# Patient Record
Sex: Male | Born: 1955 | Race: Black or African American | Hispanic: No | Marital: Married | Smoking: Never smoker
Health system: Southern US, Community
[De-identification: ages and names within clinical notes are randomized; demographics above are authoritative.]

## PROBLEM LIST (undated history)

## (undated) DIAGNOSIS — I639 Cerebral infarction, unspecified: Secondary | ICD-10-CM

## (undated) SURGERY — Surgical Case
Anesthesia: *Unknown

---

## 1999-10-12 ENCOUNTER — Emergency Department (HOSPITAL_COMMUNITY): Admission: EM | Admit: 1999-10-12 | Discharge: 1999-10-12 | Payer: Self-pay | Admitting: Emergency Medicine

## 1999-11-10 ENCOUNTER — Emergency Department (HOSPITAL_COMMUNITY): Admission: EM | Admit: 1999-11-10 | Discharge: 1999-11-10 | Payer: Self-pay | Admitting: *Deleted

## 2002-06-14 ENCOUNTER — Emergency Department (HOSPITAL_COMMUNITY): Admission: EM | Admit: 2002-06-14 | Discharge: 2002-06-14 | Payer: Self-pay | Admitting: Emergency Medicine

## 2002-06-14 ENCOUNTER — Encounter: Payer: Self-pay | Admitting: Emergency Medicine

## 2005-04-01 ENCOUNTER — Emergency Department (HOSPITAL_COMMUNITY): Admission: EM | Admit: 2005-04-01 | Discharge: 2005-04-01 | Payer: Self-pay | Admitting: Emergency Medicine

## 2005-10-01 ENCOUNTER — Emergency Department (HOSPITAL_COMMUNITY): Admission: EM | Admit: 2005-10-01 | Discharge: 2005-10-01 | Payer: Self-pay | Admitting: Emergency Medicine

## 2005-10-04 ENCOUNTER — Encounter: Admission: RE | Admit: 2005-10-04 | Discharge: 2005-10-04 | Payer: Self-pay | Admitting: General Surgery

## 2005-10-07 ENCOUNTER — Ambulatory Visit (HOSPITAL_BASED_OUTPATIENT_CLINIC_OR_DEPARTMENT_OTHER): Admission: RE | Admit: 2005-10-07 | Discharge: 2005-10-07 | Payer: Self-pay | Admitting: General Surgery

## 2008-07-14 ENCOUNTER — Emergency Department (HOSPITAL_COMMUNITY): Admission: EM | Admit: 2008-07-14 | Discharge: 2008-07-14 | Payer: Self-pay | Admitting: Emergency Medicine

## 2008-11-21 ENCOUNTER — Encounter: Admission: RE | Admit: 2008-11-21 | Discharge: 2008-11-21 | Payer: Self-pay | Admitting: Family Medicine

## 2010-11-17 ENCOUNTER — Inpatient Hospital Stay (INDEPENDENT_AMBULATORY_CARE_PROVIDER_SITE_OTHER)
Admission: RE | Admit: 2010-11-17 | Discharge: 2010-11-17 | Disposition: A | Discharge status: Home / Self Care | Payer: Self-pay | Source: Ambulatory Visit | Attending: Family Medicine | Admitting: Family Medicine

## 2010-11-17 DIAGNOSIS — L089 Local infection of the skin and subcutaneous tissue, unspecified: Secondary | ICD-10-CM

## 2010-11-17 DIAGNOSIS — G9009 Other idiopathic peripheral autonomic neuropathy: Secondary | ICD-10-CM

## 2011-02-01 NOTE — Op Note (Signed)
NAME:  Vincent Oconnell, Vincent Oconnell NO.:  [id number]   MEDICAL RECORD NO.:  [id number]          PATIENT TYPE:  AMB   LOCATION:  [facility]                         FACILITY:  [facility]   PHYSICIAN:  Anselm Pancoast. Weatherly, M.D.DATE OF BIRTH:  Dec 27, 1955   DATE OF PROCEDURE:  10/07/2005  DATE OF DISCHARGE:                                 OPERATIVE REPORT   PREOPERATIVE DIAGNOSIS:  Right inguinal hernia, probably indirect.   POSTOPERATIVE DIAGNOSIS:  Right inguinal hernia, indirect.   OPERATION:  Right inguinal herniorrhaphy with mesh reinforcement.   ANESTHESIA:  General with local supplementation.   SURGEON:  Leodis Alcocer. Zachery Dakins, M.D.   HISTORY:  Vincent Oconnell is a 55 year old male who is actually a chef over  at the [employer] and was referred to me by his medical doctor with the  following history:  He started having discomfort about two months ago and  has been evaluated in one of the urgent care facilities.  They referred him  to Dr. Maple Hudson on [facility], who checked his cholesterol and put him  on Lipitor.  He had questionable kidney stones, and he saw Dr. Larey Dresser after they found microscopic blood in his urine.  He then was seen  in the ER with right groin pain by Dr. Adriana Simas, who noted he had a definite  right inguinal hernia and referred him to me, and I saw him last week.  He  said that when he is on his feet, he starts getting pain and discomfort and  a bulge in the right groin.  Maybe he had the bulge two months ago, but the  pain certainly has been present for about two months.  I recommended that we  proceed on with a right inguinal herniorrhaphy.  He does not smoke or drink.  He does pump weights.  He says he stopped that because of the discomfort.  He desires to have the hernia repaired, and we plan on keeping him out of  work probably 1-2 weeks.  He had a CAT scan approximately a month ago at  [place] and that did not show a hernia.  It showed the  kidney stones  bilaterally.   The patient was taken to the operating suite.  Induction of general  anesthesia via EOA tube.  The abdomen was shaved, prepped with Betadine  solution, and draped in a sterile manner.  The patient was identified,  permit signed, etc.  He had been marked on the right side.  I made a right  inguinal incision.  Sharp dissection down through the skin and subcutaneous  tissue.  A couple of small veins were identified, clamped and ligated after  being divided and then the external oblique aponeurosis was identified.  I  opened it through the external ring.  The ilioinguinal nerve was very small.  I elevated the cord structures with a Penrose.  The patient has an indirect  hernia that extended about 4 inches to the proximal cord structures, and  this was separated carefully so that a high sac ligation could be performed.  The vas and artery  and vessels were protected, and the high sac ligation  done under direct vision with an 0 Vicryl and then the short stack removed,  but I did not send it for pathology exam.  Next, he has kind of a weakened  floor, not really a direct inguinal hernia, but I repaired that with kind of  running 2-0 Prolene starting at the symphysis pubis, kind of running up and  reinforcing the internal ring and going back and tying the two ends  together.  Then a piece of mesh shaped like a sail slit laterally was  sutured to the four.  This was under the ilioinguinal nerve.  Starting at  the symphysis pubis, sutured to the inguinal ligament with a running 2-0  Vicryl.  The two tails were sutured together laterally.  I sutured it down  to the rectus fascia and the internal oblique with interrupted sutures of 2-  0 Prolene, and then closed the external oblique with a running 2-0 Vicryl.  Scarpa's fascia was closed with 3-0 Vicryl and a 4-0 Dexon subcuticular and  Benzoin and Steri-Strips on the skin.  I had blocked him with an  ilioinguinal nerve  and then also with Marcaine with Adrenaline in the floor  area, and then the subcutaneous area, all totaling about 25 cc of solution  used.  Patient was sent to the recovery room in a stable postoperative  condition after the Benzoin and Steri-Strips had been applied, and his  testicle was in its normal position.  He will be released after a short stay  in the recovery room with instructions to see me in approximately a week,  and whether he returns to work in a week or two weeks is determined by how  much discomfort he is having.  He will be under light duty for probably  about four weeks total.           ______________________________  Anselm Pancoast. Zachery Dakins, M.D.     WJW/MEDQ  D:  10/07/2005  T:  10/07/2005  Job:  [id number]   cc:   Dr. Dale [address]

## 2011-11-04 DIAGNOSIS — Z0000 Encounter for general adult medical examination without abnormal findings: Secondary | ICD-10-CM | POA: Insufficient documentation

## 2016-05-04 ENCOUNTER — Encounter (HOSPITAL_COMMUNITY): Payer: Self-pay | Admitting: *Deleted

## 2016-05-04 ENCOUNTER — Emergency Department (HOSPITAL_COMMUNITY)
Admission: EM | Admit: 2016-05-04 | Discharge: 2016-05-05 | Disposition: A | Discharge status: Home / Self Care | Payer: Worker's Compensation | Attending: Emergency Medicine | Admitting: Emergency Medicine

## 2016-05-04 DIAGNOSIS — Y9389 Activity, other specified: Secondary | ICD-10-CM | POA: Diagnosis not present

## 2016-05-04 DIAGNOSIS — S61001A Unspecified open wound of right thumb without damage to nail, initial encounter: Secondary | ICD-10-CM | POA: Diagnosis present

## 2016-05-04 DIAGNOSIS — Z23 Encounter for immunization: Secondary | ICD-10-CM | POA: Insufficient documentation

## 2016-05-04 DIAGNOSIS — S61209A Unspecified open wound of unspecified finger without damage to nail, initial encounter: Secondary | ICD-10-CM

## 2016-05-04 DIAGNOSIS — Y92511 Restaurant or cafe as the place of occurrence of the external cause: Secondary | ICD-10-CM | POA: Diagnosis not present

## 2016-05-04 DIAGNOSIS — W260XXA Contact with knife, initial encounter: Secondary | ICD-10-CM | POA: Insufficient documentation

## 2016-05-04 DIAGNOSIS — Y999 Unspecified external cause status: Secondary | ICD-10-CM | POA: Diagnosis not present

## 2016-05-04 MED ORDER — CEPHALEXIN 250 MG PO CAPS
500.0000 mg | ORAL_CAPSULE | Freq: Once | ORAL | Status: AC
  Administered 2016-05-05: 500 mg via ORAL
  Filled 2016-05-04: qty 2

## 2016-05-04 MED ORDER — CEPHALEXIN 500 MG PO CAPS: 500.0000 mg | ORAL_CAPSULE | Freq: Four times a day (QID) | ORAL | 0 refills | Status: DC

## 2016-05-04 MED ORDER — LIDOCAINE HCL (PF) 1 % IJ SOLN
5.0000 mL | Freq: Once | INTRAMUSCULAR | Status: AC
  Administered 2016-05-04: 5 mL via INTRADERMAL
  Filled 2016-05-04: qty 5

## 2016-05-04 MED ORDER — BACITRACIN ZINC 500 UNIT/GM EX OINT
1.0000 "application " | TOPICAL_OINTMENT | Freq: Once | CUTANEOUS | Status: AC
  Administered 2016-05-05: 1 via TOPICAL
  Filled 2016-05-04: qty 0.9

## 2016-05-04 MED ORDER — TETANUS-DIPHTH-ACELL PERTUSSIS 5-2.5-18.5 LF-MCG/0.5 IM SUSP
0.5000 mL | Freq: Once | INTRAMUSCULAR | Status: AC
  Administered 2016-05-05: 0.5 mL via INTRAMUSCULAR
  Filled 2016-05-04: qty 0.5

## 2016-05-04 MED ORDER — LIDOCAINE-EPINEPHRINE-TETRACAINE (LET) SOLUTION
3.0000 mL | Freq: Once | NASAL | Status: AC
  Administered 2016-05-04: 3 mL via TOPICAL
  Filled 2016-05-04: qty 3

## 2016-05-04 NOTE — ED Provider Notes (Signed)
[facility]-EMERGENCY DEPT Provider Note   CSN: [id number] Arrival date & time: 05/04/16  2107  By signing my name below, I, Rosario AdieWilliam Andrew Hiatt, attest that this documentation has been prepared under the direction and in the presence of [employer]ole , PA-C.  Electronically Signed: Rosario AdieWilliam Andrew Hiatt, ED Scribe. 05/04/16. 9:48 PM.  History   Chief Complaint Chief Complaint  Patient presents with  . Laceration   The history is provided by the patient. No language interpreter was used.   HPI Comments: Glori BickersWilliam Dinkel is a right hand dominant 60 y.o. male with no pertinent PMhx, who presents to the Emergency Department complaining of laceration to the right first digit onset ~45 minutes PTA. Bleeding is controlled with pressure. Pt reports that he was cutting pork tenderloin with a sharp knife, when he accidentally brought the knife down onto the digit, sustaining the laceration. His pain to the area is exacerbated with palpation. No hx of DM. Denies any other symptoms. Tetanus is not UTD.   History reviewed. No pertinent past medical history.  There are no active problems to display for this patient.  History reviewed. No pertinent surgical history.  Home Medications    Prior to Admission medications   Medication Sig Start Date End Date Taking? Authorizing Provider  cephALEXin (KEFLEX) 500 MG capsule Take 1 capsule (500 mg total) by mouth 4 (four) times daily. 05/04/16   Joni ReiningNicole , PA-C   Family History No family history on file.  Social History Social History  Substance Use Topics  . Smoking status: Never Smoker  . Smokeless tobacco: Never Used  . Alcohol use No   Allergies   Review of patient's allergies indicates no known allergies.  Review of Systems Review of Systems A complete 10 system review of systems was obtained and all systems are negative except as noted in the HPI and PMH.   Physical Exam Updated Vital Signs BP 131/95 (BP Location: Right Arm)   Pulse  75   Temp 98.4 F (36.9 C)   Resp 18   Ht 5\' 7"  (1.702 m)   Wt 83.9 kg   SpO2 96%   BMI 28.98 kg/m   Physical Exam  Constitutional: He appears well-developed and well-nourished.  HENT:  Head: Normocephalic.  Eyes: Conjunctivae are normal.  Cardiovascular: Normal rate.   Pulmonary/Chest: Effort normal. No respiratory distress.  Abdominal: He exhibits no distension.  Musculoskeletal: Normal range of motion.  Partial fingertip avulsion to tip of left thumb, full range of motion, he also has a smaller puncture wound just proximal on the volar aspect of the distal phalanx. Bleeding is controlled, no gross contamination.  Neurological: He is alert.  Skin: Skin is warm and dry.  Psychiatric: He has a normal mood and affect. His behavior is normal.  Nursing note and vitals reviewed.  ED Treatments / Results  DIAGNOSTIC STUDIES: Oxygen Saturation is 98% on RA, normal by my interpretation.   COORDINATION OF CARE: 9:48 PM-Discussed next steps with pt. Pt verbalized understanding and is agreeable with the plan.   Radiology No results found.  Procedures Procedures (including critical care time)  Medications Ordered in ED Medications  lidocaine-EPINEPHrine-tetracaine (LET) solution (3 mLs Topical Given 05/04/16 2216)  lidocaine (PF) (XYLOCAINE) 1 % injection 5 mL (5 mLs Intradermal Given 05/04/16 2216)  cephALEXin (KEFLEX) capsule 500 mg (500 mg Oral Given 05/05/16 0006)  bacitracin ointment 1 application (1 application Topical Given 05/05/16 0007)  Tdap (BOOSTRIX) injection 0.5 mL (0.5 mLs Intramuscular Given 05/05/16 0006)  Initial Impression / Assessment and Plan / ED Course  I have reviewed the triage vital signs and the nursing notes.  Pertinent labs & imaging results that were available during my care of the patient were reviewed by me and considered in my medical decision making (see chart for details).  Clinical Course    Vitals:   05/04/16 2128 05/04/16 2130 05/05/16  0007  BP: 130/89  131/95  Pulse: 73  75  Resp: 16  18  Temp: 98.4 F (36.9 C)    SpO2: 98%  96%  Weight:  83.9 kg   Height:  5\' 7"  (1.702 m)     Medications  lidocaine-EPINEPHrine-tetracaine (LET) solution (3 mLs Topical Given 05/04/16 2216)  lidocaine (PF) (XYLOCAINE) 1 % injection 5 mL (5 mLs Intradermal Given 05/04/16 2216)  cephALEXin (KEFLEX) capsule 500 mg (500 mg Oral Given 05/05/16 0006)  bacitracin ointment 1 application (1 application Topical Given 05/05/16 0007)  Tdap (BOOSTRIX) injection 0.5 mL (0.5 mLs Intramuscular Given 05/05/16 0006)    Glori BickersWilliam Cuadras is 60 y.o. male presenting with Partial fingertip avulsion to left thumb, and he would not benefit from wound closure. Wound is cleaned and closed, patient will be started on antibiotics.  Evaluation does not show pathology that would require ongoing emergent intervention or inpatient treatment. Pt is hemodynamically stable and mentating appropriately. Discussed findings and plan with patient/guardian, who agrees with care plan. All questions answered. Return precautions discussed and outpatient follow up given.      Final Clinical Impressions(s) / ED Diagnoses   Final diagnoses:  Fingertip avulsion, initial encounter    I personally performed the services described in this documentation, which was scribed in my presence. The recorded information has been reviewed and is accurate.    Wynetta Emeryicole , PA-C 05/05/16 0035    Melene Planan Floyd, DO 05/05/16 1506

## 2016-05-04 NOTE — Discharge Instructions (Signed)
Wash the affected area with soap and water and apply a thin layer of topical antibiotic ointment. Do this every 12 hours.  ° °Do not use rubbing alcohol or hydrogen peroxide.                       ° °Look for signs of infection: if you see redness, if the area becomes warm, if pain increases sharply, there is discharge (pus), if red streaks appear or you develop fever or vomiting, RETURN immediately to the Emergency Department  for a recheck.  ° °

## 2016-05-04 NOTE — ED Triage Notes (Signed)
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

## 2019-06-23 ENCOUNTER — Other Ambulatory Visit: Payer: Self-pay

## 2019-06-23 ENCOUNTER — Emergency Department (HOSPITAL_COMMUNITY)
Admission: EM | Admit: 2019-06-23 | Discharge: 2019-06-23 | Disposition: A | Discharge status: Home / Self Care | Payer: No Typology Code available for payment source | Attending: Emergency Medicine | Admitting: Emergency Medicine

## 2019-06-23 ENCOUNTER — Encounter (HOSPITAL_COMMUNITY): Payer: Self-pay | Admitting: Emergency Medicine

## 2019-06-23 DIAGNOSIS — S61211A Laceration without foreign body of left index finger without damage to nail, initial encounter: Secondary | ICD-10-CM | POA: Insufficient documentation

## 2019-06-23 DIAGNOSIS — Y990 Civilian activity done for income or pay: Secondary | ICD-10-CM | POA: Insufficient documentation

## 2019-06-23 DIAGNOSIS — Y929 Unspecified place or not applicable: Secondary | ICD-10-CM | POA: Insufficient documentation

## 2019-06-23 DIAGNOSIS — Y93G1 Activity, food preparation and clean up: Secondary | ICD-10-CM | POA: Diagnosis not present

## 2019-06-23 DIAGNOSIS — S61311A Laceration without foreign body of left index finger with damage to nail, initial encounter: Secondary | ICD-10-CM

## 2019-06-23 DIAGNOSIS — Z23 Encounter for immunization: Secondary | ICD-10-CM | POA: Insufficient documentation

## 2019-06-23 DIAGNOSIS — W260XXA Contact with knife, initial encounter: Secondary | ICD-10-CM | POA: Insufficient documentation

## 2019-06-23 MED ORDER — LIDOCAINE-EPINEPHRINE (PF) 2 %-1:200000 IJ SOLN
10.0000 mL | Freq: Once | INTRAMUSCULAR | Status: AC
  Administered 2019-06-23: 10 mL

## 2019-06-23 MED ORDER — TETANUS-DIPHTH-ACELL PERTUSSIS 5-2.5-18.5 LF-MCG/0.5 IM SUSP
0.5000 mL | Freq: Once | INTRAMUSCULAR | Status: AC
  Administered 2019-06-23: 21:00:00 0.5 mL via INTRAMUSCULAR
  Filled 2019-06-23: qty 0.5

## 2019-06-23 NOTE — ED Provider Notes (Signed)
[facility] EMERGENCY DEPARTMENT Provider Note   CSN: [id number] Arrival date & time: 06/23/19  1944     History   Chief Complaint Chief Complaint  Patient presents with  . Finger Injury    HPI Vincent Oconnell is a 63 y.o. male.     63 year old male presents with laceration to his left index finger.  Patient is right-hand dominant, was at work today as a [employer] when he accidentally cut the tip of his left index finger.  Patient is not on blood thinners, bleeding is controlled with pressure.  Last tetanus is unknown.  No other injuries or concerns.     History reviewed. No pertinent past medical history.  There are no active problems to display for this patient.   History reviewed. No pertinent surgical history.      Home Medications    Prior to Admission medications   Medication Sig Start Date End Date Taking? Authorizing Provider  cephALEXin (KEFLEX) 500 MG capsule Take 1 capsule (500 mg total) by mouth 4 (four) times daily. 05/04/16   Pisciotta, Mardella Layman    Family History History reviewed. No pertinent family history.  Social History Social History   Tobacco Use  . Smoking status: Never Smoker  . Smokeless tobacco: Never Used  Substance Use Topics  . Alcohol use: No  . Drug use: Not on file     Allergies   Patient has no known allergies.   Review of Systems Review of Systems  Constitutional: Negative for fever.  Musculoskeletal: Positive for myalgias.  Skin: Positive for wound.  Allergic/Immunologic: Negative for immunocompromised state.  Neurological: Negative for weakness and numbness.  Hematological: Does not bruise/bleed easily.  All other systems reviewed and are negative.    Physical Exam Updated Vital Signs BP 139/83   Pulse 80   Temp 98.9 F (37.2 C) (Oral)   Ht 5\' 8"  (1.727 m)   Wt 83.9 kg   SpO2 95%   BMI 28.13 kg/m   Physical Exam Vitals signs and nursing note reviewed.  Constitutional:       General: He is not in acute distress.    Appearance: He is well-developed. He is not diaphoretic.  HENT:     Head: Normocephalic and atraumatic.  Cardiovascular:     Pulses: Normal pulses.  Pulmonary:     Effort: Pulmonary effort is normal.  Musculoskeletal: Normal range of motion.        General: Tenderness and signs of injury present. No swelling or deformity.       Hands:  Skin:    General: Skin is warm and dry.     Findings: No erythema or rash.  Neurological:     Mental Status: He is alert and oriented to person, place, and time.     Sensory: No sensory deficit.  Psychiatric:        Behavior: Behavior normal.      ED Treatments / Results  Labs (all labs ordered are listed, but only abnormal results are displayed) Labs Reviewed - No data to display  EKG None  Radiology No results found.  Procedures . Laceration Repair  Date/Time: 06/23/2019 9:58 PM Performed by: 08/23/2019, PA-C Authorized by: Jeannie Fend, PA-C   Consent:    Consent obtained:  Verbal   Consent given by:  Patient   Risks discussed:  Infection, need for additional repair, pain, poor cosmetic result and poor wound healing   Alternatives discussed:  No treatment and delayed  treatment Universal protocol:    Procedure explained and questions answered to patient or proxy's satisfaction: yes     Relevant documents present and verified: yes     Test results available and properly labeled: yes     Imaging studies available: yes     Required blood products, implants, devices, and special equipment available: yes     Site/side marked: yes     Immediately prior to procedure, a time out was called: yes     Patient identity confirmed:  Verbally with patient Anesthesia (see MAR for exact dosages):    Anesthesia method:  Topical application   Topical anesthesia: soaked in lidocaine with epi. Laceration details:    Location:  Finger   Finger location:  L index finger   Length (cm):  1    Depth (mm):  2 Repair type:    Repair type:  Simple Pre-procedure details:    Preparation:  Patient was prepped and draped in usual sterile fashion Exploration:    Hemostasis achieved with:  Epinephrine and tourniquet   Wound exploration: wound explored through full range of motion     Wound extent: no foreign bodies/material noted, no muscle damage noted, no nerve damage noted and no tendon damage noted     Contaminated: no   Treatment:    Area cleansed with:  Saline   Amount of cleaning:  Standard   Irrigation solution:  Sterile saline Skin repair:    Repair method:  Tissue adhesive Post-procedure details:    Dressing:  Open (no dressing)   Patient tolerance of procedure:  Tolerated well, no immediate complications   (including critical care time)  Medications Ordered in ED Medications  lidocaine-EPINEPHrine (XYLOCAINE W/EPI) 2 %-1:200000 (PF) injection 10 mL (10 mLs Other Given 06/23/19 2100)  Tdap (BOOSTRIX) injection 0.5 mL (0.5 mLs Intramuscular Given 06/23/19 2100)     Initial Impression / Assessment and Plan / ED Course  I have reviewed the triage vital signs and the nursing notes.  Pertinent labs & imaging results that were available during my care of the patient were reviewed by me and considered in my medical decision making (see chart for details).  Clinical Course as of Jun 22 2158  Wed Oct 07, 715  [id number] 64 year old male he has avulsion laceration on the distal left index finger earlier today.  Tetanus updated, wound closed successfully with Dermabond.  Discussed proper wound care, patient to follow-up with Worker's Comp. in 2 days.   [LM]    Clinical Course User Index [LM] Tacy Learn, PA-C      Final Clinical Impressions(s) / ED Diagnoses   Final diagnoses:  Laceration of left index finger without foreign body with damage to nail, initial encounter    ED Discharge Orders    None       Tacy Learn, PA-C 06/23/19 2200    Drenda Freeze, MD 06/24/19 [phone]

## 2019-06-23 NOTE — ED Notes (Signed)
Discharge instructions discussed with pt. Pt verbalized understanding. Pt stable and ambulatory. No signature pad available. 

## 2019-06-23 NOTE — Discharge Instructions (Signed)
May cover with a bandaid if needed. Do not soak wound, do not apply any antibiotic ointment to area. Leave glue in place until the glue falls off. Recommend recheck with occupation health/your workers comp provider in 2 days.

## 2019-06-23 NOTE — ED Triage Notes (Signed)
Pt presents to ED from work POV. Pt c/o finger lac to 2nd finger on left hand. Pt unsure of tetanus status. Pt states pain is an 8/10.

## 2019-09-15 ENCOUNTER — Other Ambulatory Visit: Payer: Self-pay

## 2019-09-15 ENCOUNTER — Emergency Department (HOSPITAL_COMMUNITY)
Admission: EM | Admit: 2019-09-15 | Discharge: 2019-09-16 | Disposition: A | Discharge status: Home / Self Care | Payer: Managed Care, Other (non HMO) | Attending: Emergency Medicine | Admitting: Emergency Medicine

## 2019-09-15 ENCOUNTER — Encounter (HOSPITAL_COMMUNITY): Payer: Self-pay | Admitting: Emergency Medicine

## 2019-09-15 ENCOUNTER — Emergency Department (HOSPITAL_COMMUNITY): Payer: Managed Care, Other (non HMO)

## 2019-09-15 DIAGNOSIS — R509 Fever, unspecified: Secondary | ICD-10-CM | POA: Diagnosis not present

## 2019-09-15 DIAGNOSIS — J1289 Other viral pneumonia: Secondary | ICD-10-CM | POA: Insufficient documentation

## 2019-09-15 DIAGNOSIS — U071 COVID-19: Secondary | ICD-10-CM | POA: Insufficient documentation

## 2019-09-15 DIAGNOSIS — R05 Cough: Secondary | ICD-10-CM | POA: Insufficient documentation

## 2019-09-15 DIAGNOSIS — R5383 Other fatigue: Secondary | ICD-10-CM | POA: Insufficient documentation

## 2019-09-15 DIAGNOSIS — J1282 Pneumonia due to coronavirus disease 2019: Secondary | ICD-10-CM

## 2019-09-15 DIAGNOSIS — R0602 Shortness of breath: Secondary | ICD-10-CM | POA: Diagnosis present

## 2019-09-15 LAB — CBC WITH DIFFERENTIAL/PLATELET
Abs Immature Granulocytes: 0.04 10*3/uL (ref 0.00–0.07)
Basophils Absolute: 0 10*3/uL (ref 0.0–0.1)
Basophils Relative: 0 %
Eosinophils Absolute: 0.1 10*3/uL (ref 0.0–0.5)
Eosinophils Relative: 1 %
HCT: 42.2 % (ref 39.0–52.0)
Hemoglobin: 13.7 g/dL (ref 13.0–17.0)
Immature Granulocytes: 1 %
Lymphocytes Relative: 28 %
Lymphs Abs: 1.7 10*3/uL (ref 0.7–4.0)
MCH: 29.8 pg (ref 26.0–34.0)
MCHC: 32.5 g/dL (ref 30.0–36.0)
MCV: 91.7 fL (ref 80.0–100.0)
Monocytes Absolute: 0.5 10*3/uL (ref 0.1–1.0)
Monocytes Relative: 8 %
Neutro Abs: 3.9 10*3/uL (ref 1.7–7.7)
Neutrophils Relative %: 62 %
Platelets: 209 10*3/uL (ref 150–400)
RBC: 4.6 MIL/uL (ref 4.22–5.81)
RDW: 11.6 % (ref 11.5–15.5)
WBC: 6.1 10*3/uL (ref 4.0–10.5)
nRBC: 0 % (ref 0.0–0.2)

## 2019-09-15 LAB — COMPREHENSIVE METABOLIC PANEL
ALT: 30 U/L (ref 0–44)
AST: 33 U/L (ref 15–41)
Albumin: 3.3 g/dL — ABNORMAL LOW (ref 3.5–5.0)
Alkaline Phosphatase: 47 U/L (ref 38–126)
Anion gap: 11 (ref 5–15)
BUN: 10 mg/dL (ref 8–23)
CO2: 26 mmol/L (ref 22–32)
Calcium: 8.7 mg/dL — ABNORMAL LOW (ref 8.9–10.3)
Chloride: 102 mmol/L (ref 98–111)
Creatinine, Ser: 1.29 mg/dL — ABNORMAL HIGH (ref 0.61–1.24)
GFR calc Af Amer: >60 mL/min (ref >60)
GFR calc non Af Amer: 59 mL/min — ABNORMAL LOW (ref >60)
Glucose, Bld: 113 mg/dL — ABNORMAL HIGH (ref 70–99)
Potassium: 4.2 mmol/L (ref 3.5–5.1)
Sodium: 139 mmol/L (ref 135–145)
Total Bilirubin: 0.6 mg/dL (ref 0.3–1.2)
Total Protein: 7.3 g/dL (ref 6.5–8.1)

## 2019-09-15 MED ORDER — BENZONATATE 100 MG PO CAPS: 100.0000 mg | ORAL_CAPSULE | Freq: Three times a day (TID) | ORAL | 0 refills | Status: DC | PRN

## 2019-09-15 MED ORDER — BENZONATATE 100 MG PO CAPS
100.0000 mg | ORAL_CAPSULE | Freq: Once | ORAL | Status: AC
  Administered 2019-09-15: 100 mg via ORAL
  Filled 2019-09-15: qty 1

## 2019-09-15 MED ORDER — ACETAMINOPHEN 325 MG PO TABS
650.0000 mg | ORAL_TABLET | Freq: Once | ORAL | Status: AC
  Administered 2019-09-15: 22:00:00 650 mg via ORAL
  Filled 2019-09-15: qty 2

## 2019-09-15 NOTE — Discharge Instructions (Signed)
You can take Tylenol, available over-the-counter according to label instructions as needed for fever. Drink plenty of fluids. Get rechecked immediately if you have difficulty breathing or new concerning symptoms.

## 2019-09-15 NOTE — ED Notes (Signed)
Pt ambulated on pulse oximetry. Gait steady, negative for exertional dyspnea or hypoxia (SPO2 97% on ambulation).

## 2019-09-15 NOTE — ED Provider Notes (Signed)
Vincent Oconnell Provider Note   CSN: [id number] Arrival date & time: 09/15/19  1758     History Chief Complaint  Patient presents with  . Covid+ : cough/chest congestion/fatigue    Vincent Oconnell is a 63 y.o. male.  The history is provided by the patient and medical records. No language interpreter was used.   Vincent Oconnell is a 63 y.o. male who presents to the Emergency [facility] complaining of cough and fatigue. He presents to the emergency [facility] for evaluation of cough and fatigue. He tested positive for COVID-19 on December 25. Since that time he reports fevers up to 101 at home with cough, productive of phlegm. He also complains of fatigue. He has headache associated with coughing. He has been taking Mucinex, orange juice, cough drops and aspirin. He denies any nausea, vomiting, abdominal pain, leg swelling or pain. His COVID-19 infection is secondary to workplace exposure. He lives at home with his wife. He and his wife are isolating from each other and her COVID test is still pending. He has no medical problems and takes no prescription medications. He was very active prior to this infection.    History reviewed. No pertinent past medical history.  There are no problems to display for this patient.   History reviewed. No pertinent surgical history.     No family history on file.  Social History   Tobacco Use  . Smoking status: Never Smoker  . Smokeless tobacco: Never Used  Substance Use Topics  . Alcohol use: No  . Drug use: Not on file    Home Medications Prior to Admission medications   Medication Sig Start Date End Date Taking? Authorizing Provider  benzonatate (TESSALON) 100 MG capsule Take 1 capsule (100 mg total) by mouth 3 (three) times daily as needed for cough. 09/15/19   Quintella Reichert, MD  cephALEXin (KEFLEX) 500 MG capsule Take 1 capsule (500 mg total) by mouth 4 (four) times daily. 05/04/16   Pisciotta, Elmyra Ricks,  PA-C    Allergies    Patient has no known allergies.  Review of Systems   Review of Systems  All other systems reviewed and are negative.   Physical Exam Updated Vital Signs BP 113/79   Pulse 87   Temp 99.8 F (37.7 C) (Oral)   Resp 12   SpO2 97%   Physical Exam Vitals and nursing note reviewed.  Constitutional:      Appearance: He is well-developed.  HENT:     Head: Normocephalic and atraumatic.  Cardiovascular:     Rate and Rhythm: Normal rate and regular rhythm.     Heart sounds: No murmur.  Pulmonary:     Effort: Pulmonary effort is normal. No respiratory distress.     Breath sounds: Normal breath sounds.  Abdominal:     Palpations: Abdomen is soft.     Tenderness: There is no abdominal tenderness. There is no guarding or rebound.  Musculoskeletal:        General: No swelling or tenderness.  Skin:    General: Skin is warm and dry.  Neurological:     Mental Status: He is alert and oriented to person, place, and time.  Psychiatric:        Behavior: Behavior normal.     ED Results / Procedures / Treatments   Labs (all labs ordered are listed, but only abnormal results are displayed) Labs Reviewed  COMPREHENSIVE METABOLIC PANEL - Abnormal; Notable for the following components:  Result Value   Glucose, Bld 113 (*)    Creatinine, Ser 1.29 (*)    Calcium 8.7 (*)    Albumin 3.3 (*)    GFR calc non Af Amer 59 (*)    All other components within normal limits  CBC WITH DIFFERENTIAL/PLATELET    EKG EKG Interpretation  Date/Time:  Wednesday September 15 2019 19:09:54 EST Ventricular Rate:  91 PR Interval:  154 QRS Duration: 78 QT Interval:  330 QTC Calculation: 405 R Axis:   -31 Text Interpretation: Normal sinus rhythm Left axis deviation Nonspecific ST and T wave abnormality Abnormal ECG Confirmed by Tilden Fossa [phone]) on 09/15/2019 8:47:59 PM   Radiology DG Chest Portable 1 View  Result Date: 09/15/2019 CLINICAL DATA:  Cough, congestion  EXAM: PORTABLE CHEST 1 VIEW COMPARISON:  10/04/2005 FINDINGS: Patchy bilateral interstitial airspace disease peripherally. No pleural effusion or pneumothorax. Stable cardiomediastinal silhouette. No acute osseous abnormality. IMPRESSION: Patchy bilateral interstitial airspace disease peripherally concerning for atypical viral pneumonia. Electronically Signed   By: Elige Ko   On: 09/15/2019 21:50    Procedures Procedures (including critical care time)  Medications Ordered in ED Medications  acetaminophen (TYLENOL) tablet 650 mg (650 mg Oral Given 09/15/19 2155)  benzonatate (TESSALON) capsule 100 mg (100 mg Oral Given 09/15/19 2155)    ED Course  I have reviewed the triage vital signs and the nursing notes.  Pertinent labs & imaging results that were available during my care of the patient were reviewed by me and considered in my medical decision making (see chart for details).    MDM Rules/Calculators/A&P                     Patient with recent diagnosis of COVID-19 infection here for evaluation of fever, cough. He is non-toxic appearing on evaluation with no respiratory distress. Chest x-ray with patchy infiltrates consistent with COVID-19 infection. Presentation is not consistent with ACS, PE, bacterial pneumonia, CHF, myocarditis. Patient without any hypoxia or respiratory distress during his ED stay. Counseled patient on home care for COVID-19 infection with symptomatic management. Discussed outpatient follow-up and return precautions.  [name] was evaluated in Emergency [facility] on 09/16/2019 for the symptoms described in the history of present illness. He was evaluated in the context of the global COVID-19 pandemic, which necessitated consideration that the patient might be at risk for infection with the SARS-CoV-2 virus that causes COVID-19. Institutional protocols and algorithms that pertain to the evaluation of patients at risk for COVID-19 are in a state of rapid change  based on information released by regulatory bodies including the CDC and federal and state organizations. These policies and algorithms were followed during the patient's care in the ED.   Final Clinical Impression(s) / ED Diagnoses Final diagnoses:  Pneumonia due to COVID-19 virus    Rx / DC Orders ED Discharge Orders         Ordered    benzonatate (TESSALON) 100 MG capsule  3 times daily PRN     09/15/19 2313           Tilden Fossa, MD 09/16/19 0011

## 2019-09-15 NOTE — ED Triage Notes (Signed)
Patient tested positive for Covid19 last week reports loss of smell/taste , chest congestion with cough , fatigue and low grade fever .

## 2019-09-16 NOTE — ED Notes (Signed)
Patient verbalizes understanding of discharge instructions. Opportunity for questioning and answers were provided. Armband removed by staff, pt discharged from ED.  

## 2022-04-01 ENCOUNTER — Inpatient Hospital Stay (HOSPITAL_COMMUNITY): Payer: Managed Care, Other (non HMO) | Admitting: Anesthesiology

## 2022-04-01 ENCOUNTER — Emergency Department (HOSPITAL_COMMUNITY): Payer: Managed Care, Other (non HMO)

## 2022-04-01 ENCOUNTER — Inpatient Hospital Stay (HOSPITAL_COMMUNITY): Payer: Managed Care, Other (non HMO)

## 2022-04-01 ENCOUNTER — Encounter (HOSPITAL_COMMUNITY)
Admission: EM | Disposition: A | Discharge status: Home / Self Care | Payer: Self-pay | Source: Home / Self Care | Attending: Neurology

## 2022-04-01 ENCOUNTER — Inpatient Hospital Stay (HOSPITAL_COMMUNITY)
Admission: EM | Admit: 2022-04-01 | Discharge: 2022-04-04 | DRG: 024 | Disposition: A | Discharge status: Home / Self Care | Payer: Managed Care, Other (non HMO) | Attending: Neurology | Admitting: Neurology

## 2022-04-01 DIAGNOSIS — I639 Cerebral infarction, unspecified: Principal | ICD-10-CM

## 2022-04-01 DIAGNOSIS — R471 Dysarthria and anarthria: Secondary | ICD-10-CM | POA: Diagnosis present

## 2022-04-01 DIAGNOSIS — H53461 Homonymous bilateral field defects, right side: Secondary | ICD-10-CM | POA: Diagnosis present

## 2022-04-01 DIAGNOSIS — R4182 Altered mental status, unspecified: Secondary | ICD-10-CM | POA: Diagnosis not present

## 2022-04-01 DIAGNOSIS — Z79899 Other long term (current) drug therapy: Secondary | ICD-10-CM

## 2022-04-01 DIAGNOSIS — I63512 Cerebral infarction due to unspecified occlusion or stenosis of left middle cerebral artery: Secondary | ICD-10-CM

## 2022-04-01 DIAGNOSIS — Z7902 Long term (current) use of antithrombotics/antiplatelets: Secondary | ICD-10-CM | POA: Diagnosis not present

## 2022-04-01 DIAGNOSIS — I63532 Cerebral infarction due to unspecified occlusion or stenosis of left posterior cerebral artery: Secondary | ICD-10-CM | POA: Diagnosis present

## 2022-04-01 DIAGNOSIS — G8191 Hemiplegia, unspecified affecting right dominant side: Secondary | ICD-10-CM | POA: Diagnosis present

## 2022-04-01 DIAGNOSIS — M1990 Unspecified osteoarthritis, unspecified site: Secondary | ICD-10-CM | POA: Diagnosis present

## 2022-04-01 DIAGNOSIS — I959 Hypotension, unspecified: Secondary | ICD-10-CM | POA: Diagnosis not present

## 2022-04-01 DIAGNOSIS — I10 Essential (primary) hypertension: Secondary | ICD-10-CM | POA: Diagnosis present

## 2022-04-01 DIAGNOSIS — R001 Bradycardia, unspecified: Secondary | ICD-10-CM | POA: Diagnosis not present

## 2022-04-01 DIAGNOSIS — Z7982 Long term (current) use of aspirin: Secondary | ICD-10-CM

## 2022-04-01 DIAGNOSIS — R29703 NIHSS score 3: Secondary | ICD-10-CM | POA: Diagnosis present

## 2022-04-01 DIAGNOSIS — Z20822 Contact with and (suspected) exposure to covid-19: Secondary | ICD-10-CM | POA: Diagnosis present

## 2022-04-01 DIAGNOSIS — R29712 NIHSS score 12: Secondary | ICD-10-CM | POA: Diagnosis not present

## 2022-04-01 DIAGNOSIS — E785 Hyperlipidemia, unspecified: Secondary | ICD-10-CM | POA: Diagnosis present

## 2022-04-01 DIAGNOSIS — R270 Ataxia, unspecified: Secondary | ICD-10-CM | POA: Diagnosis not present

## 2022-04-01 HISTORY — PX: IR PERCUTANEOUS ART THROMBECTOMY/INFUSION INTRACRANIAL INC DIAG ANGIO: IMG6087

## 2022-04-01 HISTORY — PX: IR ANGIO VERTEBRAL SEL VERTEBRAL UNI L MOD SED: IMG5367

## 2022-04-01 HISTORY — PX: RADIOLOGY WITH ANESTHESIA: SHX6223

## 2022-04-01 HISTORY — PX: IR US GUIDE VASC ACCESS RIGHT: IMG2390

## 2022-04-01 LAB — CBC
HCT: 44.5 % (ref 39.0–52.0)
Hemoglobin: 14.8 g/dL (ref 13.0–17.0)
MCH: 30.1 pg (ref 26.0–34.0)
MCHC: 33.3 g/dL (ref 30.0–36.0)
MCV: 90.6 fL (ref 80.0–100.0)
Platelets: 188 10*3/uL (ref 150–400)
RBC: 4.91 MIL/uL (ref 4.22–5.81)
RDW: 11.9 % (ref 11.5–15.5)
WBC: 5.8 10*3/uL (ref 4.0–10.5)
nRBC: 0 % (ref 0.0–0.2)

## 2022-04-01 LAB — COMPREHENSIVE METABOLIC PANEL
ALT: 19 U/L (ref 0–44)
AST: 21 U/L (ref 15–41)
Albumin: 4.2 g/dL (ref 3.5–5.0)
Alkaline Phosphatase: 52 U/L (ref 38–126)
Anion gap: 13 (ref 5–15)
BUN: 12 mg/dL (ref 8–23)
CO2: 25 mmol/L (ref 22–32)
Calcium: 9.5 mg/dL (ref 8.9–10.3)
Chloride: 104 mmol/L (ref 98–111)
Creatinine, Ser: 1.29 mg/dL — ABNORMAL HIGH (ref 0.61–1.24)
GFR, Estimated: >60 mL/min (ref >60)
Glucose, Bld: 123 mg/dL — ABNORMAL HIGH (ref 70–99)
Potassium: 3.8 mmol/L (ref 3.5–5.1)
Sodium: 142 mmol/L (ref 135–145)
Total Bilirubin: 0.9 mg/dL (ref 0.3–1.2)
Total Protein: 7.1 g/dL (ref 6.5–8.1)

## 2022-04-01 LAB — I-STAT CHEM 8, ED
BUN: 13 mg/dL (ref 8–23)
Calcium, Ion: 1.16 mmol/L (ref 1.15–1.40)
Chloride: 103 mmol/L (ref 98–111)
Creatinine, Ser: 1.3 mg/dL — ABNORMAL HIGH (ref 0.61–1.24)
Glucose, Bld: 122 mg/dL — ABNORMAL HIGH (ref 70–99)
HCT: 46 % (ref 39.0–52.0)
Hemoglobin: 15.6 g/dL (ref 13.0–17.0)
Potassium: 3.7 mmol/L (ref 3.5–5.1)
Sodium: 141 mmol/L (ref 135–145)
TCO2: 26 mmol/L (ref 22–32)

## 2022-04-01 LAB — PROTIME-INR
INR: 1 (ref 0.8–1.2)
Prothrombin Time: 13.4 seconds (ref 11.4–15.2)

## 2022-04-01 LAB — DIFFERENTIAL
Abs Immature Granulocytes: 0.01 10*3/uL (ref 0.00–0.07)
Basophils Absolute: 0 10*3/uL (ref 0.0–0.1)
Basophils Relative: 1 %
Eosinophils Absolute: 0.1 10*3/uL (ref 0.0–0.5)
Eosinophils Relative: 2 %
Immature Granulocytes: 0 %
Lymphocytes Relative: 53 %
Lymphs Abs: 3.1 10*3/uL (ref 0.7–4.0)
Monocytes Absolute: 0.5 10*3/uL (ref 0.1–1.0)
Monocytes Relative: 8 %
Neutro Abs: 2.1 10*3/uL (ref 1.7–7.7)
Neutrophils Relative %: 36 %

## 2022-04-01 LAB — APTT: aPTT: 25 seconds (ref 24–36)

## 2022-04-01 LAB — GLUCOSE, CAPILLARY: Glucose-Capillary: 198 mg/dL — ABNORMAL HIGH (ref 70–99)

## 2022-04-01 LAB — ETHANOL: Alcohol, Ethyl (B): <10 mg/dL (ref <10)

## 2022-04-01 LAB — HEMOGLOBIN A1C
Hgb A1c MFr Bld: 5.9 % — ABNORMAL HIGH (ref 4.8–5.6)
Mean Plasma Glucose: 122.63 mg/dL

## 2022-04-01 LAB — RESP PANEL BY RT-PCR (FLU A&B, COVID) ARPGX2
Influenza A by PCR: NEGATIVE
Influenza B by PCR: NEGATIVE
SARS Coronavirus 2 by RT PCR: NEGATIVE

## 2022-04-01 LAB — CBG MONITORING, ED: Glucose-Capillary: 117 mg/dL — ABNORMAL HIGH (ref 70–99)

## 2022-04-01 SURGERY — RADIOLOGY WITH ANESTHESIA
Anesthesia: General

## 2022-04-01 SURGERY — IR WITH ANESTHESIA
Anesthesia: Choice

## 2022-04-01 MED ORDER — PROPOFOL 10 MG/ML IV BOLUS
INTRAVENOUS | Status: DC | PRN
  Administered 2022-04-01: 40 mg via INTRAVENOUS
  Administered 2022-04-01: 100 mg via INTRAVENOUS

## 2022-04-01 MED ORDER — STROKE: EARLY STAGES OF RECOVERY BOOK
Freq: Once | Status: AC
  Filled 2022-04-01: qty 1

## 2022-04-01 MED ORDER — ASPIRIN 81 MG PO CHEW
CHEWABLE_TABLET | ORAL | Status: AC
  Filled 2022-04-01: qty 1

## 2022-04-01 MED ORDER — IOHEXOL 350 MG/ML SOLN
100.0000 mL | Freq: Once | INTRAVENOUS | Status: AC | PRN
  Administered 2022-04-01: 100 mL via INTRAVENOUS

## 2022-04-01 MED ORDER — LIDOCAINE HCL 1 % IJ SOLN
INTRAMUSCULAR | Status: AC
  Filled 2022-04-01: qty 20

## 2022-04-01 MED ORDER — SODIUM CHLORIDE 0.9 % IV SOLN: INTRAVENOUS | Status: DC | PRN

## 2022-04-01 MED ORDER — IOHEXOL 350 MG/ML SOLN
75.0000 mL | Freq: Once | INTRAVENOUS | Status: AC | PRN
  Administered 2022-04-01: 75 mL via INTRAVENOUS

## 2022-04-01 MED ORDER — ROCURONIUM BROMIDE 100 MG/10ML IV SOLN
INTRAVENOUS | Status: DC | PRN
  Administered 2022-04-01: 50 mg via INTRAVENOUS
  Administered 2022-04-01 (×2): 20 mg via INTRAVENOUS
  Administered 2022-04-01: 10 mg via INTRAVENOUS

## 2022-04-01 MED ORDER — ACETAMINOPHEN 650 MG RE SUPP: 650.0000 mg | RECTAL | Status: DC | PRN

## 2022-04-01 MED ORDER — TENECTEPLASE FOR STROKE
0.2500 mg/kg | PACK | Freq: Once | INTRAVENOUS | Status: AC
  Administered 2022-04-01: 22 mg via INTRAVENOUS
  Filled 2022-04-01: qty 10

## 2022-04-01 MED ORDER — CLEVIDIPINE BUTYRATE 0.5 MG/ML IV EMUL
INTRAVENOUS | Status: AC
  Filled 2022-04-01: qty 50

## 2022-04-01 MED ORDER — CANGRELOR BOLUS VIA INFUSION
30.0000 ug/kg | Freq: Once | INTRAVENOUS | Status: AC
  Administered 2022-04-01: 2634 ug via INTRAVENOUS
  Filled 2022-04-01: qty 2634

## 2022-04-01 MED ORDER — SODIUM CHLORIDE 0.9 % IV SOLN
4.0000 ug/kg/min | INTRAVENOUS | Status: DC
  Administered 2022-04-01: 4 ug/kg/min via INTRAVENOUS
  Filled 2022-04-01 (×2): qty 50

## 2022-04-01 MED ORDER — IOHEXOL 300 MG/ML  SOLN
100.0000 mL | Freq: Once | INTRAMUSCULAR | Status: AC | PRN
  Administered 2022-04-02: 65 mL via INTRA_ARTERIAL

## 2022-04-01 MED ORDER — ACETAMINOPHEN 160 MG/5ML PO SOLN: 650.0000 mg | ORAL | Status: DC | PRN

## 2022-04-01 MED ORDER — CANGRELOR TETRASODIUM 50 MG IV SOLR
INTRAVENOUS | Status: AC
  Filled 2022-04-01: qty 50

## 2022-04-01 MED ORDER — SUCCINYLCHOLINE CHLORIDE 200 MG/10ML IV SOSY
PREFILLED_SYRINGE | INTRAVENOUS | Status: DC | PRN
  Administered 2022-04-01: 100 mg via INTRAVENOUS

## 2022-04-01 MED ORDER — VERAPAMIL HCL 2.5 MG/ML IV SOLN
INTRAVENOUS | Status: AC
  Filled 2022-04-01: qty 2

## 2022-04-01 MED ORDER — TICAGRELOR 90 MG PO TABS
ORAL_TABLET | ORAL | Status: AC
  Filled 2022-04-01: qty 2

## 2022-04-01 MED ORDER — ACETAMINOPHEN 325 MG PO TABS
650.0000 mg | ORAL_TABLET | ORAL | Status: DC | PRN
  Administered 2022-04-03: 650 mg via ORAL
  Filled 2022-04-01: qty 2

## 2022-04-01 MED ORDER — IOHEXOL 300 MG/ML  SOLN
100.0000 mL | Freq: Once | INTRAMUSCULAR | Status: AC | PRN
  Administered 2022-04-01: 10 mL via INTRA_ARTERIAL

## 2022-04-01 MED ORDER — ONDANSETRON HCL 4 MG/2ML IJ SOLN
INTRAMUSCULAR | Status: DC | PRN
  Administered 2022-04-01: 4 mg via INTRAVENOUS

## 2022-04-01 MED ORDER — LIDOCAINE HCL (CARDIAC) PF 50 MG/5ML IV SOSY
PREFILLED_SYRINGE | INTRAVENOUS | Status: DC | PRN
  Administered 2022-04-01: 40 mg via INTRAVENOUS

## 2022-04-01 MED ORDER — PROPOFOL 10 MG/ML IV BOLUS
INTRAVENOUS | Status: DC | PRN
  Administered 2022-04-01: 80 mg via INTRAVENOUS
  Administered 2022-04-01: 30 mg via INTRAVENOUS

## 2022-04-01 MED ORDER — SODIUM CHLORIDE 0.9 % IV SOLN: INTRAVENOUS | Status: DC

## 2022-04-01 MED ORDER — DEXAMETHASONE SODIUM PHOSPHATE 10 MG/ML IJ SOLN
INTRAMUSCULAR | Status: DC | PRN
  Administered 2022-04-01: 4 mg via INTRAVENOUS

## 2022-04-01 MED ORDER — CLEVIDIPINE BUTYRATE 0.5 MG/ML IV EMUL
0.0000 mg/h | INTRAVENOUS | Status: DC
  Administered 2022-04-01: 33 mg/h via INTRAVENOUS
  Administered 2022-04-01 (×2): 21 mg/h via INTRAVENOUS
  Administered 2022-04-02: 33 mg/h via INTRAVENOUS
  Administered 2022-04-02: 30 mg/h via INTRAVENOUS
  Administered 2022-04-02: 6 mg/h via INTRAVENOUS
  Administered 2022-04-02: 2 mg/h via INTRAVENOUS
  Administered 2022-04-02: 15 mg/h via INTRAVENOUS
  Filled 2022-04-01 (×6): qty 100
  Filled 2022-04-01: qty 50
  Filled 2022-04-01 (×2): qty 100

## 2022-04-01 MED ORDER — SUGAMMADEX SODIUM 200 MG/2ML IV SOLN
INTRAVENOUS | Status: DC | PRN
  Administered 2022-04-01: 100 mg via INTRAVENOUS
  Administered 2022-04-01: 200 mg via INTRAVENOUS

## 2022-04-01 MED ORDER — CLEVIDIPINE BUTYRATE 0.5 MG/ML IV EMUL
INTRAVENOUS | Status: DC | PRN
  Administered 2022-04-01: 2 mg/h via INTRAVENOUS

## 2022-04-01 MED ORDER — ROCURONIUM BROMIDE 100 MG/10ML IV SOLN
INTRAVENOUS | Status: DC | PRN
  Administered 2022-04-01: 20 mg via INTRAVENOUS
  Administered 2022-04-01: 50 mg via INTRAVENOUS

## 2022-04-01 MED ORDER — PANTOPRAZOLE SODIUM 40 MG IV SOLR
40.0000 mg | Freq: Every day | INTRAVENOUS | Status: DC
  Administered 2022-04-01 – 2022-04-02 (×2): 40 mg via INTRAVENOUS
  Filled 2022-04-01 (×2): qty 10

## 2022-04-01 MED ORDER — SUCCINYLCHOLINE CHLORIDE 20 MG/ML IJ SOLN
INTRAMUSCULAR | Status: DC | PRN
  Administered 2022-04-01: 120 mg via INTRAVENOUS

## 2022-04-01 MED ORDER — ACETAMINOPHEN 325 MG PO TABS
650.0000 mg | ORAL_TABLET | ORAL | Status: DC | PRN
Start: 2022-04-01 — End: 2022-04-01

## 2022-04-01 MED ORDER — PHENYLEPHRINE HCL-NACL 20-0.9 MG/250ML-% IV SOLN
INTRAVENOUS | Status: DC | PRN
  Administered 2022-04-01: 25 ug/min via INTRAVENOUS

## 2022-04-01 MED ORDER — IOHEXOL 300 MG/ML  SOLN
100.0000 mL | Freq: Once | INTRAMUSCULAR | Status: AC | PRN
  Administered 2022-04-01: 80 mL via INTRA_ARTERIAL

## 2022-04-01 MED ORDER — LIDOCAINE HCL (CARDIAC) PF 100 MG/5ML IV SOSY
PREFILLED_SYRINGE | INTRAVENOUS | Status: DC | PRN
  Administered 2022-04-01: 60 mg via INTRAVENOUS

## 2022-04-01 MED ORDER — SENNOSIDES-DOCUSATE SODIUM 8.6-50 MG PO TABS
1.0000 | ORAL_TABLET | Freq: Every evening | ORAL | Status: DC | PRN
  Administered 2022-04-03: 1 via ORAL
  Filled 2022-04-01: qty 1

## 2022-04-01 NOTE — Progress Notes (Signed)
PHARMACIST CODE STROKE RESPONSE  Notified to mix TNK at 1612 by Dr. Iver Nestle TNK preparation completed at 1613  TNK dose = 22 mg IV over 5 seconds.   Issues/delays encountered (if applicable):   Vincent Oconnell 04/01/22 4:15 PM

## 2022-04-01 NOTE — Anesthesia Preprocedure Evaluation (Addendum)
Anesthesia Evaluation  Patient identified by MRN, date of birth, ID band Patient awake    Reviewed: Allergy & Precautions, Patient's Chart, lab work & pertinent test results, Unable to perform ROS - Chart review onlyPreop documentation limited or incomplete due to emergent nature of procedure.  Airway Mallampati: II  TM Distance: >3 FB Neck ROM: Full    Dental  (+) Teeth Intact, Dental Advisory Given   Pulmonary    breath sounds clear to auscultation       Cardiovascular  Rhythm:Regular Rate:Normal     Neuro/Psych CVA    GI/Hepatic   Endo/Other    Renal/GU      Musculoskeletal   Abdominal Normal abdominal exam  (+)   Peds  Hematology   Anesthesia Other Findings   Reproductive/Obstetrics                            Anesthesia Physical Anesthesia Plan  ASA: 4 and emergent  Anesthesia Plan: General   Post-op Pain Management:    Induction: Intravenous  PONV Risk Score and Plan: 2 and Ondansetron and Dexamethasone  Airway Management Planned: Oral ETT  Additional Equipment: Arterial line  Intra-op Plan:   Post-operative Plan: Possible Post-op intubation/ventilation  Informed Consent: I have reviewed the patients History and Physical, chart, labs and discussed the procedure including the risks, benefits and alternatives for the proposed anesthesia with the patient or authorized representative who has indicated his/her understanding and acceptance.     History available from chart only and Only emergency history available  Plan Discussed with: CRNA  Anesthesia Plan Comments:        Anesthesia Quick Evaluation

## 2022-04-01 NOTE — Procedures (Signed)
INTERVENTIONAL NEURORADIOLOGY BRIEF POSTPROCEDURE NOTE  DIAGNOSTIC CEREBRAL ANGIOGRAM AND MECHANICAL THROMBECTOMY  Attending: Dr. Baldemar Lenis  Diagnosis: Fetal left PCA occlusion  Access site: RCFA  Access closure: Perclose Proglide  Anesthesia: GETA  Medication used: Refer to anesthesia documentation.  Complications: None.  Estimated blood loss: 50 mL.  Specimen: None.  Findings: Occlusion of the proximal P2 segment of the fetal left PCA. Mechanical thrombectomy performed with direct contact aspiration with complete recanalization. Small non occlusive filling defect noted. One additional aspiration pass performed with persistent filling defect and eventual reocclusion. One pass with direct contact aspiration and stent retriever performed with complete recanalization without residual filling defect. No thromboembolic or hemorrhagic complication.    The patient tolerated the procedure well without incident or complication and is in stable condition.   PLAN: - Bed rest post femoral access and TNK x 12 hours. May raise HOB after 3 hours. - SBP 120-140 mmHg.

## 2022-04-01 NOTE — Sedation Documentation (Signed)
This RN is taking over for Teresa Coombs, Charity fundraiser. CRNA present. Pt currently under anesthesia and in procedure.

## 2022-04-01 NOTE — Sedation Documentation (Signed)
Report given to Maisie Fus, Charity fundraiser. Right groin assessed, no bleeding or hematoma present. All of Maisie Fus, RN questions answered prior to this RN's departure.

## 2022-04-01 NOTE — Consult Note (Signed)
Neurology Consultation Reason for Consult: Code stroke Requesting Physician: Chaney Malling  CC: Dizziness, vision change  History is obtained from: EMS, patient and wife   HPI: Vincent Oconnell is a 66 y.o. male with a past medical history significant for arthritis, no other known medical problems and not on any medications.  He has been in his usual state of health and had been feeling well including mowing the lawn this morning.  He had gone to the gym and was working out his leg muscles.  He got up and adjusted the weights (was not in the middle of straining), when he suddenly felt dizzy and had to sit down on the weight bench.  He noted he could not see on the right side.  On EMS arrival they noted that he had significant right arm and leg weakness as well.   He initially had an NIH stroke scale of 3 (partial hemianopia on the right side, right leg drift, mild dysarthria, one-point each).  TNK checklist was reviewed and patient was consented with appropriate delivery of TNK after negative head CT.  Subsequently he developed worsening symptoms with an NIH as high as 12 (2 points for drowsiness, 2 points for full right hemianopia, 3 points for right arm weakness, 3 points for right leg weakness, one-point for sensory loss on the right side, one-point for dysarthria).  Head CT was repeated and did not show any hemorrhage. Initial CTA was poor diagnostic quality and therefore was repeated and demonstrated left P2 occlusion for which risks and benefits of thrombectomy were discussed with wife.  She agreed that he would want thrombectomy and patient also assented.   LKW: 3 PM tPA given?:  Yes, 1614 IA performed?:  Yes, see IR note for groin puncture time Premorbid modified rankin scale:      0 - No symptoms.  ROS: All other review of systems was negative except as noted in the HPI.   No past medical history on file.   No family history on file.   Social History:  reports that he has never smoked.  He has never used smokeless tobacco. He reports that he does not drink alcohol. No history on file for drug use. Patient and wife deny any substance use  Exam: Current vital signs: Wt 87.8 kg   BMI 29.43 kg/m  Vital signs in last 24 hours: Weight:  [87.8 kg] 87.8 kg (07/17 1600)   Physical Exam  Constitutional: Appears well-developed and well-nourished, muscular.  Psych: Affect appropriate to situation, calm and cooperative Eyes: No scleral injection HENT: No oropharyngeal obstruction.  MSK: no joint deformities.  Cardiovascular: Normal rate and regular rhythm. Perfusing extremities well Respiratory: Effort normal, non-labored breathing GI: Soft.  No distension. There is no tenderness.  Skin: Warm dry and intact visible skin  Neuro: Mental Status: Patient is awake, alert, oriented to person, place, month, year, and situation. Patient is able to give a clear and coherent history. No signs of aphasia or neglect Cranial Nerves: II: Visual Fields are notable for a fluctuating right hemianopia (partial to full). Pupils are equal, round, and reactive to light.   III,IV, VI: EOMI without ptosis or diploplia.  V: Facial sensation is symmetric to light touch VII: Facial movement is symmetric.  VIII: hearing is intact to voice X: Uvula elevates symmetrically XI: Shoulder shrug is symmetric. XII: tongue is midline without atrophy or fasciculations.  Motor: Tone is normal. Bulk is normal.  Variable right upper extremity weakness (from no pronator drift up  through only trace movement), as well as variable right lower extremity weakness (from no drift to only slight movement).  No drift on the left side at any point Sensory: Initially sensation was symmetric.  Patient then developed some numbness in the right hand and arm Deep Tendon Reflexes: 2+ and symmetric in the brachioradialis Plantars: Toes are downgoing bilaterally.  Cerebellar: FNF and HKS are intact bilaterally  Gait:   Deferred in acute setting   NIHSS total 3 to 12 as documented in HPI above Performed at time of patient arrival to ED and repeated during the code stroke process as needed   I have reviewed labs in epic and the results pertinent to this consultation are:  Basic Metabolic Panel: Recent Labs  Lab 04/01/22 1600 04/01/22 1608  NA 142 141  K 3.8 3.7  CL 104 103  CO2 25  --   GLUCOSE 123* 122*  BUN 12 13  CREATININE 1.29* 1.30*  CALCIUM 9.5  --     CBC: Recent Labs  Lab 04/01/22 1600 04/01/22 1608  WBC 5.8  --   NEUTROABS 2.1  --   HGB 14.8 15.6  HCT 44.5 46.0  MCV 90.6  --   PLT 188  --     Coagulation Studies: Recent Labs    04/01/22 1600  LABPROT 13.4  INR 1.0      I have reviewed the images obtained:  Head CT without acute intracranial process Initial CTA poor quality, concern for left M2 as well as left P2 occlusions Repeat CTA with a left P2 occlusion 1. Short-segment critical stenosis/occlusion of the left PCA with reconstitution. No other definite occlusion is seen in the left PCA. 2. Otherwise patent intracranial vasculature. The previously suspected left M2 stenosis/occlusion is not seen on the current study and was likely artifactual. 3. Patent vasculature of the neck with no hemodynamically significant stenosis or occlusion.  Impression: This is a 66 year old man with no significant vascular risk factors that are known presenting with acute P2 occlusion  Recommendations: #Left PCA stroke - Stroke labs HgbA1c, fasting lipid panel, beta-2 glycoprotein antibodies and cardiolipin antibodies, UA, UDS - MRI brain 24 hours post thrombolytic - Currently undergoing thrombectomy - Frequent neuro checks - Echocardiogram - Hold antiplatelets for 24 hours post TNK - LDL goal less than 70, initiate high intensity statin if indicated based on cholesterol panel results - Risk factor modification - Telemetry monitoring - Blood pressure goal   - Post  successful uncomplicated revascularization SBP 120 - 140 for 24 hours; if complications have arisen or only partial revascularization reach out to interventionalist or neurologist on call for BP goal - PT consult, OT consult, Speech consult, unless patient is back to baseline - Admitted to stroke team  Brooke Dare MD-PhD [facility] [phone] Available 7 AM to 7 PM, outside these hours please contact Neurologist on call listed on AMION   Total critical care time: 75 minutes   Critical care time was exclusive of separately billable procedures and treating other patients.   Critical care was necessary to treat or prevent imminent or life-threatening deterioration.   Critical care was time spent personally by me on the following activities: development of treatment plan with patient and/or surrogate as well as nursing, discussions with consultants/primary team, evaluation of patient's response to treatment, examination of patient, obtaining history from patient or surrogate, ordering and performing treatments and interventions, ordering and review of laboratory studies, ordering and review of radiographic studies, and re-evaluation of patient's condition  as needed, as documented above.

## 2022-04-01 NOTE — ED Triage Notes (Signed)
Pt a rrives via [employer] from gym. Pt was working out and had a usdden vision change at 1500. Pt lost vision in R  field, upon EMS arrival pt BP 200s systolic w/ R arm and leg drift. During transport, drift has resolved in arm & leg, vision has improved but still having some symptoms on R side. 168/92, 90HR, 18g LAC

## 2022-04-01 NOTE — Anesthesia Procedure Notes (Signed)
Procedure Name: Intubation Date/Time: 04/01/2022 5:26 PM  Performed by: Edmonia Caprio, CRNAPre-anesthesia Checklist: Patient identified, Emergency Drugs available, Suction available, Patient being monitored and Timeout performed Patient Re-evaluated:Patient Re-evaluated prior to induction Oxygen Delivery Method: Circle system utilized Preoxygenation: Pre-oxygenation with 100% oxygen Induction Type: IV induction and Rapid sequence Laryngoscope Size: Miller and 2 Grade View: Grade I Tube type: Oral Tube size: 7.5 mm Number of attempts: 1 Airway Equipment and Method: Stylet Placement Confirmation: ETT inserted through vocal cords under direct vision, positive ETCO2 and breath sounds checked- equal and bilateral Secured at: 23 cm Tube secured with: Tape Dental Injury: Teeth and Oropharynx as per pre-operative assessment

## 2022-04-01 NOTE — ED Notes (Signed)
Pt transported to IR.  Belongings given to family by [place] with stroke team.

## 2022-04-01 NOTE — Progress Notes (Addendum)
Brief Neuro update:  Notified by Vincent Oconnell about change in exam with worsening RUE weakness and vision loss. Patient is s/p tNKASE and thrombectomy for L PCA P2 occlusion.  BP and rest of the vitals are within goal.  Plan: STAT CTH w/o contrast to evaluate for ICH.  Update: - Reviewed CTH which does not demonstrate hemorrhage. - Will get an IV and get a STAT CT Angio head and neck along with perfusion to evaluate for reocclusion and discuss with Neuro IR.  Update: STAT CT Angio obtained along with CT perfusion. Some delay due to only 2 CT techs availabe, multiple stroke codes and IV placement. CT Angio demonstrates reocclusion of the L PCA along with mismatch in the L PCA territory with no core.  Discussed with [name] and plan to take him back to Neuro IR for thrombectomy and stenting. I attempted to call wife several times but phone went to voice mail. Was able to reach out to the wife and discuss with her and she consented to rethrombectomy and stenting.  NIHSS components Score: Comment  1a Level of Conscious 0[x]  1[]  2[]  3[]      1b LOC Questions 0[x]  1[]  2[]       1c LOC Commands 0[x]  1[]  2[]       2 Best Gaze 0[x]  1[]  2[]       3 Visual 0[]  1[]  2[x]  3[]      4 Facial Palsy 0[x]  1[]  2[]  3[]      5a Motor Arm - left 0[x]  1[]  2[]  3[]  4[]  UN[]    5b Motor Arm - Right 0[]  1[]  2[]  3[x]  4[]  UN[]    6a Motor Leg - Left 0[x]  1[]  2[]  3[]  4[]  UN[]    6b Motor Leg - Right 0[]  1[]  2[]  3[]  4[x]  UN[]    7 Limb Ataxia 0[x]  1[]  2[]  3[]  UN[]     8 Sensory 0[]  1[x]  2[]  UN[]      9 Best Language 0[x]  1[]  2[]  3[]      10 Dysarthria 0[x]  1[]  2[]  UN[]      11 Extinct. and Inattention 0[x]  1[]  2[]       TOTAL: 10      This patient is critically ill and at significant risk of neurological worsening, death and care requires constant monitoring of vital signs, hemodynamics,respiratory and cardiac monitoring, neurological assessment, discussion with family, other specialists and medical decision  making of high complexity. I spent 40 minutes of neurocritical care time  in the care of  this patient. This was time spent independent of any time provided by nurse practitioner or PA.  Erick Blinks [facility] Pager Number [phone] 04/01/2022  10:30 PM   Erick Blinks [facility] Pager Number [phone]

## 2022-04-01 NOTE — Transfer of Care (Signed)
Immediate Anesthesia Transfer of Care Note  Patient: Vincent Oconnell  Procedure(s) Performed: RADIOLOGY WITH ANESTHESIA  Patient Location: PACU  Anesthesia Type:General  Level of Consciousness: awake, alert  and oriented  Airway & Oxygen Therapy: Patient Spontanous Breathing  Post-op Assessment: Report given to RN, Post -op Vital signs reviewed and stable and Patient moving all extremities X 4  Post vital signs: Reviewed and stable  Last Vitals:  Vitals Value Taken Time  BP 161/95 04/01/22 1907  Temp    Pulse 97 04/01/22 1910  Resp 20 04/01/22 1910  SpO2 91 % 04/01/22 1910  Vitals shown include unvalidated device data.  Last Pain: There were no vitals filed for this visit.       Complications: No notable events documented.

## 2022-04-01 NOTE — H&P (Signed)
Please see consult note from admit date

## 2022-04-01 NOTE — Anesthesia Procedure Notes (Signed)
Procedure Name: Intubation Date/Time: 04/01/2022 10:52 PM  Performed by: Edmonia Caprio, CRNAPre-anesthesia Checklist: Patient identified, Emergency Drugs available, Suction available, Patient being monitored and Timeout performed Patient Re-evaluated:Patient Re-evaluated prior to induction Oxygen Delivery Method: Circle system utilized Preoxygenation: Pre-oxygenation with 100% oxygen Induction Type: IV induction and Rapid sequence Laryngoscope Size: Miller and 2 Grade View: Grade I Tube type: Oral Tube size: 7.5 mm Number of attempts: 1 Airway Equipment and Method: Stylet Placement Confirmation: ETT inserted through vocal cords under direct vision, positive ETCO2 and breath sounds checked- equal and bilateral Secured at: 22 cm Tube secured with: Tape Dental Injury: Teeth and Oropharynx as per pre-operative assessment

## 2022-04-01 NOTE — Progress Notes (Signed)
3 yellow metal rings 3 yellow metal bracelets 4 yellow metal necklaces  One Apple Watch  One Yellow Metal Earring  One Airpods Case with no Airpods inside  One [employer] One Pair of Sunglasses One Key  One Wallet with $33 One pair of white Tennis Shoes One pair of Green Shorts  One [employer]  One Upper Denture   Unable to locate wife - Given to Merrilee Seashore, IR RN

## 2022-04-01 NOTE — Sedation Documentation (Signed)
Sheath Removed from the right femoral artery puncture. 6Fr ProGlide used to close.

## 2022-04-01 NOTE — ED Provider Notes (Addendum)
[facility] EMERGENCY DEPARTMENT Provider Note   CSN: [id number] Arrival date & time: 04/01/22  1555     History  Chief Complaint  Patient presents with   Code Stroke    Vincent Oconnell is a 66 y.o. male here presenting with blurry vision and right arm weakness.  Patient states that he was at the [facility] working out his legs.  Around 3 PM he had a cute onset of blurry vision on the right side.  He states that he is unable to see on the right side of his visual field.  He also had sudden onset of right-sided weakness.  Denies any neck pain  The history is provided by the patient.       Home Medications Prior to Admission medications   Medication Sig Start Date End Date Taking? Authorizing Provider  benzonatate (TESSALON) 100 MG capsule Take 1 capsule (100 mg total) by mouth 3 (three) times daily as needed for cough. 09/15/19   Tilden Fossa, MD  cephALEXin (KEFLEX) 500 MG capsule Take 1 capsule (500 mg total) by mouth 4 (four) times daily. 05/04/16   Pisciotta, Joni Reining, PA-C      Allergies    Patient has no known allergies.    Review of Systems   Review of Systems  Eyes:  Positive for visual disturbance.  Neurological:  Positive for weakness.  All other systems reviewed and are negative.   Physical Exam Updated Vital Signs Wt 87.8 kg   BMI 29.43 kg/m  Physical Exam Vitals and nursing note reviewed.  HENT:     Head: Normocephalic.     Nose: Nose normal.     Mouth/Throat:     Mouth: Mucous membranes are moist.  Eyes:     Extraocular Movements: Extraocular movements intact.     Pupils: Pupils are equal, round, and reactive to light.  Cardiovascular:     Rate and Rhythm: Normal rate and regular rhythm.     Pulses: Normal pulses.     Heart sounds: Normal heart sounds.  Pulmonary:     Effort: Pulmonary effort is normal.     Breath sounds: Normal breath sounds.  Abdominal:     General: Abdomen is flat.     Palpations: Abdomen is soft.   Musculoskeletal:        General: Normal range of motion.     Cervical back: Normal range of motion and neck supple.  Skin:    General: Skin is warm.     Capillary Refill: Capillary refill takes less than 2 seconds.  Neurological:     Mental Status: He is alert.     Comments: Right visual field cut, patient has 4 of strength right arm and leg and 5 out of 5 strength left arm and leg.  No obvious facial droop.  Psychiatric:        Mood and Affect: Mood normal.        Behavior: Behavior normal.     ED Results / Procedures / Treatments   Labs (all labs ordered are listed, but only abnormal results are displayed) Labs Reviewed  I-STAT CHEM 8, ED - Abnormal; Notable for the following components:      Result Value   Creatinine, Ser 1.30 (*)    Glucose, Bld 122 (*)    All other components within normal limits  CBG MONITORING, ED - Abnormal; Notable for the following components:   Glucose-Capillary 117 (*)    All other components within normal limits  RESP  PANEL BY RT-PCR (FLU A&B, COVID) ARPGX2  PROTIME-INR  APTT  ETHANOL  CBC  DIFFERENTIAL  COMPREHENSIVE METABOLIC PANEL  RAPID URINE DRUG SCREEN, HOSP PERFORMED  URINALYSIS, ROUTINE W REFLEX MICROSCOPIC    EKG None  Radiology No results found.  Procedures Procedures    CRITICAL CARE Performed by: Richardean Canal   Total critical care time: 30 minutes  Critical care time was exclusive of separately billable procedures and treating other patients.  Critical care was necessary to treat or prevent imminent or life-threatening deterioration.  Critical care was time spent personally by me on the following activities: development of treatment plan with patient and/or surrogate as well as nursing, discussions with consultants, evaluation of patient's response to treatment, examination of patient, obtaining history from patient or surrogate, ordering and performing treatments and interventions, ordering and review of laboratory  studies, ordering and review of radiographic studies, pulse oximetry and re-evaluation of patient's condition.   Medications Ordered in ED Medications  iohexol (OMNIPAQUE) 350 MG/ML injection 100 mL (100 mLs Intravenous Contrast Given 04/01/22 1625)  tenecteplase (TNKASE) injection for Stroke 22 mg (22 mg Intravenous Given 04/01/22 1614)    ED Course/ Medical Decision Making/ A&P                           Medical Decision Making Vincent Oconnell is a 66 y.o. male here presenting with right visual field cut and also right-sided weakness.  Patient is within tPA window.  Code stroke was activated by EMS  4:37 PM CT showed no bleeding.  Patient was given TNK by neurology.  Will admit to neuro ICU   5:20 pm I was called by the nurse because patient had episode of altered mental status after given TNK.  Patient went back to the CT scan and there was no intracranial hemorrhage.  However patient does have an LVO.  Patient went emergently to IR for IR tPA.  Patient will be transferred up to the neuro ICU afterwards  Problems Addressed: Cerebrovascular accident (CVA), unspecified mechanism (HCC): acute illness or injury  Amount and/or Complexity of Data Reviewed Labs: ordered. Decision-making details documented in ED Course. Radiology: ordered and independent interpretation performed. Decision-making details documented in ED Course. ECG/medicine tests: ordered and independent interpretation performed. Decision-making details documented in ED Course.  Risk Prescription drug management. Decision regarding hospitalization.    Final Clinical Impression(s) / ED Diagnoses Final diagnoses:  Cerebrovascular accident (CVA), unspecified mechanism [facility])    Rx / DC Orders ED Discharge Orders     None         Charlynne Pander, MD 04/01/22 1638    Charlynne Pander, MD 04/01/22 1744

## 2022-04-01 NOTE — Code Documentation (Signed)
Stroke Response Nurse Documentation Code Documentation  Vincent Oconnell is a 66 y.o. male arriving to [facility]  via [facility] on 04/01/2022 with past medical hx of arthritis.  Code stroke was activated by EMS.   Patient from [facility] where he was LKW at 1515 and now complaining of right sided vision loss and dizziness. Pt reports that he finished a set in the gym and sat down. After he sat down, he had a sudden onset of dizziness and right sided vision loss. Called EMS and they activated a Code Stroke.   Stroke team at the bedside on patient arrival. Labs drawn and patient cleared for CT by Dr. Silverio Lay. Patient to CT with team. NIHSS 3, see documentation for details and code stroke times. Patient with right hemianopia, right leg weakness, and dysarthria  on exam. The following imaging was completed:  CT Head and CTA. Patient is a candidate for IV Thrombolytic and given in CT.   1630: Patient taken back to the room and placed on the monitor.  1640: While Talking with the patient in the room, patient reported right sided arm numbess. During examination, the patient was noted to have increased weakness on the right side and becoming drosy. MD Bhagat at the bedside. Verbal order to return to CT.   1645 Patient in CT. Repeat Head and CTA completed due to new symptoms. CTA read by MD Baldemar Lenis.   [id number] Decision made to activate IR.   1712 Patient arrived in IR Suite and Handoff given to Merrilee Seashore, RN    Lucila Maine  Stroke Response RN

## 2022-04-02 ENCOUNTER — Inpatient Hospital Stay (HOSPITAL_COMMUNITY): Payer: Managed Care, Other (non HMO)

## 2022-04-02 ENCOUNTER — Encounter (HOSPITAL_COMMUNITY): Payer: Self-pay | Admitting: Radiology

## 2022-04-02 DIAGNOSIS — I63512 Cerebral infarction due to unspecified occlusion or stenosis of left middle cerebral artery: Secondary | ICD-10-CM | POA: Diagnosis not present

## 2022-04-02 HISTORY — PX: IR CT HEAD LTD: IMG2386

## 2022-04-02 HISTORY — PX: IR PERCUTANEOUS ART THROMBECTOMY/INFUSION INTRACRANIAL INC DIAG ANGIO: IMG6087

## 2022-04-02 HISTORY — PX: IR US GUIDE VASC ACCESS RIGHT: IMG2390

## 2022-04-02 LAB — URINALYSIS, ROUTINE W REFLEX MICROSCOPIC
Bacteria, UA: NONE SEEN
Bilirubin Urine: NEGATIVE
Glucose, UA: NEGATIVE mg/dL
Ketones, ur: NEGATIVE mg/dL
Leukocytes,Ua: NEGATIVE
Nitrite: NEGATIVE
Protein, ur: NEGATIVE mg/dL
Specific Gravity, Urine: 1.044 — ABNORMAL HIGH (ref 1.005–1.030)
pH: 5 (ref 5.0–8.0)

## 2022-04-02 LAB — LIPID PANEL
Cholesterol: 251 mg/dL — ABNORMAL HIGH (ref 0–200)
HDL: 65 mg/dL (ref >40)
LDL Cholesterol: 128 mg/dL — ABNORMAL HIGH (ref 0–99)
Total CHOL/HDL Ratio: 3.9 RATIO
Triglycerides: 292 mg/dL — ABNORMAL HIGH (ref <150)
VLDL: 58 mg/dL — ABNORMAL HIGH (ref 0–40)

## 2022-04-02 LAB — RAPID URINE DRUG SCREEN, HOSP PERFORMED
Amphetamines: NOT DETECTED
Barbiturates: NOT DETECTED
Benzodiazepines: NOT DETECTED
Cocaine: NOT DETECTED
Opiates: NOT DETECTED
Tetrahydrocannabinol: NOT DETECTED

## 2022-04-02 LAB — GLUCOSE, CAPILLARY
Glucose-Capillary: 124 mg/dL — ABNORMAL HIGH (ref 70–99)
Glucose-Capillary: 147 mg/dL — ABNORMAL HIGH (ref 70–99)
Glucose-Capillary: 150 mg/dL — ABNORMAL HIGH (ref 70–99)
Glucose-Capillary: 158 mg/dL — ABNORMAL HIGH (ref 70–99)
Glucose-Capillary: 159 mg/dL — ABNORMAL HIGH (ref 70–99)
Glucose-Capillary: 174 mg/dL — ABNORMAL HIGH (ref 70–99)

## 2022-04-02 LAB — ECHOCARDIOGRAM COMPLETE
AR max vel: 3.46 cm2
AV Peak grad: 9.4 mmHg
Ao pk vel: 1.53 m/s
Area-P 1/2: 5.7 cm2
Calc EF: 63.3 %
Height: 68 in
S' Lateral: 2.9 cm
Single Plane A2C EF: 62.6 %
Single Plane A4C EF: 62.9 %
Weight: 3097.02 oz

## 2022-04-02 LAB — MRSA NEXT GEN BY PCR, NASAL: MRSA by PCR Next Gen: NOT DETECTED

## 2022-04-02 LAB — HIV ANTIBODY (ROUTINE TESTING W REFLEX): HIV Screen 4th Generation wRfx: NONREACTIVE

## 2022-04-02 MED ORDER — SUGAMMADEX SODIUM 200 MG/2ML IV SOLN
INTRAVENOUS | Status: DC | PRN
  Administered 2022-04-02: 200 mg via INTRAVENOUS

## 2022-04-02 MED ORDER — CLEVIDIPINE BUTYRATE 0.5 MG/ML IV EMUL: 0.0000 mg/h | INTRAVENOUS | Status: DC

## 2022-04-02 MED ORDER — ASPIRIN 81 MG PO CHEW
81.0000 mg | CHEWABLE_TABLET | Freq: Every day | ORAL | Status: DC
  Administered 2022-04-02 – 2022-04-04 (×3): 81 mg via ORAL
  Filled 2022-04-02 (×3): qty 1

## 2022-04-02 MED ORDER — TICAGRELOR 60 MG PO TABS
ORAL_TABLET | ORAL | Status: DC | PRN
  Administered 2022-04-01: 90 mg via ORAL

## 2022-04-02 MED ORDER — ORAL CARE MOUTH RINSE: 15.0000 mL | OROMUCOSAL | Status: DC | PRN

## 2022-04-02 MED ORDER — TICAGRELOR 90 MG PO TABS
90.0000 mg | ORAL_TABLET | Freq: Two times a day (BID) | ORAL | Status: DC
  Administered 2022-04-02 – 2022-04-04 (×4): 90 mg via ORAL
  Filled 2022-04-02 (×4): qty 1

## 2022-04-02 MED ORDER — ASPIRIN 325 MG PO TABS
ORAL_TABLET | ORAL | Status: DC | PRN
  Administered 2022-04-01: 81 mg via ORAL

## 2022-04-02 MED ORDER — CHLORHEXIDINE GLUCONATE CLOTH 2 % EX PADS
6.0000 | MEDICATED_PAD | Freq: Every day | CUTANEOUS | Status: DC
Start: 2022-04-02 — End: 2022-04-04
  Administered 2022-04-02: 6 via TOPICAL

## 2022-04-02 NOTE — Anesthesia Preprocedure Evaluation (Signed)
Anesthesia Evaluation  Patient identified by MRN, date of birth, ID band Patient awake    Reviewed: Allergy & Precautions, Patient's Chart, lab work & pertinent test results, Unable to perform ROS - Chart review onlyPreop documentation limited or incomplete due to emergent nature of procedure.  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, Residual Symptoms negative psych ROS   GI/Hepatic negative GI ROS, Neg liver ROS,   Endo/Other  negative endocrine ROS  Renal/GU negative Renal ROS  negative genitourinary   Musculoskeletal negative musculoskeletal ROS (+)   Abdominal Normal abdominal exam  (+)   Peds  Hematology negative hematology ROS (+)   Anesthesia Other Findings   Reproductive/Obstetrics                             Anesthesia Physical  Anesthesia Plan  ASA: 4 and emergent  Anesthesia Plan: General   Post-op Pain Management:    Induction: Intravenous  PONV Risk Score and Plan: 2 and Ondansetron and Dexamethasone  Airway Management Planned: Oral ETT  Additional Equipment: Arterial line  Intra-op Plan:   Post-operative Plan: Possible Post-op intubation/ventilation  Informed Consent: I have reviewed the patients History and Physical, chart, labs and discussed the procedure including the risks, benefits and alternatives for the proposed anesthesia with the patient or authorized representative who has indicated his/her understanding and acceptance.     History available from chart only and Only emergency history available  Plan Discussed with: CRNA  Anesthesia Plan Comments:          Anesthesia Quick Evaluation

## 2022-04-02 NOTE — Anesthesia Postprocedure Evaluation (Signed)
Anesthesia Post Note  Patient: Vincent Oconnell  Procedure(s) Performed: IR WITH ANESTHESIA     Patient location during evaluation: PACU Anesthesia Type: General Level of consciousness: awake and alert Pain management: pain level controlled Vital Signs Assessment: post-procedure vital signs reviewed and stable Respiratory status: spontaneous breathing, nonlabored ventilation, respiratory function stable and patient connected to nasal cannula oxygen Cardiovascular status: blood pressure returned to baseline and stable Postop Assessment: no apparent nausea or vomiting Anesthetic complications: no   No notable events documented.  Last Vitals:  Vitals:   04/02/22 0545 04/02/22 0600  BP: 122/61 114/61  Pulse: 88 97  Resp: 16 14  Temp:    SpO2: 95% 96%    Last Pain:  Vitals:   04/02/22 0400  TempSrc: Axillary                 Earl Lites P 

## 2022-04-02 NOTE — Anesthesia Procedure Notes (Signed)
Arterial Line Insertion Start/End7/17/2023 5:35 PM, 04/01/2022 5:40 PM Performed by: Shelton Silvas, MD  Patient location: Pre-op. Preanesthetic checklist: patient identified, IV checked, site marked, risks and benefits discussed, surgical consent, monitors and equipment checked, pre-op evaluation, timeout performed and anesthesia consent Lidocaine 1% used for infiltration Left, radial was placed Catheter size: 20 G Hand hygiene performed  and maximum sterile barriers used   Attempts: 1 Procedure performed without using ultrasound guided technique. Following insertion, dressing applied and Biopatch. Post procedure assessment: normal and unchanged  Patient tolerated the procedure well with no immediate complications.

## 2022-04-02 NOTE — Progress Notes (Signed)
At [phone] patient stated they felt like they were passing out and needed to throw up. Blood pressure dropped to 70s/30s, HR dropped from 80s to 50s. Cleviprex was stoppped. HR and bp trended up within 5 minutes. Patient was weak again on right side during the episode. By 0700 the patient had returned to 0600 baseline.

## 2022-04-02 NOTE — Progress Notes (Signed)
Cleviprex stopped

## 2022-04-02 NOTE — Transfer of Care (Signed)
Immediate Anesthesia Transfer of Care Note  Patient: Vincent Oconnell  Procedure(s) Performed: IR WITH ANESTHESIA  Patient Location: ICU  Anesthesia Type:General  Level of Consciousness: awake, alert  and patient cooperative  Airway & Oxygen Therapy: Patient Spontanous Breathing and Patient connected to face mask oxygen  Post-op Assessment: Report given to RN and Post -op Vital signs reviewed and stable  Post vital signs: Reviewed and stable  Last Vitals:  Vitals Value Taken Time  BP 139/80 04/02/22 0100  Temp 36.7 C 04/02/22 0050  Pulse 96 04/02/22 0104  Resp 23 04/02/22 0104  SpO2 100 % 04/02/22 0104  Vitals shown include unvalidated device data.  Last Pain:  Vitals:   04/02/22 0050  TempSrc: Axillary         Complications: No notable events documented.

## 2022-04-02 NOTE — Anesthesia Postprocedure Evaluation (Signed)
Anesthesia Post Note  Patient: Vincent Oconnell  Procedure(s) Performed: RADIOLOGY WITH ANESTHESIA     Patient location during evaluation: PACU Anesthesia Type: General Level of consciousness: awake and alert Pain management: pain level controlled Vital Signs Assessment: post-procedure vital signs reviewed and stable Respiratory status: spontaneous breathing, nonlabored ventilation, respiratory function stable and patient connected to nasal cannula oxygen Cardiovascular status: blood pressure returned to baseline and stable Postop Assessment: no apparent nausea or vomiting Anesthetic complications: no   No notable events documented.  Last Vitals:  Vitals:   04/02/22 0545 04/02/22 0600  BP: 122/61 114/61  Pulse: 88 97  Resp: 16 14  Temp:    SpO2: 95% 96%    Last Pain:  Vitals:   04/02/22 0400  TempSrc: Axillary                 Earl Lites P 

## 2022-04-02 NOTE — Progress Notes (Addendum)
STROKE TEAM PROGRESS NOTE   INTERVAL HISTORY His RN is at the bedside.  No visitors during a.m. rounds but wife returned to bedside in the afternoon .today patient reports he is feeling much better overall.  RN reports transient episode of hypotension to 70 with bradycardia  Around 0600. Now back on cleviprex drip for sustained blood pressure 481E systolic. Nurse to check swallow screen soon per our discussion. MRI is pending.  We discussed history, current symptoms, ongoing work up, diagnosis and plan of care. All questions were answered.    Vitals:   04/02/22 1148 04/02/22 1200 04/02/22 1300 04/02/22 1400  BP:  123/68 115/72 112/62  Pulse:  96 97 94  Resp:  $Remo'16 18 20  'lqwxw$ Temp: 98.9 F (37.2 C)     TempSrc: Axillary     SpO2:  94% 94% 93%  Weight:      Height:       CBC:  Recent Labs  Lab 04/01/22 1600 04/01/22 1608  WBC 5.8  --   NEUTROABS 2.1  --   HGB 14.8 15.6  HCT 44.5 46.0  MCV 90.6  --   PLT 188  --    Basic Metabolic Panel:  Recent Labs  Lab 04/01/22 1600 04/01/22 1608  NA 142 141  K 3.8 3.7  CL 104 103  CO2 25  --   GLUCOSE 123* 122*  BUN 12 13  CREATININE 1.29* 1.30*  CALCIUM 9.5  --    Lipid Panel:  Recent Labs  Lab 04/02/22 0514  CHOL 251*  TRIG 292*  HDL 65  CHOLHDL 3.9  VLDL 58*  LDLCALC 128*   HgbA1c:  Recent Labs  Lab 04/01/22 1600  HGBA1C 5.9*   Urine Drug Screen:  Recent Labs  Lab 04/02/22 0526  LABOPIA NONE DETECTED  COCAINSCRNUR NONE DETECTED  LABBENZ NONE DETECTED  AMPHETMU NONE DETECTED  THCU NONE DETECTED  LABBARB NONE DETECTED    Alcohol Level  Recent Labs  Lab 04/01/22 1600  ETH <10    IMAGING past 24 hours IR PERCUTANEOUS ART THROMBECTOMY/INFUSION INTRACRANIAL INC DIAG ANGIO  Result Date: 04/02/2022 INDICATION: 66 year old male with past medical history significant for arthritis (baseline modified Rankin scale 0) presenting with dizziness, right hemianopia, dysarthria and mild right-sided weakness. His last  known well was 3 p.m. on 04/01/2022. Initially, NIHSS was 3 with improvement to NIHSS 1. However, within short interval patient declined to NIHSS 12. CT angiogram of the head and neck showed an occlusion of the proximal fetal left P2/PCA. Head CT showed no acute infarct or hemorrhage. The was brought to our service for emergency diagnostic cerebral angiogram and mechanical thrombectomy. EXAM: DIAGNOSTIC CEREBRAL ANGIOGRAM MECHANICAL THROMBECTOMY FLAT PANEL HEAD CT COMPARISON:  CT/CT angiogram of the head and neck April 01, 2022 MEDICATIONS: No antibiotics administered. ANESTHESIA/SEDATION: The procedure was performed in the general anesthesia. CONTRAST:  90 mL of Omnipaque 300 milligram/mL FLUOROSCOPY: Radiation Exposure Index (as provided by the fluoroscopic device): 563 mGy Kerma COMPLICATIONS: None immediate. TECHNIQUE: Informed written consent was obtained from the patient's wife after a thorough discussion of the procedural risks, benefits and alternatives. All questions were addressed. Maximal Sterile Barrier Technique was utilized including caps, mask, sterile gowns, sterile gloves, sterile drape, hand hygiene and skin antiseptic. A timeout was performed prior to the initiation of the procedure. The right groin was prepped and draped in the usual sterile fashion. Using a micropuncture kit and the modified Seldinger technique, access was gained to the right common femoral artery and  an 8 [place] sheath was placed. Real-time ultrasound guidance was utilized for vascular access including the acquisition of a permanent ultrasound image documenting patency of the accessed vessel. Under fluoroscopy, a Zoom 88 guide catheter was navigated over a 6 [place] Berenstein 2 catheter and a 0.035" Terumo Glidewire into the aortic arch. The catheter was placed into the left common carotid artery and then advanced into the left internal carotid artery. The diagnostic catheter was removed. Left anterior oblique angiograms of the  head was obtained. FINDINGS: 1. Normal caliber of the right common femoral artery, adequate vascular access. 2. Fetal left PCA with occlusion of the proximal left P2 segment. 3. Patent left MCA and ACA vascular trees. PROCEDURE: Using biplane roadmap, a Zoom 55 aspiration catheter was navigated over an Aristotle 24 microguidewire into the cavernous segment of the right ICA. The aspiration catheter was then advanced to the level of occlusion and connected to an aspiration pump. Continuous aspiration was performed for 2 minutes. The guide catheter was connected to a VacLok syringe. The aspiration catheter was subsequently removed under constant aspiration. The guide catheter was aspirated for debris. Left internal carotid artery angiogram with left anterior oblique view of the head was obtained. Recanalization of the left PCA is noted with small, non flow limiting filling defect noted in the P2 segment. Delayed left internal carotid artery angiograms showed increase in size of left P2/PCA filling defect with persistent brisk anterograde flow. Using biplane roadmap, a Zoom 55 aspiration catheter was navigated over an Aristotle 24 microguidewire into the cavernous segment of the right ICA. The aspiration catheter was then advanced to the level of occlusion and connected to an aspiration pump. Continuous aspiration was performed for 2 minutes. The guide catheter was connected to a VacLok syringe. The aspiration catheter was subsequently removed under constant aspiration. The guide catheter was aspirated for debris. Left internal carotid artery angiogram with left anterior oblique view of the head was obtained. Decreased size of the filling defect noted. However, delayed angiograms showed worsening of the filling defect with distal slow flow. Using biplane roadmap guidance, a zoom 55 aspiration catheter was navigated over a phenom 21 microcatheter and a Aristotle 18 microguidewire into the cavernous segment of the left  ICA. The microcatheter was then navigated over the wire into the left P3/PCA segment. Then, a 3 x 20 mm solitaire stent retriever was deployed spanning the P2. The device was allowed to intercalated with the clot for 4 minutes. The microcatheter was removed. The aspiration catheter was advanced to the level of occlusion and connected to an aspiration pump. The thrombectomy device and aspiration catheter were removed under constant aspiration. Left internal carotid artery angiogram left anterior oblique view showed complete recanalization of the left PCA without residual filling defect. Flat panel CT of the head was obtained and post processed in a separate workstation with concurrent attending physician supervision. Selected images were sent to PACS. No evidence of hemorrhagic complication. The catheter was removed from the left internal carotid artery and then advanced into the left subclavian artery. Coaxial navigation of a 6 [place] Berenstein into the left vertebral artery was performed. Frontal angiogram of the head was obtained. Compartment patency of the left vertebral artery, basilar artery and right PCA. No opacification of the left PCA due to direct origin from the left internal carotid artery (fetal PCA). The catheter was subsequently withdrawn. Right common femoral artery angiogram was obtained in right anterior oblique view. The puncture is at the level of the common  femoral artery. The artery has normal caliber, adequate for closure device. The sheath was exchanged over the wire for a Perclose ProGlide which was utilized for access closure. Immediate hemostasis was achieved. IMPRESSION: Successful mechanical thrombectomy for treatment of a proximal occlusion of the P2 segment of the fetal left PCA. No evidence of thromboembolic or hemorrhagic complication. PLAN: Transfer to ICU for continued post stroke care. Electronically Signed   By: Pedro Earls M.D.   On: 04/02/2022 14:44   IR US  Guide Vasc Access Right  Result Date: 04/02/2022 INDICATION: 66 year old male with past medical history significant for arthritis (baseline modified Rankin scale 0) presenting with dizziness, right hemianopia, dysarthria and mild right-sided weakness. His last known well was 3 p.m. on 04/01/2022. Initially, NIHSS was 3 with improvement to NIHSS 1. However, within short interval patient declined to NIHSS 12. CT angiogram of the head and neck showed an occlusion of the proximal fetal left P2/PCA. Head CT showed no acute infarct or hemorrhage. The was brought to our service for emergency diagnostic cerebral angiogram and mechanical thrombectomy. EXAM: DIAGNOSTIC CEREBRAL ANGIOGRAM MECHANICAL THROMBECTOMY FLAT PANEL HEAD CT COMPARISON:  CT/CT angiogram of the head and neck April 01, 2022 MEDICATIONS: No antibiotics administered. ANESTHESIA/SEDATION: The procedure was performed in the general anesthesia. CONTRAST:  90 mL of Omnipaque 300 milligram/mL FLUOROSCOPY: Radiation Exposure Index (as provided by the fluoroscopic device): 356 mGy Kerma COMPLICATIONS: None immediate. TECHNIQUE: Informed written consent was obtained from the patient's wife after a thorough discussion of the procedural risks, benefits and alternatives. All questions were addressed. Maximal Sterile Barrier Technique was utilized including caps, mask, sterile gowns, sterile gloves, sterile drape, hand hygiene and skin antiseptic. A timeout was performed prior to the initiation of the procedure. The right groin was prepped and draped in the usual sterile fashion. Using a micropuncture kit and the modified Seldinger technique, access was gained to the right common femoral artery and an 8 French sheath was placed. Real-time ultrasound guidance was utilized for vascular access including the acquisition of a permanent ultrasound image documenting patency of the accessed vessel. Under fluoroscopy, a Zoom 88 guide catheter was navigated over a 6 [place]  Berenstein 2 catheter and a 0.035" Terumo Glidewire into the aortic arch. The catheter was placed into the left common carotid artery and then advanced into the left internal carotid artery. The diagnostic catheter was removed. Left anterior oblique angiograms of the head was obtained. FINDINGS: 1. Normal caliber of the right common femoral artery, adequate vascular access. 2. Fetal left PCA with occlusion of the proximal left P2 segment. 3. Patent left MCA and ACA vascular trees. PROCEDURE: Using biplane roadmap, a Zoom 55 aspiration catheter was navigated over an Aristotle 24 microguidewire into the cavernous segment of the right ICA. The aspiration catheter was then advanced to the level of occlusion and connected to an aspiration pump. Continuous aspiration was performed for 2 minutes. The guide catheter was connected to a VacLok syringe. The aspiration catheter was subsequently removed under constant aspiration. The guide catheter was aspirated for debris. Left internal carotid artery angiogram with left anterior oblique view of the head was obtained. Recanalization of the left PCA is noted with small, non flow limiting filling defect noted in the P2 segment. Delayed left internal carotid artery angiograms showed increase in size of left P2/PCA filling defect with persistent brisk anterograde flow. Using biplane roadmap, a Zoom 55 aspiration catheter was navigated over an Aristotle 24 microguidewire into the cavernous segment of the  right ICA. The aspiration catheter was then advanced to the level of occlusion and connected to an aspiration pump. Continuous aspiration was performed for 2 minutes. The guide catheter was connected to a VacLok syringe. The aspiration catheter was subsequently removed under constant aspiration. The guide catheter was aspirated for debris. Left internal carotid artery angiogram with left anterior oblique view of the head was obtained. Decreased size of the filling defect noted.  However, delayed angiograms showed worsening of the filling defect with distal slow flow. Using biplane roadmap guidance, a zoom 55 aspiration catheter was navigated over a phenom 21 microcatheter and a Aristotle 18 microguidewire into the cavernous segment of the left ICA. The microcatheter was then navigated over the wire into the left P3/PCA segment. Then, a 3 x 20 mm solitaire stent retriever was deployed spanning the P2. The device was allowed to intercalated with the clot for 4 minutes. The microcatheter was removed. The aspiration catheter was advanced to the level of occlusion and connected to an aspiration pump. The thrombectomy device and aspiration catheter were removed under constant aspiration. Left internal carotid artery angiogram left anterior oblique view showed complete recanalization of the left PCA without residual filling defect. Flat panel CT of the head was obtained and post processed in a separate workstation with concurrent attending physician supervision. Selected images were sent to PACS. No evidence of hemorrhagic complication. The catheter was removed from the left internal carotid artery and then advanced into the left subclavian artery. Coaxial navigation of a 6 [place] Berenstein into the left vertebral artery was performed. Frontal angiogram of the head was obtained. Compartment patency of the left vertebral artery, basilar artery and right PCA. No opacification of the left PCA due to direct origin from the left internal carotid artery (fetal PCA). The catheter was subsequently withdrawn. Right common femoral artery angiogram was obtained in right anterior oblique view. The puncture is at the level of the common femoral artery. The artery has normal caliber, adequate for closure device. The sheath was exchanged over the wire for a Perclose ProGlide which was utilized for access closure. Immediate hemostasis was achieved. IMPRESSION: Successful mechanical thrombectomy for treatment of  a proximal occlusion of the P2 segment of the fetal left PCA. No evidence of thromboembolic or hemorrhagic complication. PLAN: Transfer to ICU for continued post stroke care. Electronically Signed   By: Pedro Earls M.D.   On: 04/02/2022 14:44   IR ANGIO VERTEBRAL SEL VERTEBRAL UNI L MOD SED  Result Date: 04/02/2022 INDICATION: 66 year old male with past medical history significant for arthritis (baseline modified Rankin scale 0) presenting with dizziness, right hemianopia, dysarthria and mild right-sided weakness. His last known well was 3 p.m. on 04/01/2022. Initially, NIHSS was 3 with improvement to NIHSS 1. However, within short interval patient declined to NIHSS 12. CT angiogram of the head and neck showed an occlusion of the proximal fetal left P2/PCA. Head CT showed no acute infarct or hemorrhage. The was brought to our service for emergency diagnostic cerebral angiogram and mechanical thrombectomy. EXAM: DIAGNOSTIC CEREBRAL ANGIOGRAM MECHANICAL THROMBECTOMY FLAT PANEL HEAD CT COMPARISON:  CT/CT angiogram of the head and neck April 01, 2022 MEDICATIONS: No antibiotics administered. ANESTHESIA/SEDATION: The procedure was performed in the general anesthesia. CONTRAST:  90 mL of Omnipaque 300 milligram/mL FLUOROSCOPY: Radiation Exposure Index (as provided by the fluoroscopic device): 588 mGy Kerma COMPLICATIONS: None immediate. TECHNIQUE: Informed written consent was obtained from the patient's wife after a thorough discussion of the procedural risks, benefits and  alternatives. All questions were addressed. Maximal Sterile Barrier Technique was utilized including caps, mask, sterile gowns, sterile gloves, sterile drape, hand hygiene and skin antiseptic. A timeout was performed prior to the initiation of the procedure. The right groin was prepped and draped in the usual sterile fashion. Using a micropuncture kit and the modified Seldinger technique, access was gained to the right common  femoral artery and an 8 French sheath was placed. Real-time ultrasound guidance was utilized for vascular access including the acquisition of a permanent ultrasound image documenting patency of the accessed vessel. Under fluoroscopy, a Zoom 88 guide catheter was navigated over a 6 [place] Berenstein 2 catheter and a 0.035" Terumo Glidewire into the aortic arch. The catheter was placed into the left common carotid artery and then advanced into the left internal carotid artery. The diagnostic catheter was removed. Left anterior oblique angiograms of the head was obtained. FINDINGS: 1. Normal caliber of the right common femoral artery, adequate vascular access. 2. Fetal left PCA with occlusion of the proximal left P2 segment. 3. Patent left MCA and ACA vascular trees. PROCEDURE: Using biplane roadmap, a Zoom 55 aspiration catheter was navigated over an Aristotle 24 microguidewire into the cavernous segment of the right ICA. The aspiration catheter was then advanced to the level of occlusion and connected to an aspiration pump. Continuous aspiration was performed for 2 minutes. The guide catheter was connected to a VacLok syringe. The aspiration catheter was subsequently removed under constant aspiration. The guide catheter was aspirated for debris. Left internal carotid artery angiogram with left anterior oblique view of the head was obtained. Recanalization of the left PCA is noted with small, non flow limiting filling defect noted in the P2 segment. Delayed left internal carotid artery angiograms showed increase in size of left P2/PCA filling defect with persistent brisk anterograde flow. Using biplane roadmap, a Zoom 55 aspiration catheter was navigated over an Aristotle 24 microguidewire into the cavernous segment of the right ICA. The aspiration catheter was then advanced to the level of occlusion and connected to an aspiration pump. Continuous aspiration was performed for 2 minutes. The guide catheter was  connected to a VacLok syringe. The aspiration catheter was subsequently removed under constant aspiration. The guide catheter was aspirated for debris. Left internal carotid artery angiogram with left anterior oblique view of the head was obtained. Decreased size of the filling defect noted. However, delayed angiograms showed worsening of the filling defect with distal slow flow. Using biplane roadmap guidance, a zoom 55 aspiration catheter was navigated over a phenom 21 microcatheter and a Aristotle 18 microguidewire into the cavernous segment of the left ICA. The microcatheter was then navigated over the wire into the left P3/PCA segment. Then, a 3 x 20 mm solitaire stent retriever was deployed spanning the P2. The device was allowed to intercalated with the clot for 4 minutes. The microcatheter was removed. The aspiration catheter was advanced to the level of occlusion and connected to an aspiration pump. The thrombectomy device and aspiration catheter were removed under constant aspiration. Left internal carotid artery angiogram left anterior oblique view showed complete recanalization of the left PCA without residual filling defect. Flat panel CT of the head was obtained and post processed in a separate workstation with concurrent attending physician supervision. Selected images were sent to PACS. No evidence of hemorrhagic complication. The catheter was removed from the left internal carotid artery and then advanced into the left subclavian artery. Coaxial navigation of a 6 [place] Berenstein into the left  vertebral artery was performed. Frontal angiogram of the head was obtained. Compartment patency of the left vertebral artery, basilar artery and right PCA. No opacification of the left PCA due to direct origin from the left internal carotid artery (fetal PCA). The catheter was subsequently withdrawn. Right common femoral artery angiogram was obtained in right anterior oblique view. The puncture is at the  level of the common femoral artery. The artery has normal caliber, adequate for closure device. The sheath was exchanged over the wire for a Perclose ProGlide which was utilized for access closure. Immediate hemostasis was achieved. IMPRESSION: Successful mechanical thrombectomy for treatment of a proximal occlusion of the P2 segment of the fetal left PCA. No evidence of thromboembolic or hemorrhagic complication. PLAN: Transfer to ICU for continued post stroke care. Electronically Signed   By: Pedro Earls M.D.   On: 04/02/2022 14:44   IR CT Head Ltd  Result Date: 04/02/2022 INDICATION: 66 year old male with past medical history significant for arthritis (baseline modified Rankin scale 0) presenting with dizziness, right hemianopia, dysarthria and mild right-sided weakness. His last known well was 3 p.m. on 04/01/2022. Initially, NIHSS was 3 with improvement to NIHSS 1. However, within short interval patient declined to NIHSS 12. CT angiogram of the head and neck showed an occlusion of the proximal fetal left P2/PCA. Head CT showed no acute infarct or hemorrhage. The was brought to our service for emergency diagnostic cerebral angiogram and mechanical thrombectomy. EXAM: DIAGNOSTIC CEREBRAL ANGIOGRAM MECHANICAL THROMBECTOMY FLAT PANEL HEAD CT COMPARISON:  CT/CT angiogram of the head and neck April 01, 2022 MEDICATIONS: No antibiotics administered. ANESTHESIA/SEDATION: The procedure was performed in the general anesthesia. CONTRAST:  90 mL of Omnipaque 300 milligram/mL FLUOROSCOPY: Radiation Exposure Index (as provided by the fluoroscopic device): 007 mGy Kerma COMPLICATIONS: None immediate. TECHNIQUE: Informed written consent was obtained from the patient's wife after a thorough discussion of the procedural risks, benefits and alternatives. All questions were addressed. Maximal Sterile Barrier Technique was utilized including caps, mask, sterile gowns, sterile gloves, sterile drape, hand hygiene  and skin antiseptic. A timeout was performed prior to the initiation of the procedure. The right groin was prepped and draped in the usual sterile fashion. Using a micropuncture kit and the modified Seldinger technique, access was gained to the right common femoral artery and an 8 French sheath was placed. Real-time ultrasound guidance was utilized for vascular access including the acquisition of a permanent ultrasound image documenting patency of the accessed vessel. Under fluoroscopy, a Zoom 88 guide catheter was navigated over a 6 [place] Berenstein 2 catheter and a 0.035" Terumo Glidewire into the aortic arch. The catheter was placed into the left common carotid artery and then advanced into the left internal carotid artery. The diagnostic catheter was removed. Left anterior oblique angiograms of the head was obtained. FINDINGS: 1. Normal caliber of the right common femoral artery, adequate vascular access. 2. Fetal left PCA with occlusion of the proximal left P2 segment. 3. Patent left MCA and ACA vascular trees. PROCEDURE: Using biplane roadmap, a Zoom 55 aspiration catheter was navigated over an [place] 24 microguidewire into the cavernous segment of the right ICA. The aspiration catheter was then advanced to the level of occlusion and connected to an aspiration pump. Continuous aspiration was performed for 2 minutes. The guide catheter was connected to a VacLok syringe. The aspiration catheter was subsequently removed under constant aspiration. The guide catheter was aspirated for debris. Left internal carotid artery angiogram with left anterior oblique view  of the head was obtained. Recanalization of the left PCA is noted with small, non flow limiting filling defect noted in the P2 segment. Delayed left internal carotid artery angiograms showed increase in size of left P2/PCA filling defect with persistent brisk anterograde flow. Using biplane roadmap, a Zoom 55 aspiration catheter was navigated over an  [place] 24 microguidewire into the cavernous segment of the right ICA. The aspiration catheter was then advanced to the level of occlusion and connected to an aspiration pump. Continuous aspiration was performed for 2 minutes. The guide catheter was connected to a VacLok syringe. The aspiration catheter was subsequently removed under constant aspiration. The guide catheter was aspirated for debris. Left internal carotid artery angiogram with left anterior oblique view of the head was obtained. Decreased size of the filling defect noted. However, delayed angiograms showed worsening of the filling defect with distal slow flow. Using biplane roadmap guidance, a zoom 55 aspiration catheter was navigated over a phenom 21 microcatheter and a Aristotle 18 microguidewire into the cavernous segment of the left ICA. The microcatheter was then navigated over the wire into the left P3/PCA segment. Then, a 3 x 20 mm solitaire stent retriever was deployed spanning the P2. The device was allowed to intercalated with the clot for 4 minutes. The microcatheter was removed. The aspiration catheter was advanced to the level of occlusion and connected to an aspiration pump. The thrombectomy device and aspiration catheter were removed under constant aspiration. Left internal carotid artery angiogram left anterior oblique view showed complete recanalization of the left PCA without residual filling defect. Flat panel CT of the head was obtained and post processed in a separate workstation with concurrent attending physician supervision. Selected images were sent to PACS. No evidence of hemorrhagic complication. The catheter was removed from the left internal carotid artery and then advanced into the left subclavian artery. Coaxial navigation of a 6 [place] Berenstein into the left vertebral artery was performed. Frontal angiogram of the head was obtained. Compartment patency of the left vertebral artery, basilar artery and right PCA. No  opacification of the left PCA due to direct origin from the left internal carotid artery (fetal PCA). The catheter was subsequently withdrawn. Right common femoral artery angiogram was obtained in right anterior oblique view. The puncture is at the level of the common femoral artery. The artery has normal caliber, adequate for closure device. The sheath was exchanged over the wire for a Perclose ProGlide which was utilized for access closure. Immediate hemostasis was achieved. IMPRESSION: Successful mechanical thrombectomy for treatment of a proximal occlusion of the P2 segment of the fetal left PCA. No evidence of thromboembolic or hemorrhagic complication. PLAN: Transfer to ICU for continued post stroke care. Electronically Signed   By: Pedro Earls M.D.   On: 04/02/2022 14:44   ECHOCARDIOGRAM COMPLETE  Result Date: 04/02/2022    ECHOCARDIOGRAM REPORT   Patient Name:   MICHIEL SIVLEY Date of Exam: 04/02/2022 Medical Rec #:  [id number]     Height:       68.0 in Accession #:    [id number]    Weight:       193.6 lb Date of Birth:  1956-05-21     BSA:          2.016 m Patient Age:    75 years      BP:           135/77 mmHg Patient Gender: M  HR:           96 bpm. Exam Location:  Inpatient Procedure: 2D Echo, Cardiac Doppler and Color Doppler Indications:    Stroke  History:        Patient has no prior history of Echocardiogram examinations.  Sonographer:    Jyl Heinz Referring Phys: [id number] Huetter  1. Left ventricular ejection fraction, by estimation, is 60 to 65%. The left ventricle has normal function. The left ventricle has no regional wall motion abnormalities. Left ventricular diastolic parameters were normal.  2. Right ventricular systolic function is normal. The right ventricular size is normal. Tricuspid regurgitation signal is inadequate for assessing PA pressure.  3. The mitral valve is grossly normal. Trivial mitral valve regurgitation. No evidence of  mitral stenosis.  4. The aortic valve is tricuspid. Aortic valve regurgitation is not visualized. No aortic stenosis is present.  5. The inferior vena cava is normal in size with greater than 50% respiratory variability, suggesting right atrial pressure of 3 mmHg. Conclusion(s)/Recommendation(s): No intracardiac source of embolism detected on this transthoracic study. Consider a transesophageal echocardiogram to exclude cardiac source of embolism if clinically indicated. FINDINGS  Left Ventricle: Left ventricular ejection fraction, by estimation, is 60 to 65%. The left ventricle has normal function. The left ventricle has no regional wall motion abnormalities. The left ventricular internal cavity size was normal in size. There is  no left ventricular hypertrophy. Left ventricular diastolic parameters were normal. Right Ventricle: The right ventricular size is normal. No increase in right ventricular wall thickness. Right ventricular systolic function is normal. Tricuspid regurgitation signal is inadequate for assessing PA pressure. Left Atrium: Left atrial size was normal in size. Right Atrium: Right atrial size was normal in size. Pericardium: Trivial pericardial effusion is present. Mitral Valve: The mitral valve is grossly normal. Trivial mitral valve regurgitation. No evidence of mitral valve stenosis. Tricuspid Valve: The tricuspid valve is grossly normal. Tricuspid valve regurgitation is trivial. No evidence of tricuspid stenosis. Aortic Valve: The aortic valve is tricuspid. Aortic valve regurgitation is not visualized. No aortic stenosis is present. Aortic valve peak gradient measures 9.4 mmHg. Pulmonic Valve: The pulmonic valve was grossly normal. Pulmonic valve regurgitation is not visualized. No evidence of pulmonic stenosis. Aorta: The aortic root and ascending aorta are structurally normal, with no evidence of dilitation. Venous: The inferior vena cava is normal in size with greater than 50% respiratory  variability, suggesting right atrial pressure of 3 mmHg. IAS/Shunts: The atrial septum is grossly normal.  LEFT VENTRICLE PLAX 2D LVIDd:         4.40 cm      Diastology LVIDs:         2.90 cm      LV e' medial:    8.08 cm/s LV PW:         1.00 cm      LV E/e' medial:  8.7 LV IVS:        1.00 cm      LV e' lateral:   9.32 cm/s LVOT diam:     2.10 cm      LV E/e' lateral: 7.5 LV SV:         87 LV SV Index:   43 LVOT Area:     3.46 cm  LV Volumes (MOD) LV vol d, MOD A2C: 114.0 ml LV vol d, MOD A4C: 121.0 ml LV vol s, MOD A2C: 42.6 ml LV vol s, MOD A4C: 44.9 ml LV SV MOD A2C:  71.4 ml LV SV MOD A4C:     121.0 ml LV SV MOD BP:      75.5 ml RIGHT VENTRICLE             IVC RV Basal diam:  2.40 cm     IVC diam: 1.30 cm RV Mid diam:    2.10 cm RV S prime:     17.00 cm/s TAPSE (M-mode): 2.2 cm LEFT ATRIUM             Index        RIGHT ATRIUM          Index LA diam:        3.00 cm 1.49 cm/m   RA Area:     9.14 cm LA Vol (A2C):   28.8 ml 14.29 ml/m  RA Volume:   13.40 ml 6.65 ml/m LA Vol (A4C):   26.5 ml 13.15 ml/m LA Biplane Vol: 27.4 ml 13.59 ml/m  AORTIC VALVE AV Area (Vmax): 3.46 cm AV Vmax:        153.00 cm/s AV Peak Grad:   9.4 mmHg LVOT Vmax:      153.00 cm/s LVOT Vmean:     96.600 cm/s LVOT VTI:       0.250 m  AORTA Ao Root diam: 3.80 cm Ao Asc diam:  3.60 cm MITRAL VALVE MV Area (PHT): 5.70 cm    SHUNTS MV Decel Time: 133 msec    Systemic VTI:  0.25 m MV E velocity: 70.30 cm/s  Systemic Diam: 2.10 cm MV A velocity: 84.40 cm/s MV E/A ratio:  0.83 Eleonore Chiquito MD Electronically signed by Eleonore Chiquito MD Signature Date/Time: 04/02/2022/2:36:26 PM    Final    CT ANGIO HEAD NECK W WO CM  Result Date: 04/01/2022 CLINICAL DATA:  Code stroke EXAM: CT ANGIOGRAPHY HEAD AND NECK CT PERFUSION BRAIN TECHNIQUE: Multidetector CT imaging of the head and neck was performed using the standard protocol during bolus administration of intravenous contrast. Multiplanar CT image reconstructions and MIPs were obtained to  evaluate the vascular anatomy. Carotid stenosis measurements (when applicable) are obtained utilizing NASCET criteria, using the distal internal carotid diameter as the denominator. Multiphase CT imaging of the brain was performed following IV bolus contrast injection. Subsequent parametric perfusion maps were calculated using RAPID software. RADIATION DOSE REDUCTION: This exam was performed according to the departmental dose-optimization program which includes automated exposure control, adjustment of the mA and/or kV according to patient size and/or use of iterative reconstruction technique. CONTRAST:  123mL OMNIPAQUE IOHEXOL 350 MG/ML SOLN COMPARISON:  None Available. FINDINGS: CTA NECK FINDINGS SKELETON: There is no bony spinal canal stenosis. No lytic or blastic lesion. OTHER NECK: Normal pharynx, larynx and major salivary glands. No cervical lymphadenopathy. Unremarkable thyroid gland. UPPER CHEST: No pneumothorax or pleural effusion. No nodules or masses. AORTIC ARCH: There is no calcific atherosclerosis of the aortic arch. There is no aneurysm, dissection or hemodynamically significant stenosis of the visualized portion of the aorta. Conventional 3 vessel aortic branching pattern. The visualized proximal subclavian arteries are widely patent. RIGHT CAROTID SYSTEM: No dissection, occlusion or aneurysm. Mild atherosclerotic calcification at the carotid bifurcation without hemodynamically significant stenosis. LEFT CAROTID SYSTEM: No dissection, occlusion or aneurysm. Mild atherosclerotic calcification at the carotid bifurcation without hemodynamically significant stenosis. VERTEBRAL ARTERIES: Codominant configuration. Both origins are clearly patent. There is no dissection, occlusion or flow-limiting stenosis to the skull base (V1-V3 segments). CTA HEAD FINDINGS POSTERIOR CIRCULATION: --Vertebral arteries: Normal V4 segments. --Inferior cerebellar arteries: Normal. --  Basilar artery: Normal. --Superior  cerebellar arteries: Normal. --Posterior cerebral arteries (PCA): Left PCA P2 segment remains occluded. There are fetal predominant origins of both posterior cerebral arteries. ANTERIOR CIRCULATION: --Intracranial internal carotid arteries: Normal. --Anterior cerebral arteries (ACA): Normal. Both A1 segments are present. Patent anterior communicating artery (a-comm). --Middle cerebral arteries (MCA): Normal. VENOUS SINUSES: As permitted by contrast timing, patent. ANATOMIC VARIANTS: None Review of the MIP images confirms the above findings. CT Brain Perfusion Findings: ASPECTS: 10 at 5:02 p.m. on 04/01/2022 CBF (<30%) Volume: 25mL Perfusion (Tmax>6.0s) volume: 74mL Mismatch Volume: 2mL Infarction Location:No infarction. The area of ischemia is located in the left PCA territory. IMPRESSION: 1. Occlusion of the left PCA P2 segment, with 34 mL area of ischemic penumbra within the left PCA territory. No infarct by CT perfusion analysis. 2. Bilateral carotid bifurcation atherosclerosis without hemodynamically significant stenosis by NASCET criteria. Electronically Signed   By: Ulyses Jarred M.D.   On: 04/01/2022 22:36   CT CEREBRAL PERFUSION W CONTRAST  Result Date: 04/01/2022 CLINICAL DATA:  Code stroke EXAM: CT ANGIOGRAPHY HEAD AND NECK CT PERFUSION BRAIN TECHNIQUE: Multidetector CT imaging of the head and neck was performed using the standard protocol during bolus administration of intravenous contrast. Multiplanar CT image reconstructions and MIPs were obtained to evaluate the vascular anatomy. Carotid stenosis measurements (when applicable) are obtained utilizing NASCET criteria, using the distal internal carotid diameter as the denominator. Multiphase CT imaging of the brain was performed following IV bolus contrast injection. Subsequent parametric perfusion maps were calculated using RAPID software. RADIATION DOSE REDUCTION: This exam was performed according to the departmental dose-optimization program which  includes automated exposure control, adjustment of the mA and/or kV according to patient size and/or use of iterative reconstruction technique. CONTRAST:  141mL OMNIPAQUE IOHEXOL 350 MG/ML SOLN COMPARISON:  None Available. FINDINGS: CTA NECK FINDINGS SKELETON: There is no bony spinal canal stenosis. No lytic or blastic lesion. OTHER NECK: Normal pharynx, larynx and major salivary glands. No cervical lymphadenopathy. Unremarkable thyroid gland. UPPER CHEST: No pneumothorax or pleural effusion. No nodules or masses. AORTIC ARCH: There is no calcific atherosclerosis of the aortic arch. There is no aneurysm, dissection or hemodynamically significant stenosis of the visualized portion of the aorta. Conventional 3 vessel aortic branching pattern. The visualized proximal subclavian arteries are widely patent. RIGHT CAROTID SYSTEM: No dissection, occlusion or aneurysm. Mild atherosclerotic calcification at the carotid bifurcation without hemodynamically significant stenosis. LEFT CAROTID SYSTEM: No dissection, occlusion or aneurysm. Mild atherosclerotic calcification at the carotid bifurcation without hemodynamically significant stenosis. VERTEBRAL ARTERIES: Codominant configuration. Both origins are clearly patent. There is no dissection, occlusion or flow-limiting stenosis to the skull base (V1-V3 segments). CTA HEAD FINDINGS POSTERIOR CIRCULATION: --Vertebral arteries: Normal V4 segments. --Inferior cerebellar arteries: Normal. --Basilar artery: Normal. --Superior cerebellar arteries: Normal. --Posterior cerebral arteries (PCA): Left PCA P2 segment remains occluded. There are fetal predominant origins of both posterior cerebral arteries. ANTERIOR CIRCULATION: --Intracranial internal carotid arteries: Normal. --Anterior cerebral arteries (ACA): Normal. Both A1 segments are present. Patent anterior communicating artery (a-comm). --Middle cerebral arteries (MCA): Normal. VENOUS SINUSES: As permitted by contrast timing,  patent. ANATOMIC VARIANTS: None Review of the MIP images confirms the above findings. CT Brain Perfusion Findings: ASPECTS: 10 at 5:02 p.m. on 04/01/2022 CBF (<30%) Volume: 23mL Perfusion (Tmax>6.0s) volume: 66mL Mismatch Volume: 75mL Infarction Location:No infarction. The area of ischemia is located in the left PCA territory. IMPRESSION: 1. Occlusion of the left PCA P2 segment, with 34 mL area of ischemic penumbra within the left  PCA territory. No infarct by CT perfusion analysis. 2. Bilateral carotid bifurcation atherosclerosis without hemodynamically significant stenosis by NASCET criteria. Electronically Signed   By: Ulyses Jarred M.D.   On: 04/01/2022 22:36   CT HEAD WO CONTRAST (5MM)  Result Date: 04/01/2022 CLINICAL DATA:  Acute neurologic deficit EXAM: CT HEAD WITHOUT CONTRAST TECHNIQUE: Contiguous axial images were obtained from the base of the skull through the vertex without intravenous contrast. RADIATION DOSE REDUCTION: This exam was performed according to the departmental dose-optimization program which includes automated exposure control, adjustment of the mA and/or kV according to patient size and/or use of iterative reconstruction technique. COMPARISON:  None Available. FINDINGS: Brain: There is no mass, hemorrhage or extra-axial collection. The size and configuration of the ventricles and extra-axial CSF spaces are normal. The brain parenchyma is normal, without acute or chronic infarction. Vascular: No abnormal hyperdensity of the major intracranial arteries or dural venous sinuses. No intracranial atherosclerosis. Skull: The visualized skull base, calvarium and extracranial soft tissues are normal. Sinuses/Orbits: No fluid levels or advanced mucosal thickening of the visualized paranasal sinuses. No mastoid or middle ear effusion. The orbits are normal. IMPRESSION: Normal head CT. Electronically Signed   By: Ulyses Jarred M.D.   On: 04/01/2022 21:06   CT ANGIO HEAD NECK W WO CM  Result  Date: 04/01/2022 CLINICAL DATA:  Code stroke follow-up EXAM: CT ANGIOGRAPHY HEAD AND NECK TECHNIQUE: Multidetector CT imaging of the head and neck was performed using the standard protocol during bolus administration of intravenous contrast. Multiplanar CT image reconstructions and MIPs were obtained to evaluate the vascular anatomy. Carotid stenosis measurements (when applicable) are obtained utilizing NASCET criteria, using the distal internal carotid diameter as the denominator. RADIATION DOSE REDUCTION: This exam was performed according to the departmental dose-optimization program which includes automated exposure control, adjustment of the mA and/or kV according to patient size and/or use of iterative reconstruction technique. CONTRAST:  52mL OMNIPAQUE IOHEXOL 350 MG/ML SOLN COMPARISON:  Same day CTA head/neck FINDINGS: CTA NECK FINDINGS Aortic arch: The imaged aortic arch is unremarkable. The origins of the major branch vessels are patent. The subclavian arteries are patent to the level imaged. Right carotid system: The right common, internal, and external carotid arteries are patent with minimal plaque at the bifurcation but no hemodynamically significant stenosis or occlusion. There is no dissection or aneurysm. Left carotid system: The left common, internal, and external carotid arteries are patent with minimal plaque at the bifurcation but no hemodynamically significant stenosis or occlusion. There is no dissection or aneurysm. Vertebral arteries: Vertebral arteries are patent, without hemodynamically significant stenosis or occlusion. There is no dissection or aneurysm. Skeleton: There is mild multilevel degenerative change of the cervical spine. There is no acute osseous abnormality or suspicious osseous lesion. There is no visible canal hematoma. Other neck: Soft tissues of the neck are unremarkable. Upper chest: The imaged lung apices are clear. Review of the MIP images confirms the above findings CTA  HEAD FINDINGS Anterior circulation: There is mild calcified plaque in the left intracranial ICA without hemodynamically significant stenosis or occlusion. The bilateral MCAs are patent without proximal high-grade stenosis or occlusion. Specifically, the previously suspected left M2 occlusion/stenosis is not seen and was likely artifactual. The bilateral ACAs are patent. The anterior communicating artery is normal. There is no aneurysm or AVM. Posterior circulation: The bilateral V4 segments are patent. PICA is identified bilaterally. The basilar artery is patent There is a fetal origin of the left PCA. There is short-segment critical  stenosis/occlusion of the left P2 segment (7-120) with distal reconstitution. There is irregularity of the PCA distal to this point without other evidence of occlusion. The right PCA is patent. The right posterior communicating artery is also identified. There is no aneurysm or AVM. Venous sinuses: Patent. Anatomic variants: As above. Review of the MIP images confirms the above findings IMPRESSION: 1. Short-segment critical stenosis/occlusion of the left PCA with reconstitution. No other definite occlusion is seen in the left PCA. 2. Otherwise patent intracranial vasculature. The previously suspected left M2 stenosis/occlusion is not seen on the current study and was likely artifactual. 3. Patent vasculature of the neck with no hemodynamically significant stenosis or occlusion. Electronically Signed   By: Valetta Mole M.D.   On: 04/01/2022 17:20   CT HEAD WO CONTRAST (5MM)  Result Date: 04/01/2022 CLINICAL DATA:  Stroke suspected, worsening symptoms after TNK EXAM: CT HEAD WITHOUT CONTRAST TECHNIQUE: Contiguous axial images were obtained from the base of the skull through the vertex without intravenous contrast. RADIATION DOSE REDUCTION: This exam was performed according to the departmental dose-optimization program which includes automated exposure control, adjustment of the mA  and/or kV according to patient size and/or use of iterative reconstruction technique. COMPARISON:  04/01/2022 FINDINGS: Brain: No evidence of acute infarction, hemorrhage, cerebral edema, mass, mass effect, or midline shift. No hydrocephalus or extra-axial fluid collection. Vascular: No hyperdense vessel. Contrast is noted in the vasculature from recent CTA. Skull: Normal. Negative for fracture or focal lesion. Sinuses/Orbits: No acute finding. Other: The mastoid air cells are well aerated. IMPRESSION: No acute intracranial process. Electronically Signed   By: Merilyn Baba M.D.   On: 04/01/2022 17:07   CT HEAD CODE STROKE WO CONTRAST  Result Date: 04/01/2022 CLINICAL DATA:  Right eye vision loss and slurred speech EXAM: CT ANGIOGRAPHY HEAD AND NECK TECHNIQUE: Multidetector CT imaging of the head and neck was performed using the standard protocol during bolus administration of intravenous contrast. Multiplanar CT image reconstructions and MIPs were obtained to evaluate the vascular anatomy. Carotid stenosis measurements (when applicable) are obtained utilizing NASCET criteria, using the distal internal carotid diameter as the denominator. RADIATION DOSE REDUCTION: This exam was performed according to the departmental dose-optimization program which includes automated exposure control, adjustment of the mA and/or kV according to patient size and/or use of iterative reconstruction technique. CONTRAST:  122mL OMNIPAQUE IOHEXOL 350 MG/ML SOLN COMPARISON:  None Available. FINDINGS: CT HEAD FINDINGS Brain: There is no acute intracranial hemorrhage, extra-axial fluid collection, or acute infarct. Parenchymal volume is normal. The ventricles are normal in size. Gray-white differentiation is preserved. There is no mass lesion. There is no mass effect or midline shift. Vascular: No dense vessel is seen. Skull: Normal. Negative for fracture or focal lesion. Sinuses/Orbits: Imaged paranasal sinuses are clear. The globes and  orbits are unremarkable. Other: None. ASPECTS ([place] Stroke Program Early CT Score) - Ganglionic level infarction (caudate, lentiform nuclei, internal capsule, insula, M1-M3 cortex): 7 - Supraganglionic infarction (M4-M6 cortex): 3 Total score (0-10 with 10 being normal): 10 CTA NECK FINDINGS The CTA images are markedly degraded by poor bolus timing. Aortic arch: The imaged aortic arch is grossly unremarkable. The origins of the major branch vessels appear patent. The subclavian arteries appear patent to the level imaged. Right carotid system: There is minimal plaque at the carotid bifurcation. There is no definite hemodynamically significant stenosis or occlusion. There is no definite dissection or aneurysm. Left carotid system: There is scattered mild soft plaque in the left common carotid  artery without hemodynamically significant stenosis. There is minimal plaque at the bifurcation without definite hemodynamically significant stenosis or occlusion. There is no definite dissection or aneurysm. Vertebral arteries: The V1 and proximal V2 segments of the vertebral arteries are suboptimally evaluated. The distal V2 and V3 segments appear patent without definite hemodynamically significant stenosis or occlusion. There is no definite dissection or aneurysm. Skeleton: Other neck: Upper chest: Review of the MIP images confirms the above findings CTA HEAD FINDINGS Anterior circulation: There is no definite hemodynamically significant stenosis or occlusion in the intracranial ICAs Bilateral M1 segments appear grossly patent. There is possible high-grade stenosis or occlusion of a left M2 segment in the sylvian fissure (18-107, 16-14). There is no definite proximal high-grade stenosis or occlusion on the right, though evaluation is markedly suboptimal due to bolus timing. There is no definite proximal high-grade stenosis or occlusion in the anterior cerebral arteries within the above confines. There is no definite  aneurysm. Posterior circulation: The bilateral V4 segments and basilar artery are grossly patent without definite proximal high-grade stenosis or occlusion. There is short-segment high-grade stenosis/occlusion of the left P2 segment (5-47, 18-114). There is apparent reconstitution, though there is a suspected additional occlusion distally (18-133). Bilateral posterior communicating arteries are identified with a fetal origin on the left. There is no definite proximal high-grade stenosis or occlusion on the right. There is no definite aneurysm. Venous sinuses: Patent. Anatomic variants: As above. Review of the MIP images confirms the above findings IMPRESSION: 1. No acute intracranial hemorrhage or infarct.  ASPECTS is 10. 2. The CTA image quality is markedly degraded by poor bolus timing with significantly suboptimal evaluation of the distal intracranial vasculature. 3. Short-segment occlusion of the left P2 segment. There is apparent reconstitution, but there is a suspected additional occlusion distally. 4. Suspected high-grade stenosis or occlusion of a left M2 segment in the sylvian fissure. 5. No other definite proximal high-grade stenosis or occlusion in the intracranial vasculature. 6. No definite hemodynamically significant stenosis or occlusion in the vasculature of the neck within the above confines. The results of the initial noncontrast head CT were called by telephone at the time of interpretation on 04/01/2022 at 4:12 pm to provider Luciano Cutter NP, who verbally acknowledged these results. The CTA findings were discussed with Dr. Curly Shores at 4:45pm. Electronically Signed   By: Valetta Mole M.D.   On: 04/01/2022 17:02   CT ANGIO HEAD NECK W WO CM (CODE STROKE)  Result Date: 04/01/2022 CLINICAL DATA:  Right eye vision loss and slurred speech EXAM: CT ANGIOGRAPHY HEAD AND NECK TECHNIQUE: Multidetector CT imaging of the head and neck was performed using the standard protocol during bolus administration of  intravenous contrast. Multiplanar CT image reconstructions and MIPs were obtained to evaluate the vascular anatomy. Carotid stenosis measurements (when applicable) are obtained utilizing NASCET criteria, using the distal internal carotid diameter as the denominator. RADIATION DOSE REDUCTION: This exam was performed according to the departmental dose-optimization program which includes automated exposure control, adjustment of the mA and/or kV according to patient size and/or use of iterative reconstruction technique. CONTRAST:  159mL OMNIPAQUE IOHEXOL 350 MG/ML SOLN COMPARISON:  None Available. FINDINGS: CT HEAD FINDINGS Brain: There is no acute intracranial hemorrhage, extra-axial fluid collection, or acute infarct. Parenchymal volume is normal. The ventricles are normal in size. Gray-white differentiation is preserved. There is no mass lesion. There is no mass effect or midline shift. Vascular: No dense vessel is seen. Skull: Normal. Negative for fracture or focal lesion. Sinuses/Orbits: Imaged  paranasal sinuses are clear. The globes and orbits are unremarkable. Other: None. ASPECTS ([place] Stroke Program Early CT Score) - Ganglionic level infarction (caudate, lentiform nuclei, internal capsule, insula, M1-M3 cortex): 7 - Supraganglionic infarction (M4-M6 cortex): 3 Total score (0-10 with 10 being normal): 10 CTA NECK FINDINGS The CTA images are markedly degraded by poor bolus timing. Aortic arch: The imaged aortic arch is grossly unremarkable. The origins of the major branch vessels appear patent. The subclavian arteries appear patent to the level imaged. Right carotid system: There is minimal plaque at the carotid bifurcation. There is no definite hemodynamically significant stenosis or occlusion. There is no definite dissection or aneurysm. Left carotid system: There is scattered mild soft plaque in the left common carotid artery without hemodynamically significant stenosis. There is minimal plaque at the  bifurcation without definite hemodynamically significant stenosis or occlusion. There is no definite dissection or aneurysm. Vertebral arteries: The V1 and proximal V2 segments of the vertebral arteries are suboptimally evaluated. The distal V2 and V3 segments appear patent without definite hemodynamically significant stenosis or occlusion. There is no definite dissection or aneurysm. Skeleton: Other neck: Upper chest: Review of the MIP images confirms the above findings CTA HEAD FINDINGS Anterior circulation: There is no definite hemodynamically significant stenosis or occlusion in the intracranial ICAs Bilateral M1 segments appear grossly patent. There is possible high-grade stenosis or occlusion of a left M2 segment in the sylvian fissure (18-107, 16-14). There is no definite proximal high-grade stenosis or occlusion on the right, though evaluation is markedly suboptimal due to bolus timing. There is no definite proximal high-grade stenosis or occlusion in the anterior cerebral arteries within the above confines. There is no definite aneurysm. Posterior circulation: The bilateral V4 segments and basilar artery are grossly patent without definite proximal high-grade stenosis or occlusion. There is short-segment high-grade stenosis/occlusion of the left P2 segment (5-47, 18-114). There is apparent reconstitution, though there is a suspected additional occlusion distally (18-133). Bilateral posterior communicating arteries are identified with a fetal origin on the left. There is no definite proximal high-grade stenosis or occlusion on the right. There is no definite aneurysm. Venous sinuses: Patent. Anatomic variants: As above. Review of the MIP images confirms the above findings IMPRESSION: 1. No acute intracranial hemorrhage or infarct.  ASPECTS is 10. 2. The CTA image quality is markedly degraded by poor bolus timing with significantly suboptimal evaluation of the distal intracranial vasculature. 3. Short-segment  occlusion of the left P2 segment. There is apparent reconstitution, but there is a suspected additional occlusion distally. 4. Suspected high-grade stenosis or occlusion of a left M2 segment in the sylvian fissure. 5. No other definite proximal high-grade stenosis or occlusion in the intracranial vasculature. 6. No definite hemodynamically significant stenosis or occlusion in the vasculature of the neck within the above confines. The results of the initial noncontrast head CT were called by telephone at the time of interpretation on 04/01/2022 at 4:12 pm to provider Luciano Cutter NP, who verbally acknowledged these results. The CTA findings were discussed with Dr. Curly Shores at 4:45pm. Electronically Signed   By: Valetta Mole M.D.   On: 04/01/2022 17:02    PHYSICAL EXAM Constitutional: Appears well-developed and well-nourished, muscular middle-aged African-American male.  Psych: Affect appropriate to situation, calm and cooperative Eyes: No scleral injection HENT: No oropharyngeal obstruction.  MSK: no joint deformities.  Cardiovascular: Normal rate and regular rhythm. Perfusing extremities well Respiratory: Effort normal, non-labored breathing GI: Soft.  No distension. There is no tenderness.  Skin: Warm  dry and intact visible skin   Neuro: Mental Status: Patient is awake, alert, oriented to person, place, but not year.  Patient is able to give a clear and coherent history.  Diminished recall 0/3.  Able to name only 7 animals and can walk on 4 legs No signs of aphasia or neglect Cranial Nerves: II: Visual Fields are notable for a fluctuating right hemianopia (partial to full). Pupils are equal, round, and reactive to light.   III,IV, VI: EOMI without ptosis or diploplia.  V: Facial sensation is symmetric to light touch VII: Facial movement is symmetric.  VIII: hearing is intact to voice X: Uvula elevates symmetrically XI: Shoulder shrug is symmetric. XII: tongue is midline without atrophy or  fasciculations.  Motor: Tone is normal. Bulk is normal.  Variable right upper extremity weakness (from no pronator drift up through only trace movement), as well as variable right lower extremity weakness (from no drift to only slight movement).  No drift on the left side at any point Sensory: Initially sensation was symmetric but diminished in the right hemibody.  Patient then developed some numbness in the right hand and arm Deep Tendon Reflexes: 2+ and symmetric in the brachioradialis Plantars: Toes are downgoing bilaterally.  Cerebellar: FNF and HKS are intact bilaterally. Mild slowing on the right with RAMS.  Gait:  Deferred in acute setting   ASSESSMENT/PLAN Mandela Bello is a 66 y.o. male with a past medical history significant for arthritis, no other known medical problems and not on any medications.   He has been in his usual state of health and had been feeling well including mowing the lawn this morning.  He had gone to the gym and was working out his leg muscles.  He got up and adjusted the weights (was not in the middle of straining), when he suddenly felt dizzy and had to sit down on the weight bench.  He noted he could not see on the right side.  On EMS arrival they noted that he had significant right arm and leg weakness as well.    He initially had an NIH stroke scale of 3 (partial hemianopia on the right side, right leg drift, mild dysarthria, one-point each).  TNK checklist was reviewed and patient was consented with appropriate delivery of TNK after negative head CT.  Subsequently he developed worsening symptoms with an NIH as high as 12 (2 points for drowsiness, 2 points for full right hemianopia, 3 points for right arm weakness, 3 points for right leg weakness, one-point for sensory loss on the right side, one-point for dysarthria).  Head CT was repeated and did not show any hemorrhage. Initial CTA was poor diagnostic quality and therefore was repeated and demonstrated left  P2 occlusion for which risks and benefits of thrombectomy were discussed with wife.  She agreed that he would want thrombectomy and patient also assented.  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 Short-segment critical stenosis/occlusion of the left PCA with reconstitution. No other definite occlusion is seen in the left PCA. 2. Otherwise patent intracranial vasculature. The previously suspected left M2 stenosis/occlusion is not seen on the current study and was likely artifactual. 3. Patent vasculature of the neck with no hemodynamically significant stenosis or occlusion.   Post IR CT  2nd procedure: Reocclusion of the proximal left P2/PCA. One direct contact aspiration pass performed with minimal recanalization (TICI 1). Cangrelor load dose administered and a 4 x 24 mm neuroform atlas stent was deployed across the occlusion with complete recanalization. No thromboembolic or hemorrhagic complication. Occlusion of the proximal P2 segment of the fetal left PCA.   1st procedure: Mechanical thrombectomy performed with direct contact aspiration with complete recanalization. Small non occlusive filling defect noted. One additional aspiration pass performed with persistent filling defect and eventual reocclusion. One pass with direct contact aspiration and stent retriever performed with complete recanalization without residual filling defect. No thromboembolic or hemorrhagic complication.   MRI  PENDING 2D Echo 60-65%, no thrombus or shunt identified LDL 128 HgbA1c 5.9 VTE prophylaxis - Recommended     Diet   Diet regular Room service appropriate? Yes; Fluid consistency: Thin  No AC/AP prior to admission Loaded with ASA and s/p IR x2 with ticagrelor infusions *AC/AP  plan TBD after MR completed * Therapy recommendations:  PENDING, currently on bedrest with logroll until midday Continue cardiac monitoring Obtain STAT HCT for neurologic decline  Disposition:  TBD  BP management No reported history of HTN, one episode of hypotension early am and now hypertensive to 160s off drip UnStable on cleviprex drip Defer to neurovascular team initial post thrombectomy period for BP goals Long-term BP goal normotensive  Hyperlipidemia Home meds:  None known at present LDL 128, not at goal < 70 High intensity statin plan TBD  Other Stroke Risk Factors Advanced Age >/= 40   Other Active Problems   Hospital day # 1  This patient was seen and evaluated with Dr. Leonie Man. He directed the plan of care.  Charlene Brooke, NP-C     STROKE MD NOTE :  I have personally obtained history,examined this patient, reviewed notes, independently viewed imaging studies, participated in medical decision making and plan of care.ROS completed by me personally and pertinent positives fully documented  I have made any additions or clarifications directly to the above note. Agree with note above.  Patient presented with left PCA occlusion and underwent treatment with IV TNK followed by successful mechanical thrombectomy for left P2 occlusion initially but 6 hours later had neurological worsening and repeat CTA showed reocclusion of the left P2 segment of the PCA requiring second repeat thrombectomy but this time placement of a rescue left PCA stent.  He seems to be doing well but requires close neurological monitoring with strict control of blood pressure with systolic [phone] for the first 24 hours per post thrombectomy protocol.  Mobilize out of bed.  Physical occupational speech therapy consults.  Check MRI scan of the brain later today and will continue aspirin and Brilinta if no significant hemorrhagic transformation.  Long discussion patient and wife at the bedside and answered  questions.  Discussed with Dr. Norma Fredrickson neuro interventional radiologist. This patient is critically ill and at significant risk of neurological worsening, death and care requires constant monitoring of vital signs, hemodynamics,respiratory and cardiac monitoring, extensive review of multiple databases, frequent neurological assessment, discussion with family, other specialists and medical decision making of high complexity.I have made any additions or clarifications directly to the above note.This critical care time does not reflect procedure time, or teaching time or supervisory time of PA/NP/Med Resident etc but could involve care discussion time.  I spent 40 minutes of neurocritical care time  in the care of  this patient.     Antony Contras, MD Medical Director [facility] Pager: [phone] 04/02/2022 4:14 PM  To contact Stroke Continuity provider, please refer to [email or web address] After hours, contact General Neurology

## 2022-04-02 NOTE — Progress Notes (Signed)
PT Cancellation Note  Patient Details Name: Vincent Oconnell MRN: [id number] DOB: 04-23-1956   Cancelled Treatment:    Reason Eval/Treat Not Completed: Active bedrest order. Will plan to follow-up later as time permits.   Raymond Gurney, PT, DPT Acute Rehabilitation Services  Office: [phone]    Jewel Baize 04/02/2022, 8:27 AM

## 2022-04-02 NOTE — Plan of Care (Signed)
  Problem: Self-Care: Goal: Ability to participate in self-care as condition permits will improve Outcome: Progressing   

## 2022-04-02 NOTE — Progress Notes (Signed)
OT Cancellation Note  Patient Details Name: Vincent Oconnell MRN: [id number] DOB: 1956-08-01   Cancelled Treatment:    Reason Eval/Treat Not Completed: Active bedrest order (OT evaluation to f/u as activity orders progress)   A  04/02/2022, 7:44 AM

## 2022-04-02 NOTE — Addendum Note (Signed)
Addendum  created 04/02/22 0929 by Shelton Silvas, MD   Child order released for a procedure order, Clinical Note Signed, Intraprocedure Blocks edited, LDA created via procedure documentation, SmartForm saved

## 2022-04-02 NOTE — Progress Notes (Signed)
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 Taken back to [facility] for repeat intervention with one direct contact aspiration pass performed with minimal recanalization (TICI 1). Cangrelor load dose administered and a 4 x 24 mm neuroform atlas stent was deployed across the occlusion with complete recanalization. No thromboembolic or hemorrhagic complication. Awake and alert this am. Had transient drop in BP early this am with some  right extremity weakness, now resolved. BP has been stable since. Seen at bedside, Stroke team in room as well.  Objective: Physical Exam: BP 135/77 (BP Location: Left Arm)   Pulse (!) 101   Temp 99.6 F (37.6 C) (Axillary)   Resp 20   Ht 5\' 8"  (1.727 m)   Wt 87.8 kg   SpO2 96%   BMI 29.43 kg/m  Awake and alert (R)groin soft, NT, no hematoma Foot warm, excellent pedal pulses   Current Facility-Administered Medications:    0.9 %  sodium chloride infusion, , Intravenous, Continuous, Bhagat, Srishti L, MD, Last Rate: 50 mL/hr at 04/02/22 1000, Infusion Verify at 04/02/22 1000   acetaminophen (TYLENOL) tablet 650 mg, 650 mg, Oral, Q4H PRN **OR** acetaminophen (TYLENOL) 160 MG/5ML solution 650 mg, 650 mg, Per Tube, Q4H PRN **OR** acetaminophen (TYLENOL) suppository 650 mg, 650 mg, Rectal, Q4H PRN, de Sindy Messing, Erven Colla, MD   aspirin 81 MG chewable tablet, , , ,    aspirin tablet, , , PRN, de Sindy Messing, [place], MD, 81 mg at 04/01/22 2335   Chlorhexidine Gluconate Cloth 2 % PADS 6 each, 6 each, Topical, Daily, Donnetta Simpers, MD, 6 each at 04/02/22 1000   clevidipine (CLEVIPREX) infusion 0.5 mg/mL, 0-33 mg/hr, Intravenous, Continuous, de Sindy Messing, [place], MD, Last Rate: 24 mL/hr at 04/02/22 1000, 12 mg/hr at 04/02/22 1000   Oral care mouth rinse, 15 mL, Mouth Rinse, PRN, Garvin Fila, MD    pantoprazole (PROTONIX) injection 40 mg, 40 mg, Intravenous, QHS, Bhagat, Srishti L, MD, 40 mg at 04/01/22 2152   senna-docusate (Senokot-S) tablet 1 tablet, 1 tablet, Oral, QHS PRN, Bhagat, Srishti L, MD   ticagrelor (BRILINTA) 90 MG tablet, , , ,    ticagrelor (BRILINTA) tablet, , , PRN, de Sindy Messing, [place], MD, 90 mg at 04/01/22 2335  Labs: CBC Recent Labs    04/01/22 1600 04/01/22 1608  WBC 5.8  --   HGB 14.8 15.6  HCT 44.5 46.0  PLT 188  --    BMET Recent Labs    04/01/22 1600 04/01/22 1608  NA 142 141  K 3.8 3.7  CL 104 103  CO2 25  --   GLUCOSE 123* 122*  BUN 12 13  CREATININE 1.29* 1.30*  CALCIUM 9.5  --    LFT Recent Labs    04/01/22 1600  PROT 7.1  ALBUMIN 4.2  AST 21  ALT 19  ALKPHOS 52  BILITOT 0.9   PT/INR Recent Labs    04/01/22 1600  LABPROT 13.4  INR 1.0     Studies/Results: CT ANGIO HEAD NECK W WO CM  Result Date: 04/01/2022 CLINICAL DATA:  Code stroke EXAM: CT ANGIOGRAPHY HEAD AND NECK CT PERFUSION BRAIN TECHNIQUE: Multidetector CT imaging of the head and neck was performed using the standard protocol during bolus administration of intravenous contrast. Multiplanar CT image reconstructions and MIPs were obtained to evaluate the vascular anatomy. Carotid stenosis measurements (when applicable) are obtained  utilizing NASCET criteria, using the distal internal carotid diameter as the denominator. Multiphase CT imaging of the brain was performed following IV bolus contrast injection. Subsequent parametric perfusion maps were calculated using RAPID software. RADIATION DOSE REDUCTION: This exam was performed according to the departmental dose-optimization program which includes automated exposure control, adjustment of the mA and/or kV according to patient size and/or use of iterative reconstruction technique. CONTRAST:  121mL OMNIPAQUE IOHEXOL 350 MG/ML SOLN COMPARISON:  None Available. FINDINGS: CTA NECK FINDINGS SKELETON: There is no bony  spinal canal stenosis. No lytic or blastic lesion. OTHER NECK: Normal pharynx, larynx and major salivary glands. No cervical lymphadenopathy. Unremarkable thyroid gland. UPPER CHEST: No pneumothorax or pleural effusion. No nodules or masses. AORTIC ARCH: There is no calcific atherosclerosis of the aortic arch. There is no aneurysm, dissection or hemodynamically significant stenosis of the visualized portion of the aorta. Conventional 3 vessel aortic branching pattern. The visualized proximal subclavian arteries are widely patent. RIGHT CAROTID SYSTEM: No dissection, occlusion or aneurysm. Mild atherosclerotic calcification at the carotid bifurcation without hemodynamically significant stenosis. LEFT CAROTID SYSTEM: No dissection, occlusion or aneurysm. Mild atherosclerotic calcification at the carotid bifurcation without hemodynamically significant stenosis. VERTEBRAL ARTERIES: Codominant configuration. Both origins are clearly patent. There is no dissection, occlusion or flow-limiting stenosis to the skull base (V1-V3 segments). CTA HEAD FINDINGS POSTERIOR CIRCULATION: --Vertebral arteries: Normal V4 segments. --Inferior cerebellar arteries: Normal. --Basilar artery: Normal. --Superior cerebellar arteries: Normal. --Posterior cerebral arteries (PCA): Left PCA P2 segment remains occluded. There are fetal predominant origins of both posterior cerebral arteries. ANTERIOR CIRCULATION: --Intracranial internal carotid arteries: Normal. --Anterior cerebral arteries (ACA): Normal. Both A1 segments are present. Patent anterior communicating artery (a-comm). --Middle cerebral arteries (MCA): Normal. VENOUS SINUSES: As permitted by contrast timing, patent. ANATOMIC VARIANTS: None Review of the MIP images confirms the above findings. CT Brain Perfusion Findings: ASPECTS: 10 at 5:02 p.m. on 04/01/2022 CBF (<30%) Volume: 83mL Perfusion (Tmax>6.0s) volume: 35mL Mismatch Volume: 44mL Infarction Location:No infarction. The area of  ischemia is located in the left PCA territory. IMPRESSION: 1. Occlusion of the left PCA P2 segment, with 34 mL area of ischemic penumbra within the left PCA territory. No infarct by CT perfusion analysis. 2. Bilateral carotid bifurcation atherosclerosis without hemodynamically significant stenosis by NASCET criteria. Electronically Signed   By: Ulyses Jarred M.D.   On: 04/01/2022 22:36   CT CEREBRAL PERFUSION W CONTRAST  Result Date: 04/01/2022 CLINICAL DATA:  Code stroke EXAM: CT ANGIOGRAPHY HEAD AND NECK CT PERFUSION BRAIN TECHNIQUE: Multidetector CT imaging of the head and neck was performed using the standard protocol during bolus administration of intravenous contrast. Multiplanar CT image reconstructions and MIPs were obtained to evaluate the vascular anatomy. Carotid stenosis measurements (when applicable) are obtained utilizing NASCET criteria, using the distal internal carotid diameter as the denominator. Multiphase CT imaging of the brain was performed following IV bolus contrast injection. Subsequent parametric perfusion maps were calculated using RAPID software. RADIATION DOSE REDUCTION: This exam was performed according to the departmental dose-optimization program which includes automated exposure control, adjustment of the mA and/or kV according to patient size and/or use of iterative reconstruction technique. CONTRAST:  172mL OMNIPAQUE IOHEXOL 350 MG/ML SOLN COMPARISON:  None Available. FINDINGS: CTA NECK FINDINGS SKELETON: There is no bony spinal canal stenosis. No lytic or blastic lesion. OTHER NECK: Normal pharynx, larynx and major salivary glands. No cervical lymphadenopathy. Unremarkable thyroid gland. UPPER CHEST: No pneumothorax or pleural effusion. No nodules or masses. AORTIC ARCH: There is no calcific atherosclerosis  of the aortic arch. There is no aneurysm, dissection or hemodynamically significant stenosis of the visualized portion of the aorta. Conventional 3 vessel aortic branching  pattern. The visualized proximal subclavian arteries are widely patent. RIGHT CAROTID SYSTEM: No dissection, occlusion or aneurysm. Mild atherosclerotic calcification at the carotid bifurcation without hemodynamically significant stenosis. LEFT CAROTID SYSTEM: No dissection, occlusion or aneurysm. Mild atherosclerotic calcification at the carotid bifurcation without hemodynamically significant stenosis. VERTEBRAL ARTERIES: Codominant configuration. Both origins are clearly patent. There is no dissection, occlusion or flow-limiting stenosis to the skull base (V1-V3 segments). CTA HEAD FINDINGS POSTERIOR CIRCULATION: --Vertebral arteries: Normal V4 segments. --Inferior cerebellar arteries: Normal. --Basilar artery: Normal. --Superior cerebellar arteries: Normal. --Posterior cerebral arteries (PCA): Left PCA P2 segment remains occluded. There are fetal predominant origins of both posterior cerebral arteries. ANTERIOR CIRCULATION: --Intracranial internal carotid arteries: Normal. --Anterior cerebral arteries (ACA): Normal. Both A1 segments are present. Patent anterior communicating artery (a-comm). --Middle cerebral arteries (MCA): Normal. VENOUS SINUSES: As permitted by contrast timing, patent. ANATOMIC VARIANTS: None Review of the MIP images confirms the above findings. CT Brain Perfusion Findings: ASPECTS: 10 at 5:02 p.m. on 04/01/2022 CBF (<30%) Volume: 1mL Perfusion (Tmax>6.0s) volume: 62mL Mismatch Volume: 53mL Infarction Location:No infarction. The area of ischemia is located in the left PCA territory. IMPRESSION: 1. Occlusion of the left PCA P2 segment, with 34 mL area of ischemic penumbra within the left PCA territory. No infarct by CT perfusion analysis. 2. Bilateral carotid bifurcation atherosclerosis without hemodynamically significant stenosis by NASCET criteria. Electronically Signed   By: Ulyses Jarred M.D.   On: 04/01/2022 22:36   CT HEAD WO CONTRAST (5MM)  Result Date: 04/01/2022 CLINICAL DATA:   Acute neurologic deficit EXAM: CT HEAD WITHOUT CONTRAST TECHNIQUE: Contiguous axial images were obtained from the base of the skull through the vertex without intravenous contrast. RADIATION DOSE REDUCTION: This exam was performed according to the departmental dose-optimization program which includes automated exposure control, adjustment of the mA and/or kV according to patient size and/or use of iterative reconstruction technique. COMPARISON:  None Available. FINDINGS: Brain: There is no mass, hemorrhage or extra-axial collection. The size and configuration of the ventricles and extra-axial CSF spaces are normal. The brain parenchyma is normal, without acute or chronic infarction. Vascular: No abnormal hyperdensity of the major intracranial arteries or dural venous sinuses. No intracranial atherosclerosis. Skull: The visualized skull base, calvarium and extracranial soft tissues are normal. Sinuses/Orbits: No fluid levels or advanced mucosal thickening of the visualized paranasal sinuses. No mastoid or middle ear effusion. The orbits are normal. IMPRESSION: Normal head CT. Electronically Signed   By: Ulyses Jarred M.D.   On: 04/01/2022 21:06   CT ANGIO HEAD NECK W WO CM  Result Date: 04/01/2022 CLINICAL DATA:  Code stroke follow-up EXAM: CT ANGIOGRAPHY HEAD AND NECK TECHNIQUE: Multidetector CT imaging of the head and neck was performed using the standard protocol during bolus administration of intravenous contrast. Multiplanar CT image reconstructions and MIPs were obtained to evaluate the vascular anatomy. Carotid stenosis measurements (when applicable) are obtained utilizing NASCET criteria, using the distal internal carotid diameter as the denominator. RADIATION DOSE REDUCTION: This exam was performed according to the departmental dose-optimization program which includes automated exposure control, adjustment of the mA and/or kV according to patient size and/or use of iterative reconstruction technique.  CONTRAST:  10mL OMNIPAQUE IOHEXOL 350 MG/ML SOLN COMPARISON:  Same day CTA head/neck FINDINGS: CTA NECK FINDINGS Aortic arch: The imaged aortic arch is unremarkable. The origins of the major branch vessels  are patent. The subclavian arteries are patent to the level imaged. Right carotid system: The right common, internal, and external carotid arteries are patent with minimal plaque at the bifurcation but no hemodynamically significant stenosis or occlusion. There is no dissection or aneurysm. Left carotid system: The left common, internal, and external carotid arteries are patent with minimal plaque at the bifurcation but no hemodynamically significant stenosis or occlusion. There is no dissection or aneurysm. Vertebral arteries: Vertebral arteries are patent, without hemodynamically significant stenosis or occlusion. There is no dissection or aneurysm. Skeleton: There is mild multilevel degenerative change of the cervical spine. There is no acute osseous abnormality or suspicious osseous lesion. There is no visible canal hematoma. Other neck: Soft tissues of the neck are unremarkable. Upper chest: The imaged lung apices are clear. Review of the MIP images confirms the above findings CTA HEAD FINDINGS Anterior circulation: There is mild calcified plaque in the left intracranial ICA without hemodynamically significant stenosis or occlusion. The bilateral MCAs are patent without proximal high-grade stenosis or occlusion. Specifically, the previously suspected left M2 occlusion/stenosis is not seen and was likely artifactual. The bilateral ACAs are patent. The anterior communicating artery is normal. There is no aneurysm or AVM. Posterior circulation: The bilateral V4 segments are patent. PICA is identified bilaterally. The basilar artery is patent There is a fetal origin of the left PCA. There is short-segment critical stenosis/occlusion of the left P2 segment (7-120) with distal reconstitution. There is irregularity  of the PCA distal to this point without other evidence of occlusion. The right PCA is patent. The right posterior communicating artery is also identified. There is no aneurysm or AVM. Venous sinuses: Patent. Anatomic variants: As above. Review of the MIP images confirms the above findings IMPRESSION: 1. Short-segment critical stenosis/occlusion of the left PCA with reconstitution. No other definite occlusion is seen in the left PCA. 2. Otherwise patent intracranial vasculature. The previously suspected left M2 stenosis/occlusion is not seen on the current study and was likely artifactual. 3. Patent vasculature of the neck with no hemodynamically significant stenosis or occlusion. Electronically Signed   By: Valetta Mole M.D.   On: 04/01/2022 17:20   CT HEAD WO CONTRAST (5MM)  Result Date: 04/01/2022 CLINICAL DATA:  Stroke suspected, worsening symptoms after TNK EXAM: CT HEAD WITHOUT CONTRAST TECHNIQUE: Contiguous axial images were obtained from the base of the skull through the vertex without intravenous contrast. RADIATION DOSE REDUCTION: This exam was performed according to the departmental dose-optimization program which includes automated exposure control, adjustment of the mA and/or kV according to patient size and/or use of iterative reconstruction technique. COMPARISON:  04/01/2022 FINDINGS: Brain: No evidence of acute infarction, hemorrhage, cerebral edema, mass, mass effect, or midline shift. No hydrocephalus or extra-axial fluid collection. Vascular: No hyperdense vessel. Contrast is noted in the vasculature from recent CTA. Skull: Normal. Negative for fracture or focal lesion. Sinuses/Orbits: No acute finding. Other: The mastoid air cells are well aerated. IMPRESSION: No acute intracranial process. Electronically Signed   By: Merilyn Baba M.D.   On: 04/01/2022 17:07   CT HEAD CODE STROKE WO CONTRAST  Result Date: 04/01/2022 CLINICAL DATA:  Right eye vision loss and slurred speech EXAM: CT  ANGIOGRAPHY HEAD AND NECK TECHNIQUE: Multidetector CT imaging of the head and neck was performed using the standard protocol during bolus administration of intravenous contrast. Multiplanar CT image reconstructions and MIPs were obtained to evaluate the vascular anatomy. Carotid stenosis measurements (when applicable) are obtained utilizing NASCET criteria, using the distal  internal carotid diameter as the denominator. RADIATION DOSE REDUCTION: This exam was performed according to the departmental dose-optimization program which includes automated exposure control, adjustment of the mA and/or kV according to patient size and/or use of iterative reconstruction technique. CONTRAST:  OMNIPAQUE IOHEXOL 350 MG/ML SOLN COMPARISON:  None Available. FINDINGS: CT HEAD FINDINGS Brain: There is no acute intracranial hemorrhage, extra-axial fluid collection, or acute infarct. Parenchymal volume is normal. The ventricles are normal in size. Gray-white differentiation is preserved. There is no mass lesion. There is no mass effect or midline shift. Vascular: No dense vessel is seen. Skull: Normal. Negative for fracture or focal lesion. Sinuses/Orbits: Imaged paranasal sinuses are clear. The globes and orbits are unremarkable. Other: None. ASPECTS (Alberta Stroke Program Early CT Score) - Ganglionic level infarction (caudate, lentiform nuclei, internal capsule, insula, M1-M3 cortex): 7 - Supraganglionic infarction (M4-M6 cortex): 3 Total score (0-10 with 10 being normal): 10 CTA NECK FINDINGS The CTA images are markedly degraded by poor bolus timing. Aortic arch: The imaged aortic arch is grossly unremarkable. The origins of the major branch vessels appear patent. The subclavian arteries appear patent to the level imaged. Right carotid system: There is minimal plaque at the carotid bifurcation. There is no definite hemodynamically significant stenosis or occlusion. There is no definite dissection or aneurysm. Left carotid  system: There is scattered mild soft plaque in the left common carotid artery without hemodynamically significant stenosis. There is minimal plaque at the bifurcation without definite hemodynamically significant stenosis or occlusion. There is no definite dissection or aneurysm. Vertebral arteries: The V1 and proximal V2 segments of the vertebral arteries are suboptimally evaluated. The distal V2 and V3 segments appear patent without definite hemodynamically significant stenosis or occlusion. There is no definite dissection or aneurysm. Skeleton: Other neck: Upper chest: Review of the MIP images confirms the above findings CTA HEAD FINDINGS Anterior circulation: There is no definite hemodynamically significant stenosis or occlusion in the intracranial ICAs Bilateral M1 segments appear grossly patent. There is possible high-grade stenosis or occlusion of a left M2 segment in the sylvian fissure (18-107, 16-14). There is no definite proximal high-grade stenosis or occlusion on the right, though evaluation is markedly suboptimal due to bolus timing. There is no definite proximal high-grade stenosis or occlusion in the anterior cerebral arteries within the above confines. There is no definite aneurysm. Posterior circulation: The bilateral V4 segments and basilar artery are grossly patent without definite proximal high-grade stenosis or occlusion. There is short-segment high-grade stenosis/occlusion of the left P2 segment (5-47, 18-114). There is apparent reconstitution, though there is a suspected additional occlusion distally (18-133). Bilateral posterior communicating arteries are identified with a fetal origin on the left. There is no definite proximal high-grade stenosis or occlusion on the right. There is no definite aneurysm. Venous sinuses: Patent. Anatomic variants: As above. Review of the MIP images confirms the above findings IMPRESSION: 1. No acute intracranial hemorrhage or infarct.  ASPECTS is 10. 2. The  CTA image quality is markedly degraded by poor bolus timing with significantly suboptimal evaluation of the distal intracranial vasculature. 3. Short-segment occlusion of the left P2 segment. There is apparent reconstitution, but there is a suspected additional occlusion distally. 4. Suspected high-grade stenosis or occlusion of a left M2 segment in the sylvian fissure. 5. No other definite proximal high-grade stenosis or occlusion in the intracranial vasculature. 6. No definite hemodynamically significant stenosis or occlusion in the vasculature of the neck within the above confines. The results of the initial noncontrast head  CT were called by telephone at the time of interpretation on 04/01/2022 at 4:12 pm to provider Lyanne Co NP, who verbally acknowledged these results. The CTA findings were discussed with Dr. Iver Nestle at 4:45pm. Electronically Signed   By: Lesia Hausen M.D.   On: 04/01/2022 17:02   CT ANGIO HEAD NECK W WO CM (CODE STROKE)  Result Date: 04/01/2022 CLINICAL DATA:  Right eye vision loss and slurred speech EXAM: CT ANGIOGRAPHY HEAD AND NECK TECHNIQUE: Multidetector CT imaging of the head and neck was performed using the standard protocol during bolus administration of intravenous contrast. Multiplanar CT image reconstructions and MIPs were obtained to evaluate the vascular anatomy. Carotid stenosis measurements (when applicable) are obtained utilizing NASCET criteria, using the distal internal carotid diameter as the denominator. RADIATION DOSE REDUCTION: This exam was performed according to the departmental dose-optimization program which includes automated exposure control, adjustment of the mA and/or kV according to patient size and/or use of iterative reconstruction technique. CONTRAST:  OMNIPAQUE IOHEXOL 350 MG/ML SOLN COMPARISON:  None Available. FINDINGS: CT HEAD FINDINGS Brain: There is no acute intracranial hemorrhage, extra-axial fluid collection, or acute infarct. Parenchymal  volume is normal. The ventricles are normal in size. Gray-white differentiation is preserved. There is no mass lesion. There is no mass effect or midline shift. Vascular: No dense vessel is seen. Skull: Normal. Negative for fracture or focal lesion. Sinuses/Orbits: Imaged paranasal sinuses are clear. The globes and orbits are unremarkable. Other: None. ASPECTS (Alberta Stroke Program Early CT Score) - Ganglionic level infarction (caudate, lentiform nuclei, internal capsule, insula, M1-M3 cortex): 7 - Supraganglionic infarction (M4-M6 cortex): 3 Total score (0-10 with 10 being normal): 10 CTA NECK FINDINGS The CTA images are markedly degraded by poor bolus timing. Aortic arch: The imaged aortic arch is grossly unremarkable. The origins of the major branch vessels appear patent. The subclavian arteries appear patent to the level imaged. Right carotid system: There is minimal plaque at the carotid bifurcation. There is no definite hemodynamically significant stenosis or occlusion. There is no definite dissection or aneurysm. Left carotid system: There is scattered mild soft plaque in the left common carotid artery without hemodynamically significant stenosis. There is minimal plaque at the bifurcation without definite hemodynamically significant stenosis or occlusion. There is no definite dissection or aneurysm. Vertebral arteries: The V1 and proximal V2 segments of the vertebral arteries are suboptimally evaluated. The distal V2 and V3 segments appear patent without definite hemodynamically significant stenosis or occlusion. There is no definite dissection or aneurysm. Skeleton: Other neck: Upper chest: Review of the MIP images confirms the above findings CTA HEAD FINDINGS Anterior circulation: There is no definite hemodynamically significant stenosis or occlusion in the intracranial ICAs Bilateral M1 segments appear grossly patent. There is possible high-grade stenosis or occlusion of a left M2 segment in the sylvian  fissure (18-107, 16-14). There is no definite proximal high-grade stenosis or occlusion on the right, though evaluation is markedly suboptimal due to bolus timing. There is no definite proximal high-grade stenosis or occlusion in the anterior cerebral arteries within the above confines. There is no definite aneurysm. Posterior circulation: The bilateral V4 segments and basilar artery are grossly patent without definite proximal high-grade stenosis or occlusion. There is short-segment high-grade stenosis/occlusion of the left P2 segment (5-47, 18-114). There is apparent reconstitution, though there is a suspected additional occlusion distally (18-133). Bilateral posterior communicating arteries are identified with a fetal origin on the left. There is no definite proximal high-grade stenosis or occlusion on the right.  There is no definite aneurysm. Venous sinuses: Patent. Anatomic variants: As above. Review of the MIP images confirms the above findings IMPRESSION: 1. No acute intracranial hemorrhage or infarct.  ASPECTS is 10. 2. The CTA image quality is markedly degraded by poor bolus timing with significantly suboptimal evaluation of the distal intracranial vasculature. 3. Short-segment occlusion of the left P2 segment. There is apparent reconstitution, but there is a suspected additional occlusion distally. 4. Suspected high-grade stenosis or occlusion of a left M2 segment in the sylvian fissure. 5. No other definite proximal high-grade stenosis or occlusion in the intracranial vasculature. 6. No definite hemodynamically significant stenosis or occlusion in the vasculature of the neck within the above confines. The results of the initial noncontrast head CT were called by telephone at the time of interpretation on 04/01/2022 at 4:12 pm to provider Lyanne Co NP, who verbally acknowledged these results. The CTA findings were discussed with Dr. Iver Nestle at 4:45pm. Electronically Signed   By: Lesia Hausen M.D.   On:  04/01/2022 17:02    Assessment/Plan: S/p (L) PCA thrombectomy with subsequent reocclusion of the proximal left P2/PCA. One direct contact aspiration pass performed with minimal recanalization (TICI 1). Cangrelor load dose administered and a 4 x 24 mm neuroform atlas stent was deployed across the occlusion with complete recanalization. No thromboembolic or hemorrhagic complication. Looks good this am. Cont ASA and Brilinta   LOS: 1 day   I spent a total of 15 minutes in face to face in clinical consultation, greater than 50% of which was counseling/coordinating care for post intervention for (L)PCA CVA  Brayton El PA-C 04/02/2022 11:09 AM

## 2022-04-02 NOTE — Procedures (Addendum)
INTERVENTIONAL NEURORADIOLOGY BRIEF POSTPROCEDURE NOTE  DIAGNOSTIC CEREBRAL ANGIOGRAM, MECHANICAL THROMBECTOMY AND INTRACRANIAL STENTING  Attending: Dr. Baldemar Lenis  Assistant: Dr. Marjo Bicker  Diagnosis: Left PCA occlusion  Access site: RCFA  Access closure: Perclose Prostyle  Anesthesia: GETA  Medication used: IV cangrelor bolus and drip; brilinta 180 mg and ASA 81 mg via OGT.  Complications: None.  Estimated blood loss: Minimal.  Specimen: None.  Findings: Reocclusion of the proximal left P2/PCA. One direct contact aspiration pass performed with minimal recanalization (TICI 1). Cangrelor load dose administered and a 4 x 24 mm neuroform atlas stent was deployed across the occlusion with complete recanalization. No thromboembolic or hemorrhagic complication.  The patient tolerated the procedure well without incident or complication and is in stable condition.   PLAN: - bed rest x 6 hours post femoral puncture - SBP 120-150 mmHg  - Please note change in SBP goal.  - STAT head CT for any acute neurological decline - Cangrelor drip to be continued until the end of this bag. No additional bags needed as patient has been loaded with ASA and brilinta. - ASA 81 mg qd + Brilinta 90 mg bid

## 2022-04-03 ENCOUNTER — Encounter (HOSPITAL_COMMUNITY): Payer: Self-pay

## 2022-04-03 ENCOUNTER — Other Ambulatory Visit (HOSPITAL_COMMUNITY): Payer: Self-pay

## 2022-04-03 ENCOUNTER — Telehealth (HOSPITAL_COMMUNITY): Payer: Self-pay | Admitting: Pharmacy Technician

## 2022-04-03 DIAGNOSIS — I63512 Cerebral infarction due to unspecified occlusion or stenosis of left middle cerebral artery: Secondary | ICD-10-CM | POA: Diagnosis not present

## 2022-04-03 HISTORY — PX: IR ANGIO VERTEBRAL SEL VERTEBRAL UNI L MOD SED: IMG5367

## 2022-04-03 LAB — GLUCOSE, CAPILLARY
Glucose-Capillary: 117 mg/dL — ABNORMAL HIGH (ref 70–99)
Glucose-Capillary: 112 mg/dL — ABNORMAL HIGH (ref 70–99)
Glucose-Capillary: 112 mg/dL — ABNORMAL HIGH (ref 70–99)

## 2022-04-03 MED ORDER — ENOXAPARIN SODIUM 30 MG/0.3ML IJ SOSY
30.0000 mg | PREFILLED_SYRINGE | Freq: Every day | INTRAMUSCULAR | Status: DC
Start: 2022-04-03 — End: 2022-04-03

## 2022-04-03 MED ORDER — ATORVASTATIN CALCIUM 80 MG PO TABS
80.0000 mg | ORAL_TABLET | Freq: Every day | ORAL | Status: DC
  Administered 2022-04-03 – 2022-04-04 (×2): 80 mg via ORAL
  Filled 2022-04-03 (×2): qty 1

## 2022-04-03 MED ORDER — PANTOPRAZOLE SODIUM 40 MG PO TBEC
40.0000 mg | DELAYED_RELEASE_TABLET | Freq: Every day | ORAL | Status: DC
  Administered 2022-04-03: 40 mg via ORAL
  Filled 2022-04-03: qty 1

## 2022-04-03 MED ORDER — ENOXAPARIN SODIUM 40 MG/0.4ML IJ SOSY
40.0000 mg | PREFILLED_SYRINGE | Freq: Every day | INTRAMUSCULAR | Status: DC
  Administered 2022-04-03 – 2022-04-04 (×2): 40 mg via SUBCUTANEOUS
  Filled 2022-04-03 (×2): qty 0.4

## 2022-04-03 NOTE — Evaluation (Signed)
Occupational Therapy Evaluation Patient Details Name: Vincent Oconnell MRN: [id number] DOB: January 05, 1956 Today's Date: 04/03/2022   History of Present Illness 66 y/o male presented to ED on 04/01/22 for R sided vision loss and dizziness. TNK given on 7/17 after CT head was negative with symptoms worsening. CTA showed L PCA occlusion. S/p mechanical thrombectomy with stent placement on 7/18. MRI showed acute L PCA infarcts. No significant PMH.   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 He lives with his wife who is available to assist as needed at d/c. Upon evaluation pt had functional deficits in cognition with notable STM impairments, as well as R visual field deficit, R extremities paraesthesias, RUE slowed coordination and decreased strength, and 1 mild R knee buckle during ambulation once fatigued. Overall he required min G for all OOB tasks without RW and cues for R environment and pathfinding. He also requires up to min G for ADLs. He will benefit from OT acutely. Recommend d/c to home with OP OT at a [facility] for continued therapies.      Recommendations for follow up therapy are one component of a multi-disciplinary discharge planning process, led by the attending physician.  Recommendations may be updated based on patient status, additional functional criteria and insurance authorization.   Follow Up Recommendations  Outpatient OT (Neuro OP OT)    Assistance Recommended at Discharge Intermittent Supervision/Assistance  Patient can return home with the following A little help with walking and/or transfers;Assistance with cooking/housework;Direct supervision/assist for medications management;Direct supervision/assist for financial management;Assist for transportation    Functional Status Assessment  Patient has had a recent decline in their functional status and demonstrates the ability to make  significant improvements in function in a reasonable and predictable amount of time.        Precautions / Restrictions Precautions Precautions: Fall Precaution Comments: R hemianopsia Restrictions Weight Bearing Restrictions: No      Mobility Bed Mobility Overal bed mobility: Modified Independent                  Transfers Overall transfer level: Needs assistance   Transfers: Sit to/from Stand Sit to Stand: Supervision                  Balance Overall balance assessment: Needs assistance Sitting-balance support: Feet supported Sitting balance-Leahy Scale: Good     Standing balance support: No upper extremity supported Standing balance-Leahy Scale: Fair                             ADL either performed or assessed with clinical judgement   ADL Overall ADL's : Needs assistance/impaired Eating/Feeding: Supervision/ safety;Sitting Eating/Feeding Details (indicate cue type and reason): assist for R side of tray Grooming: Min guard;Standing   Upper Body Bathing: Set up;Sitting   Lower Body Bathing: Min guard;Sit to/from stand Lower Body Bathing Details (indicate cue type and reason): able to don socks on EOB Upper Body Dressing : Set up;Sitting   Lower Body Dressing: Min guard;Sit to/from stand   Toilet Transfer: Min guard;Ambulation   Toileting- Clothing Manipulation and Hygiene: Supervision/safety;Sitting/lateral lean       Functional mobility during ADLs: Min guard;Cueing for safety General ADL Comments: close min G for all OOB tasks including functional mobility - mild R knee buckle with fatigue. Impaired by R visual field deficit and R paraesthesias  Vision Baseline Vision/History: 1 Wears glasses Ability to See in Adequate Light: 0 Adequate Patient Visual Report: Peripheral vision impairment Vision Assessment?: Vision impaired- to be further tested in functional context;Yes Eye Alignment: Within Functional Limits Alignment/Gaze  Preference: Within Defined Limits Visual Fields: Right homonymous hemianopsia;Right visual field deficit Additional Comments: R hemianopsia - good awareness of cut            Pertinent Vitals/Pain Pain Assessment Pain Assessment: No/denies pain     Hand Dominance Right   Extremity/Trunk Assessment Upper Extremity Assessment Upper Extremity Assessment: RUE deficits/detail RUE Deficits / Details: 4+/5 MMT globally, full ROM. decreased sensation. slowed coordination RUE Sensation: decreased light touch RUE Coordination: decreased fine motor   Lower Extremity Assessment Lower Extremity Assessment: Defer to PT evaluation   Cervical / Trunk Assessment Cervical / Trunk Assessment: Normal   Communication Communication Communication: No difficulties   Cognition Arousal/Alertness: Awake/alert Behavior During Therapy: WFL for tasks assessed/performed Overall Cognitive Status: Impaired/Different from baseline Area of Impairment: Attention, Memory, Safety/judgement, Problem solving, Awareness                     Memory: Decreased recall of precautions, Decreased short-term memory   Safety/Judgement: Decreased awareness of deficits, Decreased awareness of safety Awareness: Emergent Problem Solving: Slow processing       General Comments  VSS on RA, some dizziness noted upon standing     Home Living Family/patient expects to be discharged to:: Private residence Living Arrangements: Spouse/significant other Available Help at Discharge: Family;Available 24 hours/day Type of Home: House Home Access: Stairs to enter [employer] of Steps: 1 Entrance Stairs-Rails: None Home Layout: One level     Bathroom Shower/Tub: Chief Strategy Officer: Standard     Home Equipment: None   Additional Comments: lives with wife who works part time, has family near by to assist as well      Prior Functioning/Environment Prior Level of Function :  Independent/Modified Independent;Driving;Working/employed             Mobility Comments: no AD 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, operational as Careers adviser Problem List: Decreased strength;Decreased range of motion;Impaired balance (sitting and/or standing);Impaired vision/perception;Impaired sensation;Impaired UE functional use      OT Treatment/Interventions: Self-care/ADL training;Therapeutic exercise;DME and/or AE instruction;Therapeutic activities;Patient/family education;Balance training    OT Goals(Current goals can be found in the care plan section) Acute Rehab OT Goals Patient Stated Goal: home OT Goal Formulation: With patient Time For Goal Achievement: 04/17/22 Potential to Achieve Goals: Good ADL Goals Pt Will Perform Grooming: Independently;standing Pt Will Perform Lower Body Dressing: Independently;sit to/from stand Pt Will Transfer to Toilet: Independently;ambulating Additional ADL Goal #1: Pt will locate 5 ADL items in his R environment independently as a precursor to ADL completion Additional ADL Goal #2: Pt will indep compete IADL medication management task  OT Frequency: Min 2X/week    Co-evaluation PT/OT/SLP Co-Evaluation/Treatment: Yes Reason for Co-Treatment: Complexity of the patient's impairments (multi-system involvement);For patient/therapist safety   OT goals addressed during session: ADL's and self-care      AM-PAC OT "6 Clicks" Daily Activity     Outcome Measure Help from another person eating meals?: A Little Help from another person taking care of personal grooming?: A Little Help from another person toileting, which includes using toliet, bedpan, or urinal?: A Little Help from another person bathing (including washing, rinsing, drying)?: A Little Help from another person to put on and  taking off regular upper body clothing?: None Help from another person to put on and taking off regular lower body clothing?: A Little 6 Click Score:  19   End of Session Equipment Utilized During Treatment: Gait belt Nurse Communication: Mobility status  Activity Tolerance: Patient tolerated treatment well Patient left: in chair;with call bell/phone within reach;with chair alarm set  OT Visit Diagnosis: Unsteadiness on feet (R26.81);Other abnormalities of gait and mobility (R26.89);Muscle weakness (generalized) (M62.81);Hemiplegia and hemiparesis Hemiplegia - Right/Left: Right Hemiplegia - dominant/non-dominant: Dominant Hemiplegia - caused by: Cerebral infarction                Time: 3295-1884 OT Time Calculation (min): 32 min Charges:  OT General Charges $OT Visit: 1 Visit OT Evaluation $OT Eval Moderate Complexity: 1 Mod     A  04/03/2022, 11:10 AM

## 2022-04-03 NOTE — TOC Benefit Eligibility Note (Addendum)
Patient Product/process development scientist completed.    The patient is currently admitted and upon discharge could be taking Brilinta 90 mg.  The current 30 day co-pay is, $30.00.   The patient is currently admitted and upon discharge could be taking clopidogrel (Plavix) 75 mg.  The current 30 day co-pay is, $8.89.   The patient is insured through [employer]     Roland Earl, CPhT Pharmacy Patient Advocate Specialist [facility] Patient Advocate Team Direct Number: [phone]  Fax: [phone]

## 2022-04-03 NOTE — Evaluation (Signed)
Physical Therapy Evaluation Patient Details Name: Vincent Oconnell MRN: [id number] DOB: Apr 30, 1956 Today's Date: 04/03/2022  History of Present Illness  66 y/o male presented to ED on 04/01/22 for R sided vision loss and dizziness. TNK given on 7/17 after CT head was negative with symptoms worsening. CTA showed L PCA occlusion. S/p mechanical thrombectomy with stent placement on 7/18. MRI showed acute L PCA infarcts. No significant PMH.  Clinical Impression  Patient admitted with the above. PTA, patient lives with wife and was independent. Patient presents with mild R sided weakness, impaired sensation, impaired coordination, and STM deficits. Patient overall required min guard for ambulation with no AD. With addition of head turns and stepping over obstacle, patient required minA. One instance of mild R knee buckling with fatigue noted but no overt LOB. Cues required for attending to R side of environment due to visual deficits. Patient will benefit from skilled PT services during acute stay to address listed deficits. Recommend OPPT at discharge to address strength and balance deficits.        Recommendations for follow up therapy are one component of a multi-disciplinary discharge planning process, led by the attending physician.  Recommendations may be updated based on patient status, additional functional criteria and insurance authorization.  Follow Up Recommendations Outpatient PT      Assistance Recommended at Discharge Intermittent Supervision/Assistance  Patient can return home with the following  A little help with walking and/or transfers;A little help with bathing/dressing/bathroom;Assistance with cooking/housework;Direct supervision/assist for medications management;Direct supervision/assist for financial management;Assist for transportation;Help with stairs or ramp for entrance    Equipment Recommendations None recommended by PT  Recommendations for Other Services       Functional  Status Assessment Patient has had a recent decline in their functional status and demonstrates the ability to make significant improvements in function in a reasonable and predictable amount of time.     Precautions / Restrictions Precautions Precautions: Fall Precaution Comments: R hemianopsia Restrictions Weight Bearing Restrictions: No      Mobility  Bed Mobility Overal bed mobility: Modified Independent                  Transfers Overall transfer level: Needs assistance   Transfers: Sit to/from Stand Sit to Stand: Supervision                Ambulation/Gait Ambulation/Gait assistance: Min assist, Min guard Gait Distance (Feet): 300 Feet Assistive device: None Gait Pattern/deviations: Step-through pattern, Decreased stride length, Drifts right/left Gait velocity: decreased     General Gait Details: min guard for safety. With addition of head turns and stepping over obstacles, required minA for balance. One instance mild R knee buckling  Stairs            Wheelchair Mobility    Modified Rankin (Stroke Patients Only)       Balance Overall balance assessment: Needs assistance Sitting-balance support: Feet supported Sitting balance-Leahy Scale: Good     Standing balance support: No upper extremity supported Standing balance-Leahy Scale: Fair                               Pertinent Vitals/Pain Pain Assessment Pain Assessment: No/denies pain    Home Living Family/patient expects to be discharged to:: Private residence Living Arrangements: Spouse/significant other Available Help at Discharge: Family;Available 24 hours/day Type of Home: House Home Access: Stairs to enter Entrance Stairs-Rails: None Entrance Stairs-Number of Steps: 1   Home  Layout: One level Home Equipment: None Additional Comments: lives with wife who works part time, has family near by to assist as well    Prior Function Prior Level of Function :  Independent/Modified Independent;Driving;Working/employed             Mobility Comments: no AD 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, operational as Engineer, technical sales: Right    Extremity/Trunk Assessment   Upper Extremity Assessment Upper Extremity Assessment: Defer to OT evaluation    Lower Extremity Assessment Lower Extremity Assessment: RLE deficits/detail RLE Deficits / Details: grossly 4+/5, decreased sensation and impaired coordination    Cervical / Trunk Assessment Cervical / Trunk Assessment: Normal  Communication   Communication: No difficulties  Cognition Arousal/Alertness: Awake/alert Behavior During Therapy: WFL for tasks assessed/performed Overall Cognitive Status: Impaired/Different from baseline Area of Impairment: Attention, Memory, Safety/judgement, Problem solving, Awareness                     Memory: Decreased recall of precautions, Decreased short-term memory   Safety/Judgement: Decreased awareness of deficits, Decreased awareness of safety Awareness: Emergent Problem Solving: Slow processing          General Comments General comments (skin integrity, edema, etc.): VSS    Exercises     Assessment/Plan    PT Assessment Patient needs continued PT services  PT Problem List Decreased strength;Decreased activity tolerance;Decreased mobility;Decreased balance;Decreased coordination;Decreased cognition;Impaired sensation       PT Treatment Interventions DME instruction;Gait training;Stair training;Functional mobility training;Therapeutic activities;Therapeutic exercise;Balance training;Patient/family education    PT Goals (Current goals can be found in the Care Plan section)  Acute Rehab PT Goals Patient Stated Goal: to get better PT Goal Formulation: With patient Time For Goal Achievement: 04/17/22 Potential to Achieve Goals: Good    Frequency Min 4X/week     Co-evaluation PT/OT/SLP  Co-Evaluation/Treatment: Yes Reason for Co-Treatment: Complexity of the patient's impairments (multi-system involvement);For patient/therapist safety PT goals addressed during session: Mobility/safety with mobility;Balance;Strengthening/ROM         AM-PAC PT "6 Clicks" Mobility  Outcome Measure Help needed turning from your back to your side while in a flat bed without using bedrails?: None Help needed moving from lying on your back to sitting on the side of a flat bed without using bedrails?: None Help needed moving to and from a bed to a chair (including a wheelchair)?: A Little Help needed standing up from a chair using your arms (e.g., wheelchair or bedside chair)?: A Little Help needed to walk in hospital room?: A Little Help needed climbing 3-5 steps with a railing? : A Lot 6 Click Score: 19    End of Session Equipment Utilized During Treatment: Gait belt Activity Tolerance: Patient tolerated treatment well Patient left: in chair;with call bell/phone within reach;with chair alarm set Nurse Communication: Mobility status PT Visit Diagnosis: Unsteadiness on feet (R26.81);Muscle weakness (generalized) (M62.81);Other symptoms and signs involving the nervous system (F62.130)    Time: 8657-8469 PT Time Calculation (min) (ACUTE ONLY): 34 min   Charges:   PT Evaluation $PT Eval Moderate Complexity: 1 Mod           A. Dan Humphreys PT, DPT Acute Rehabilitation Services Office [phone]   Viviann Spare 04/03/2022, 1:46 PM

## 2022-04-03 NOTE — Progress Notes (Signed)
PT Cancellation Note  Patient Details Name: Vincent Oconnell MRN: [id number] DOB: December 21, 1955   Cancelled Treatment:    Reason Eval/Treat Not Completed: Active bedrest order Will follow up once activity orders are updated.    A. Dan Humphreys PT, DPT Acute Rehabilitation Services Office [phone]    Viviann Spare 04/03/2022, 8:16 AM

## 2022-04-03 NOTE — Telephone Encounter (Signed)
Pharmacy Patient Advocate Encounter  Insurance verification completed.    The patient is insured through [employer]   The patient is currently admitted and ran test claims for the following: Brilinta, clopidogrel.  Copays and coinsurance results were relayed to Inpatient clinical team.

## 2022-04-03 NOTE — Evaluation (Signed)
Speech Language Pathology Evaluation Patient Details Name: Vincent Oconnell MRN: [id number] DOB: 1955-12-17 Today's Date: 04/03/2022 Time: 1000-1015 SLP Time Calculation (min) (ACUTE ONLY): 15 min  Problem List:  Patient Active Problem List   Diagnosis Date Noted   Acute ischemic left MCA stroke (HCC) 04/01/2022   Past Medical History: No past medical history on file. Past Surgical History:  Past Surgical History:  Procedure Laterality Date   IR ANGIO VERTEBRAL SEL VERTEBRAL UNI L MOD SED  04/03/2022   IR CT HEAD LTD  04/02/2022   IR CT HEAD LTD  04/02/2022   IR PERCUTANEOUS ART THROMBECTOMY/INFUSION INTRACRANIAL INC DIAG ANGIO  04/01/2022   IR PERCUTANEOUS ART THROMBECTOMY/INFUSION INTRACRANIAL INC DIAG ANGIO  04/02/2022   IR US GUIDE VASC ACCESS RIGHT  04/01/2022   IR US GUIDE VASC ACCESS RIGHT  04/02/2022   RADIOLOGY WITH ANESTHESIA N/A 04/01/2022   Procedure: RADIOLOGY WITH ANESTHESIA;  Surgeon: Radiologist, Medication, MD;  Location: [facility] OR;  Service: Radiology;  Laterality: N/A;   HPI:  65 y/o male presented to ED on 04/01/22 for R sided vision loss and dizziness. TNK given on 7/17 after CT head was negative with symptoms worsening. CTA showed L PCA occlusion. S/p mechanical thrombectomy with stent placement on 7/18. MRI showed acute L PCA infarcts. No significant PMH.   Assessment / Plan / Recommendation Clinical Impression  Patient presents with a mild-moderate cognitive impairment which is impacting primarily his memory for short term recall, recall of recent events but also his awareness and ability to self-monitor when completing complex verbal problem solving. Patient denies having any difficulty with his memory prior to this stroke.OT had informed SLP that patient was not able to recall being told that he had a stroke even though MD had just been in the room prior to OT evaluation. SLP asked patient what happened and initially he said, "I was at the gym and something happened and I came  here". He was then able to state when asked to elaborate, "I had a stroke". SLP recommending acute care level cognitive treatment while admitted and OP SLP f/u upon discharge from hospital.    SLP Assessment  SLP Recommendation/Assessment: Patient needs continued Speech Lanaguage Pathology Services SLP Visit Diagnosis: Cognitive communication deficit (R41.841)    Recommendations for follow up therapy are one component of a multi-disciplinary discharge planning process, led by the attending physician.  Recommendations may be updated based on patient status, additional functional criteria and insurance authorization.    Follow Up Recommendations  Outpatient SLP    Assistance Recommended at Discharge  Intermittent Supervision/Assistance  Functional Status Assessment Patient has had a recent decline in their functional status and demonstrates the ability to make significant improvements in function in a reasonable and predictable amount of time.  Frequency and Duration min 1 x/week  1 week      SLP Evaluation Cognition  Overall Cognitive Status: Impaired/Different from baseline Arousal/Alertness: Awake/alert Orientation Level: Oriented X4 Year: 2024 Month: July Day of Week: Other (Comment) ("Im not sure") Attention: Sustained Sustained Attention: Appears intact Memory: Impaired Memory Impairment: Storage deficit;Retrieval deficit;Decreased recall of new information Awareness: Appears intact Problem Solving: Impaired Problem Solving Impairment: Verbal complex Executive Function: Self Monitoring Self Monitoring: Impaired Self Monitoring Impairment: Verbal complex Safety/Judgment: Appears intact       Comprehension  Auditory Comprehension Overall Auditory Comprehension: Appears within functional limits for tasks assessed    Expression Expression Primary Mode of Expression: Verbal Verbal Expression Overall Verbal Expression: Appears within functional limits for  tasks  assessed Written Expression Dominant Hand: Right   Oral / Motor  Oral Motor/Sensory Function Overall Oral Motor/Sensory Function: Mild impairment Facial ROM: Within Functional Limits Facial Symmetry: Abnormal symmetry right Facial Strength: Within Functional Limits Facial Sensation: Reduced right Lingual ROM: Within Functional Limits Lingual Symmetry: Within Functional Limits Lingual Strength: Within Functional Limits Motor Speech Overall Motor Speech: Appears within functional limits for tasks assessed Respiration: Within functional limits Resonance: Within functional limits Articulation: Within functional limitis Intelligibility: Intelligible Motor Planning: Witnin functional limits Motor Speech Errors: Not applicable    Angela Nevin, MA, CCC-SLP Speech Therapy

## 2022-04-03 NOTE — Progress Notes (Signed)
OT Cancellation Note  Patient Details Name: Vincent Oconnell MRN: [id number] DOB: August 08, 1956   Cancelled Treatment:    Reason Eval/Treat Not Completed: Active bedrest order (OT to f/u as activity orders progress)   A  04/03/2022, 7:42 AM

## 2022-04-03 NOTE — Progress Notes (Addendum)
STROKE TEAM PROGRESS NOTE   INTERVAL HISTORY  No family at the bedside. Therapy at the bedside. He is alert and sitting up in bed in NAD. He is alert and oriented x 4.  PERRL, crosses midline, tracks. Has right visual field cut. Slight ataxia on right arm 5/5 in all 4 extremities. Decreased sensation on right  Will need loop recorder at d/c. Will add atorvastatin. Will transfer out of the unit Vital signs are stable.  Neurological exam is unchanged Vitals:   04/03/22 0400 04/03/22 0500 04/03/22 0600 04/03/22 0700  BP: 116/61 119/64 130/73 128/73  Pulse: 90 86 91 86  Resp: _0 (!) 21  Temp: 99.5 F (37.5 C)     TempSrc: Axillary     SpO2: 93% 95% 94% 92%  Weight:      Height:       CBC:  Recent Labs  Lab 04/01/22 1600 04/01/22 1608  WBC 5.8  --   NEUTROABS 2.1  --   HGB 14.8 15.6  HCT 44.5 46.0  MCV 90.6  --   PLT 188  --     Basic Metabolic Panel:  Recent Labs  Lab 04/01/22 1600 04/01/22 1608  NA 142 141  K 3.8 3.7  CL 104 103  CO2 25  --   GLUCOSE 123* 122*  BUN 12 13  CREATININE 1.29* 1.30*  CALCIUM 9.5  --     Lipid Panel:  Recent Labs  Lab 04/02/22 0514  CHOL 251*  TRIG 292*  HDL 65  CHOLHDL 3.9  VLDL 58*  LDLCALC 128*    HgbA1c:  Recent Labs  Lab 04/01/22 1600  HGBA1C 5.9*    Urine Drug Screen:  Recent Labs  Lab 04/02/22 0526  LABOPIA NONE DETECTED  COCAINSCRNUR NONE DETECTED  LABBENZ NONE DETECTED  AMPHETMU NONE DETECTED  THCU NONE DETECTED  LABBARB NONE DETECTED     Alcohol Level  Recent Labs  Lab 04/01/22 1600  ETH <10     IMAGING past 24 hours MR BRAIN WO CONTRAST  Result Date: 04/02/2022 CLINICAL DATA:  Stroke, follow up 24 hour post TNK stability scan (APPROX.4:30 PM on 7/18) EXAM: MRI HEAD WITHOUT CONTRAST TECHNIQUE: Multiplanar, multiecho pulse sequences of the brain and surrounding structures were obtained without intravenous contrast. COMPARISON:  CT head 04/01/2022. FINDINGS: Brain: Acute left PCA territory  infarcts involving the left thalamus, hippocampus, medial left temporal lobe and occipital lobe. Associated edema without significant mass effect. No midline shift. Associated petechial hemorrhage without mass occupying acute hemorrhage. No hydrocephalus, mass lesion, or extra-axial fluid collection. Vascular: Major arterial flow voids are maintained at the skull base. Skull and upper cervical spine: Normal marrow signal. Sinuses/Orbits: Clear sinuses.  No acute orbital findings. Other: No mastoid effusions. IMPRESSION: Acute left PCA territory infarcts, described above. Associated edema and petechial hemorrhage without mass occupying acute hemorrhage or significant mass effect. Electronically Signed   By: Margaretha Sheffield M.D.   On: 04/02/2022 16:00   IR CT Head Ltd  Result Date: 04/02/2022 INDICATION: 66 year old male with past medical history significant for arthritis (baseline modified Rankin scale 0) presenting with dizziness, right hemianopia, dysarthria and mild right-sided weakness. His last known well was 3 p.m. on 04/01/2022. Initially, NIHSS was 3 with improvement to NIHSS 1. However, within short interval patient declined to NIHSS 12. CT angiogram of the head and neck showed an occlusion of the proximal fetal left P2/PCA. Head CT showed no acute infarct or hemorrhage. The was brought to our  service for emergency diagnostic cerebral angiogram and mechanical thrombectomy. EXAM: DIAGNOSTIC CEREBRAL ANGIOGRAM MECHANICAL THROMBECTOMY FLAT PANEL HEAD CT COMPARISON:  CT/CT angiogram of the head and neck April 01, 2022 MEDICATIONS: No antibiotics administered. ANESTHESIA/SEDATION: The procedure was performed in the general anesthesia. CONTRAST:  90 mL of Omnipaque 300 milligram/mL FLUOROSCOPY: Radiation Exposure Index (as provided by the fluoroscopic device): 563 mGy Kerma COMPLICATIONS: None immediate. TECHNIQUE: Informed written consent was obtained from the patient's wife after a thorough discussion of  the procedural risks, benefits and alternatives. All questions were addressed. Maximal Sterile Barrier Technique was utilized including caps, mask, sterile gowns, sterile gloves, sterile drape, hand hygiene and skin antiseptic. A timeout was performed prior to the initiation of the procedure. The right groin was prepped and draped in the usual sterile fashion. Using a micropuncture kit and the modified Seldinger technique, access was gained to the right common femoral artery and an 8 French sheath was placed. Real-time ultrasound guidance was utilized for vascular access including the acquisition of a permanent ultrasound image documenting patency of the accessed vessel. Under fluoroscopy, a Zoom 88 guide catheter was navigated over a 6 [place] Berenstein 2 catheter and a 0.035" Terumo Glidewire into the aortic arch. The catheter was placed into the left common carotid artery and then advanced into the left internal carotid artery. The diagnostic catheter was removed. Left anterior oblique angiograms of the head was obtained. FINDINGS: 1. Normal caliber of the right common femoral artery, adequate vascular access. 2. Fetal left PCA with occlusion of the proximal left P2 segment. 3. Patent left MCA and ACA vascular trees. PROCEDURE: Using biplane roadmap, a Zoom 55 aspiration catheter was navigated over an Aristotle 24 microguidewire into the cavernous segment of the right ICA. The aspiration catheter was then advanced to the level of occlusion and connected to an aspiration pump. Continuous aspiration was performed for 2 minutes. The guide catheter was connected to a VacLok syringe. The aspiration catheter was subsequently removed under constant aspiration. The guide catheter was aspirated for debris. Left internal carotid artery angiogram with left anterior oblique view of the head was obtained. Recanalization of the left PCA is noted with small, non flow limiting filling defect noted in the P2 segment. Delayed left  internal carotid artery angiograms showed increase in size of left P2/PCA filling defect with persistent brisk anterograde flow. Using biplane roadmap, a Zoom 55 aspiration catheter was navigated over an Aristotle 24 microguidewire into the cavernous segment of the right ICA. The aspiration catheter was then advanced to the level of occlusion and connected to an aspiration pump. Continuous aspiration was performed for 2 minutes. The guide catheter was connected to a VacLok syringe. The aspiration catheter was subsequently removed under constant aspiration. The guide catheter was aspirated for debris. Left internal carotid artery angiogram with left anterior oblique view of the head was obtained. Decreased size of the filling defect noted. However, delayed angiograms showed worsening of the filling defect with distal slow flow. Using biplane roadmap guidance, a zoom 55 aspiration catheter was navigated over a phenom 21 microcatheter and a Aristotle 18 microguidewire into the cavernous segment of the left ICA. The microcatheter was then navigated over the wire into the left P3/PCA segment. Then, a 3 x 20 mm solitaire stent retriever was deployed spanning the P2. The device was allowed to intercalated with the clot for 4 minutes. The microcatheter was removed. The aspiration catheter was advanced to the level of occlusion and connected to an aspiration pump. The thrombectomy  device and aspiration catheter were removed under constant aspiration. Left internal carotid artery angiogram left anterior oblique view showed complete recanalization of the left PCA without residual filling defect. Flat panel CT of the head was obtained and post processed in a separate workstation with concurrent attending physician supervision. Selected images were sent to PACS. No evidence of hemorrhagic complication. The catheter was removed from the left internal carotid artery and then advanced into the left subclavian artery. Coaxial  navigation of a 6 [place] Berenstein into the left vertebral artery was performed. Frontal angiogram of the head was obtained. Compartment patency of the left vertebral artery, basilar artery and right PCA. No opacification of the left PCA due to direct origin from the left internal carotid artery (fetal PCA). The catheter was subsequently withdrawn. Right common femoral artery angiogram was obtained in right anterior oblique view. The puncture is at the level of the common femoral artery. The artery has normal caliber, adequate for closure device. The sheath was exchanged over the wire for a Perclose ProGlide which was utilized for access closure. Immediate hemostasis was achieved. IMPRESSION: Successful mechanical thrombectomy for treatment of a proximal occlusion of the P2 segment of the fetal left PCA. No evidence of thromboembolic or hemorrhagic complication. PLAN: Transfer to ICU for continued post stroke care. Electronically Signed   By: Pedro Earls M.D.   On: 04/02/2022 14:44   ECHOCARDIOGRAM COMPLETE  Result Date: 04/02/2022    ECHOCARDIOGRAM REPORT   Patient Name:   CHARVIS LIGHTNER Date of Exam: 04/02/2022 Medical Rec #:  [id number]     Height:       68.0 in Accession #:    [id number]    Weight:       193.6 lb Date of Birth:  1956-07-15     BSA:          2.016 m Patient Age:    19 years      BP:           135/77 mmHg Patient Gender: M             HR:           96 bpm. Exam Location:  Inpatient Procedure: 2D Echo, Cardiac Doppler and Color Doppler Indications:    Stroke  History:        Patient has no prior history of Echocardiogram examinations.  Sonographer:    Jyl Heinz Referring Phys: [id number] [place]  1. Left ventricular ejection fraction, by estimation, is 60 to 65%. The left ventricle has normal function. The left ventricle has no regional wall motion abnormalities. Left ventricular diastolic parameters were normal.  2. Right ventricular systolic function is  normal. The right ventricular size is normal. Tricuspid regurgitation signal is inadequate for assessing PA pressure.  3. The mitral valve is grossly normal. Trivial mitral valve regurgitation. No evidence of mitral stenosis.  4. The aortic valve is tricuspid. Aortic valve regurgitation is not visualized. No aortic stenosis is present.  5. The inferior vena cava is normal in size with greater than 50% respiratory variability, suggesting right atrial pressure of 3 mmHg. Conclusion(s)/Recommendation(s): No intracardiac source of embolism detected on this transthoracic study. Consider a transesophageal echocardiogram to exclude cardiac source of embolism if clinically indicated. FINDINGS  Left Ventricle: Left ventricular ejection fraction, by estimation, is 60 to 65%. The left ventricle has normal function. The left ventricle has no regional wall motion abnormalities. The left ventricular internal cavity size was normal in size.  There is  no left ventricular hypertrophy. Left ventricular diastolic parameters were normal. Right Ventricle: The right ventricular size is normal. No increase in right ventricular wall thickness. Right ventricular systolic function is normal. Tricuspid regurgitation signal is inadequate for assessing PA pressure. Left Atrium: Left atrial size was normal in size. Right Atrium: Right atrial size was normal in size. Pericardium: Trivial pericardial effusion is present. Mitral Valve: The mitral valve is grossly normal. Trivial mitral valve regurgitation. No evidence of mitral valve stenosis. Tricuspid Valve: The tricuspid valve is grossly normal. Tricuspid valve regurgitation is trivial. No evidence of tricuspid stenosis. Aortic Valve: The aortic valve is tricuspid. Aortic valve regurgitation is not visualized. No aortic stenosis is present. Aortic valve peak gradient measures 9.4 mmHg. Pulmonic Valve: The pulmonic valve was grossly normal. Pulmonic valve regurgitation is not visualized. No  evidence of pulmonic stenosis. Aorta: The aortic root and ascending aorta are structurally normal, with no evidence of dilitation. Venous: The inferior vena cava is normal in size with greater than 50% respiratory variability, suggesting right atrial pressure of 3 mmHg. IAS/Shunts: The atrial septum is grossly normal.  LEFT VENTRICLE PLAX 2D LVIDd:         4.40 cm      Diastology LVIDs:         2.90 cm      LV e' medial:    8.08 cm/s LV PW:         1.00 cm      LV E/e' medial:  8.7 LV IVS:        1.00 cm      LV e' lateral:   9.32 cm/s LVOT diam:     2.10 cm      LV E/e' lateral: 7.5 LV SV:         87 LV SV Index:   43 LVOT Area:     3.46 cm  LV Volumes (MOD) LV vol d, MOD A2C: 114.0 ml LV vol d, MOD A4C: 121.0 ml LV vol s, MOD A2C: 42.6 ml LV vol s, MOD A4C: 44.9 ml LV SV MOD A2C:     71.4 ml LV SV MOD A4C:     121.0 ml LV SV MOD BP:      75.5 ml RIGHT VENTRICLE             IVC RV Basal diam:  2.40 cm     IVC diam: 1.30 cm RV Mid diam:    2.10 cm RV S prime:     17.00 cm/s TAPSE (M-mode): 2.2 cm LEFT ATRIUM             Index        RIGHT ATRIUM          Index LA diam:        3.00 cm 1.49 cm/m   RA Area:     9.14 cm LA Vol (A2C):   28.8 ml 14.29 ml/m  RA Volume:   13.40 ml 6.65 ml/m LA Vol (A4C):   26.5 ml 13.15 ml/m LA Biplane Vol: 27.4 ml 13.59 ml/m  AORTIC VALVE AV Area (Vmax): 3.46 cm AV Vmax:        153.00 cm/s AV Peak Grad:   9.4 mmHg LVOT Vmax:      153.00 cm/s LVOT Vmean:     96.600 cm/s LVOT VTI:       0.250 m  AORTA Ao Root diam: 3.80 cm Ao Asc diam:  3.60 cm MITRAL VALVE MV Area (PHT):  5.70 cm    SHUNTS MV Decel Time: 133 msec    Systemic VTI:  0.25 m MV E velocity: 70.30 cm/s  Systemic Diam: 2.10 cm MV A velocity: 84.40 cm/s MV E/A ratio:  0.83 Eleonore Chiquito MD Electronically signed by Eleonore Chiquito MD Signature Date/Time: 04/02/2022/2:36:26 PM    Final     PHYSICAL EXAM Constitutional: Appears well-developed and well-nourished, muscular middle-aged African-American male.  Psych: Affect  appropriate to situation, calm and cooperative Eyes: No scleral injection HENT: No oropharyngeal obstruction.  MSK: no joint deformities.  Cardiovascular: Normal rate and regular rhythm. Perfusing extremities well Respiratory: Effort normal, non-labored breathing GI: Soft.  No distension. There is no tenderness.  Skin: Warm dry and intact visible skin   Neurological Exam: He is alert and oriented x 4.  PERRL, crosses midline, tracks. Has right visual field cut. Slight ataxia on right arm 5/5 in all 4 extremities. Decreased sensation on right diminished fine finger movements on the right.  Orbits left or right upper extremity.  No focal weakness  ASSESSMENT/PLAN Zaniel Marineau is a 66 y.o. male with a past medical history significant for arthritis, no other known medical problems and not on any medications.   He has been in his usual state of health and had been feeling well including mowing the lawn this morning.  He had gone to the gym and was working out his leg muscles.  He got up and adjusted the weights (was not in the middle of straining), when he suddenly felt dizzy and had to sit down on the weight bench.  He noted he could not see on the right side.  On EMS arrival they noted that he had significant right arm and leg weakness as well.    He initially had an NIH stroke scale of 3 (partial hemianopia on the right side, right leg drift, mild dysarthria, one-point each).  TNK checklist was reviewed and patient was consented with appropriate delivery of TNK after negative head CT.  Subsequently he developed worsening symptoms with an NIH as high as 12 (2 points for drowsiness, 2 points for full right hemianopia, 3 points for right arm weakness, 3 points for right leg weakness, one-point for sensory loss on the right side, one-point for dysarthria).  Head CT was repeated and did not show any hemorrhage. Initial CTA was poor diagnostic quality and therefore was repeated and demonstrated left P2  occlusion for which risks and benefits of thrombectomy were discussed with wife.  She agreed that he would want thrombectomy and patient also assented.  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 Short-segment critical stenosis/occlusion of the left PCA with reconstitution. No other definite occlusion is seen in the left PCA. 2. Otherwise patent intracranial vasculature. The previously suspected left M2 stenosis/occlusion is not seen on the current study and was likely artifactual. 3. Patent vasculature of the neck with no hemodynamically significant stenosis or occlusion.   Post IR CT  2nd procedure: Reocclusion of the proximal left P2/PCA. One direct contact aspiration pass performed with minimal recanalization (TICI 1). Cangrelor load dose administered and a 4 x 24 mm neuroform atlas stent was deployed across the occlusion with complete recanalization. No thromboembolic or hemorrhagic complication. Occlusion of the proximal P2 segment of the fetal  left PCA.   1st procedure: Mechanical thrombectomy performed with direct contact aspiration with complete recanalization. Small non occlusive filling defect noted. One additional aspiration pass performed with persistent filling defect and eventual reocclusion. One pass with direct contact aspiration and stent retriever performed with complete recanalization without residual filling defect. No thromboembolic or hemorrhagic complication.   MRI  Acute left PCA territory infarcts, described above. Associated edema and petechial hemorrhage without mass occupying acute hemorrhage or significant mass effect. 2D Echo 60-65%, no thrombus or shunt identified Recommend Loop recorder  LDL 128 HgbA1c 5.9 VTE prophylaxis - Recommended      Diet   Diet regular Room service appropriate? Yes; Fluid consistency: Thin  No AC/AP prior to admission Loaded with ASA and s/p IR x2 with ticagrelor infusions *AC/AP plan TBD after MR completed * Therapy recommendations: Outpatient OT Continue cardiac monitoring Disposition:  TBD  BP management No reported history of HTN, one episode of hypotension early am and now hypertensive to 160s off drip cleviprex drip off Defer to neurovascular team initial post thrombectomy period for BP goals Long-term BP goal normotensive  Hyperlipidemia Home meds:  None known at present LDL 128, not at goal < 70 Add atorvastatin 80 mg  Continue High intensity statin   Other Stroke Risk Factors Advanced Age >/= 27    Hospital day # 2  Beulah Gandy DNP, ACNPC-AG   I have personally obtained history,examined this patient, reviewed notes, independently viewed imaging studies, participated in medical decision making and plan of care.ROS completed by me personally and pertinent positives fully documented  I have made any additions or clarifications directly to the above note. Agree with note above.  Patient remains neurologically stable. Mobilize out of bed.  Continue physical Occupational Therapy consults.  Continue aspirin and ticagrelor for 6 months then aspirin alone.  Plan loop recorder at discharge.  Transfer to neurology floor bed.  Patient advised not to drive till peripheral vision improves.   This patient is critically ill and at significant risk of neurological worsening, death and care requires constant monitoring of vital signs, hemodynamics,respiratory and cardiac monitoring, extensive review of multiple databases, frequent neurological assessment, discussion with family, other specialists and medical decision making of high complexity.I have made any additions or clarifications directly to the above note.This critical care time does not reflect procedure time, or teaching time or supervisory  time of PA/NP/Med Resident etc but could involve care discussion time.  I spent 30 minutes of neurocritical care time  in the care of  this patient.      Antony Contras, MD Medical Director [facility] Pager: [phone] 04/03/2022 3:56 PM   To contact Stroke Continuity provider, please refer to [email or web address] After hours, contact General Neurology

## 2022-04-03 NOTE — Progress Notes (Signed)
Referring Physician(s): CODE STROKE   Supervising Physician: Pedro Earls  Patient Status:  [facility] - In-pt  Chief Complaint: S/p (L) PCA thrombectomy with subsequent reocclusion of the proximal left P2/PCA. Taken back to [facility] for repeat intervention with one direct contact aspiration pass performed with minimal recanalization (TICI 1). Cangrelor load dose administered and a 4 x 24 mm neuroform atlas stent was deployed across the occlusion with complete recanalization. No thromboembolic or hemorrhagic complication.  Subjective: Patient sitting up in bed with his wife and daughter at the bedside. He denies pain/discomfort. He endorses some right sided numbness/tingling.   Allergies: Other  Medications: Prior to Admission medications   Medication Sig Start Date End Date Taking? Authorizing Provider  meloxicam (MOBIC) 15 MG tablet Take 15 mg by mouth daily. 03/05/22  Yes [provider]  Multiple Vitamins-Minerals (MULTIVITAMIN MEN 50+) TABS Take 1 tablet by mouth daily.   Yes [provider]     Vital Signs: BP (!) 141/63   Pulse 81   Temp 98.7 F (37.1 C) (Oral)   Resp 11   Ht _0  (1.727 m)   Wt 193 lb 9 oz (87.8 kg)   SpO2 97%   BMI 29.43 kg/m   Physical Exam Constitutional:      General: He is not in acute distress.    Appearance: He is not ill-appearing.  Cardiovascular:     Rate and Rhythm: Normal rate and regular rhythm.  Pulmonary:     Effort: Pulmonary effort is normal.  Skin:    General: Skin is warm and dry.  Neurological:     Mental Status: He is alert and oriented to person, place, and time.    Imaging: MR BRAIN WO CONTRAST  Result Date: 04/02/2022 CLINICAL DATA:  Stroke, follow up 24 hour post TNK stability scan (APPROX.4:30 PM on 7/18) EXAM: MRI HEAD WITHOUT CONTRAST TECHNIQUE: Multiplanar, multiecho pulse sequences of the brain and surrounding structures were obtained without intravenous contrast. COMPARISON:  CT head  04/01/2022. 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 Associated edema without significant mass effect. No midline shift. Associated petechial hemorrhage without mass occupying acute hemorrhage. No hydrocephalus, mass lesion, or extra-axial fluid collection. Vascular: Major arterial flow voids are maintained at the skull base. Skull and upper cervical spine: Normal marrow signal. Sinuses/Orbits: Clear sinuses.  No acute orbital findings. Other: No mastoid effusions. IMPRESSION: Acute left PCA territory infarcts, described above. Associated edema and petechial hemorrhage without mass occupying acute hemorrhage or significant mass effect. Electronically Signed   By: Margaretha Sheffield M.D.   On: 04/02/2022 16:00   IR PERCUTANEOUS ART THROMBECTOMY/INFUSION INTRACRANIAL INC DIAG ANGIO  Result Date: 04/02/2022 INDICATION: 66 year old male with past medical history significant for arthritis (baseline modified Rankin scale 0) who presented earlier today with dizziness, right hemianopia, dysarthria and mild right-sided weakness. His NIHSS was fluctuating between 3 and 12. He was taken to mechanical thrombectomy with recanalization of a fetal left P2/PCA. He responded well to treatment with significant improvement of symptoms. However, around 9 p.m. on 04/01/2022, patient at the sudden declining neurological exam with right upper extremity weakness and vision loss. Repeat CT angiogram of the head and neck showed reocclusion of the left P2/PCA segment. He was then taken to our service for a diagnostic cerebral angiogram and left PCA revascularization. EXAM: ULTRASOUND-GUIDED VASCULAR ACCESS DIAGNOSTIC CEREBRAL ANGIOGRAM MECHANICAL THROMBECTOMY INTRACRANIAL STENTING FLAT PANEL HEAD CT COMPARISON:  Cerebral angiography April 01, 2022; CT angiogram of the head and neck April 01, 2022. MEDICATIONS: Cangrelor IV bolus and drip.  Ticagrelor 180 mg and aspirin 81 mg via OG tube. ANESTHESIA/SEDATION: The procedure was performed under general anesthesia. CONTRAST:  65 mL of Omnipaque 300 milligram/mL FLUOROSCOPY: Radiation Exposure Index (as provided by the fluoroscopic device): 2482 mGy Kerma COMPLICATIONS: None immediate. TECHNIQUE: Informed written consent was obtained from the patient's wife after a thorough discussion of the procedural risks, benefits and alternatives. All questions were addressed. Maximal Sterile Barrier Technique was utilized including caps, mask, sterile gowns, sterile gloves, sterile drape, hand hygiene and skin antiseptic. A timeout was performed prior to the initiation of the procedure. The right groin was prepped and draped in the usual sterile fashion. Using a micropuncture kit and the modified Seldinger technique, access was gained to the right common femoral artery and a 6 French sheath was placed. Real-time ultrasound guidance was utilized for vascular access including the acquisition of a permanent ultrasound image documenting patency of the accessed vessel. Under fluoroscopy, benchmark guide catheter was navigated over a 5 [place] Berenstein 2 catheter and a 0.035" Terumo Glidewire into the aortic arch. The catheter was placed into the left common carotid artery and then advanced into the left internal carotid artery. The diagnostic catheter was removed. Frontal and lateral angiograms of the head were obtained. FINDINGS: 1. Normal caliber of the right common femoral artery, adequate for vascular access. 2. Reocclusion of the fetal left P2/PCA. PROCEDURE: Using biplane roadmap, a 5 [place] Sof[place] aspiration catheter was navigated over an SL 10 microcatheter and an Aristotle 14 microguidewire into the cavernous segment of the left ICA. The aspiration catheter was then advanced to the level of occlusion in the left P2/PCA segment and connected to an aspiration pump. Continuous aspiration was performed for 2 minutes.  The guide catheter was connected to a VacLok syringe. The aspiration catheter was subsequently removed under constant aspiration. The guide catheter was aspirated for debris. Left internal carotid artery angiograms with frontal and lateral views of the head showed minimal recanalization with persistent distal occlusion (TICI 1). Patient received a loading dose of cangrelor followed by continuous drip. Left internal carotid artery angiograms with left anterior oblique and lateral views of the head were obtained in utilized as biplane roadmap. Then, the [place] aspiration catheter was navigated over an SL 10 microcatheter and an Aristotle 14 microguidewire into the cavernous segment of the left ICA. The microcatheter was then navigated over the wire into the distal left P3/PCA segment. Frontal and lateral angiograms of the head were obtained via microcatheter contrast injection confirming adequate position of the microcatheter, distal to the occlusion. Next, a 4 x 24 mm neuroform atlas intracranial stent was deployed in the left P2 segment, across the occluded segment. Left internal carotid artery angiograms with frontal and lateral views of the head showed complete recanalization of the left PCA vascular tree (TICI3). Delayed angiograms showed preserved patency of the recently deployed stent. Flat panel CT of the head was obtained and post processed in a separate workstation with concurrent attending physician supervision. Selected images were sent to PACS. No evidence of hemorrhagic complication. At this point, and orogastric tube was placed and 180 mg of ticagrelor and 81 mg of aspirin were administered. Delayed angiograms showed preserved patency of the intracranial stent. Right common femoral artery angiogram was obtained in right anterior oblique view. The puncture is at the level of the common femoral artery. The artery has  normal caliber, adequate for closure device. The sheath was exchanged over the  wire for a 6 [place] Angio-Seal which was utilized for access closure. Immediate hemostasis was achieved. IMPRESSION: Mechanical thrombectomy with direct contact aspiration rescue intracranial stenting performed for left P2/PCA reocclusion with complete recanalization (TICI3). No thromboembolic or hemorrhagic complication. PLAN: Transfer back to ICU for continued post stroke care. Electronically Signed   By: Pedro Earls M.D.   On: 04/02/2022 15:50   IR US Guide Vasc Access Right  Result Date: 04/02/2022 INDICATION: 66 year old male with past medical history significant for arthritis (baseline modified Rankin scale 0) who presented earlier today with dizziness, right hemianopia, dysarthria and mild right-sided weakness. His NIHSS was fluctuating between 3 and 12. He was taken to mechanical thrombectomy with recanalization of a fetal left P2/PCA. He responded well to treatment with significant improvement of symptoms. However, around 9 p.m. on 04/01/2022, patient at the sudden declining neurological exam with right upper extremity weakness and vision loss. Repeat CT angiogram of the head and neck showed reocclusion of the left P2/PCA segment. He was then taken to our service for a diagnostic cerebral angiogram and left PCA revascularization. EXAM: ULTRASOUND-GUIDED VASCULAR ACCESS DIAGNOSTIC CEREBRAL ANGIOGRAM MECHANICAL THROMBECTOMY INTRACRANIAL STENTING FLAT PANEL HEAD CT COMPARISON:  Cerebral angiography April 01, 2022; CT angiogram of the head and neck April 01, 2022. MEDICATIONS: Cangrelor IV bolus and drip. Ticagrelor 180 mg and aspirin 81 mg via OG tube. ANESTHESIA/SEDATION: The procedure was performed under general anesthesia. CONTRAST:  65 mL of Omnipaque 300 milligram/mL FLUOROSCOPY: Radiation Exposure Index (as provided by the fluoroscopic device): 1448 mGy Kerma COMPLICATIONS: None immediate. TECHNIQUE: Informed written consent was obtained from the patient's wife after a thorough  discussion of the procedural risks, benefits and alternatives. All questions were addressed. Maximal Sterile Barrier Technique was utilized including caps, mask, sterile gowns, sterile gloves, sterile drape, hand hygiene and skin antiseptic. A timeout was performed prior to the initiation of the procedure. The right groin was prepped and draped in the usual sterile fashion. Using a micropuncture kit and the modified Seldinger technique, access was gained to the right common femoral artery and a 6 French sheath was placed. Real-time ultrasound guidance was utilized for vascular access including the acquisition of a permanent ultrasound image documenting patency of the accessed vessel. Under fluoroscopy, benchmark guide catheter was navigated over a 5 [place] Berenstein 2 catheter and a 0.035" Terumo Glidewire into the aortic arch. The catheter was placed into the left common carotid artery and then advanced into the left internal carotid artery. The diagnostic catheter was removed. Frontal and lateral angiograms of the head were obtained. FINDINGS: 1. Normal caliber of the right common femoral artery, adequate for vascular access. 2. Reocclusion of the fetal left P2/PCA. PROCEDURE: Using biplane roadmap, a 5 [place] Sofia aspiration catheter was navigated over an SL 10 microcatheter and an Aristotle 14 microguidewire into the cavernous segment of the left ICA. The aspiration catheter was then advanced to the level of occlusion in the left P2/PCA segment and connected to an aspiration pump. Continuous aspiration was performed for 2 minutes. The guide catheter was connected to a VacLok syringe. The aspiration catheter was subsequently removed under constant aspiration. The guide catheter was aspirated for debris. Left internal carotid artery angiograms with frontal and lateral views of the head showed minimal recanalization with persistent distal occlusion (TICI 1). Patient received a loading dose of cangrelor followed  by continuous drip. Left internal carotid artery angiograms with left  anterior oblique and lateral views of the head were obtained in utilized as biplane roadmap. Then, the [place] aspiration catheter was navigated over an SL 10 microcatheter and an Aristotle 14 microguidewire into the cavernous segment of the left ICA. The microcatheter was then navigated over the wire into the distal left P3/PCA segment. Frontal and lateral angiograms of the head were obtained via microcatheter contrast injection confirming adequate position of the microcatheter, distal to the occlusion. Next, a 4 x 24 mm neuroform atlas intracranial stent was deployed in the left P2 segment, across the occluded segment. Left internal carotid artery angiograms with frontal and lateral views of the head showed complete recanalization of the left PCA vascular tree (TICI3). Delayed angiograms showed preserved patency of the recently deployed stent. Flat panel CT of the head was obtained and post processed in a separate workstation with concurrent attending physician supervision. Selected images were sent to PACS. No evidence of hemorrhagic complication. At this point, and orogastric tube was placed and 180 mg of ticagrelor and 81 mg of aspirin were administered. Delayed angiograms showed preserved patency of the intracranial stent. Right common femoral artery angiogram was obtained in right anterior oblique view. The puncture is at the level of the common femoral artery. The artery has normal caliber, adequate for closure device. The sheath was exchanged over the wire for a 6 [place] Angio-Seal which was utilized for access closure. Immediate hemostasis was achieved. IMPRESSION: Mechanical thrombectomy with direct contact aspiration rescue intracranial stenting performed for left P2/PCA reocclusion with complete recanalization (TICI3). No thromboembolic or hemorrhagic complication. PLAN: Transfer back to ICU for continued post stroke care.  Electronically Signed   By: Pedro Earls M.D.   On: 04/02/2022 15:50   IR CT Head Ltd  Result Date: 04/02/2022 INDICATION: 65 year old male with past medical history significant for arthritis (baseline modified Rankin scale 0) who presented earlier today with dizziness, right hemianopia, dysarthria and mild right-sided weakness. His NIHSS was fluctuating between 3 and 12. He was taken to mechanical thrombectomy with recanalization of a fetal left P2/PCA. He responded well to treatment with significant improvement of symptoms. However, around 9 p.m. on 04/01/2022, patient at the sudden declining neurological exam with right upper extremity weakness and vision loss. Repeat CT angiogram of the head and neck showed reocclusion of the left P2/PCA segment. He was then taken to our service for a diagnostic cerebral angiogram and left PCA revascularization. EXAM: ULTRASOUND-GUIDED VASCULAR ACCESS DIAGNOSTIC CEREBRAL ANGIOGRAM MECHANICAL THROMBECTOMY INTRACRANIAL STENTING FLAT PANEL HEAD CT COMPARISON:  Cerebral angiography April 01, 2022; CT angiogram of the head and neck April 01, 2022. MEDICATIONS: Cangrelor IV bolus and drip. Ticagrelor 180 mg and aspirin 81 mg via OG tube. ANESTHESIA/SEDATION: The procedure was performed under general anesthesia. CONTRAST:  65 mL of Omnipaque 300 milligram/mL FLUOROSCOPY: Radiation Exposure Index (as provided by the fluoroscopic device): 2355 mGy Kerma COMPLICATIONS: None immediate. TECHNIQUE: Informed written consent was obtained from the patient's wife after a thorough discussion of the procedural risks, benefits and alternatives. All questions were addressed. Maximal Sterile Barrier Technique was utilized including caps, mask, sterile gowns, sterile gloves, sterile drape, hand hygiene and skin antiseptic. A timeout was performed prior to the initiation of the procedure. The right groin was prepped and draped in the usual sterile fashion. Using a micropuncture kit  and the modified Seldinger technique, access was gained to the right common femoral artery and a 6 French sheath was placed. Real-time ultrasound guidance was utilized for vascular  access including the acquisition of a permanent ultrasound image documenting patency of the accessed vessel. Under fluoroscopy, benchmark guide catheter was navigated over a 5 [place] Berenstein 2 catheter and a 0.035" Terumo Glidewire into the aortic arch. The catheter was placed into the left common carotid artery and then advanced into the left internal carotid artery. The diagnostic catheter was removed. Frontal and lateral angiograms of the head were obtained. FINDINGS: 1. Normal caliber of the right common femoral artery, adequate for vascular access. 2. Reocclusion of the fetal left P2/PCA. PROCEDURE: Using biplane roadmap, a 5 [place] Sof[place] aspiration catheter was navigated over an SL 10 microcatheter and an Aristotle 14 microguidewire into the cavernous segment of the left ICA. The aspiration catheter was then advanced to the level of occlusion in the left P2/PCA segment and connected to an aspiration pump. Continuous aspiration was performed for 2 minutes. The guide catheter was connected to a VacLok syringe. The aspiration catheter was subsequently removed under constant aspiration. The guide catheter was aspirated for debris. Left internal carotid artery angiograms with frontal and lateral views of the head showed minimal recanalization with persistent distal occlusion (TICI 1). Patient received a loading dose of cangrelor followed by continuous drip. Left internal carotid artery angiograms with left anterior oblique and lateral views of the head were obtained in utilized as biplane roadmap. Then, the [place] aspiration catheter was navigated over an SL 10 microcatheter and an Aristotle 14 microguidewire into the cavernous segment of the left ICA. The microcatheter was then navigated over the wire into the distal left  P3/PCA segment. Frontal and lateral angiograms of the head were obtained via microcatheter contrast injection confirming adequate position of the microcatheter, distal to the occlusion. Next, a 4 x 24 mm neuroform atlas intracranial stent was deployed in the left P2 segment, across the occluded segment. Left internal carotid artery angiograms with frontal and lateral views of the head showed complete recanalization of the left PCA vascular tree (TICI3). Delayed angiograms showed preserved patency of the recently deployed stent. Flat panel CT of the head was obtained and post processed in a separate workstation with concurrent attending physician supervision. Selected images were sent to PACS. No evidence of hemorrhagic complication. At this point, and orogastric tube was placed and 180 mg of ticagrelor and 81 mg of aspirin were administered. Delayed angiograms showed preserved patency of the intracranial stent. Right common femoral artery angiogram was obtained in right anterior oblique view. The puncture is at the level of the common femoral artery. The artery has normal caliber, adequate for closure device. The sheath was exchanged over the wire for a 6 [place] Angio-Seal which was utilized for access closure. Immediate hemostasis was achieved. IMPRESSION: Mechanical thrombectomy with direct contact aspiration rescue intracranial stenting performed for left P2/PCA reocclusion with complete recanalization (TICI3). No thromboembolic or hemorrhagic complication. PLAN: Transfer back to ICU for continued post stroke care. Electronically Signed   By: Pedro Earls M.D.   On: 04/02/2022 15:50   IR PERCUTANEOUS ART THROMBECTOMY/INFUSION INTRACRANIAL INC DIAG ANGIO  Result Date: 04/02/2022 INDICATION: 66 year old male with past medical history significant for arthritis (baseline modified Rankin scale 0) presenting with dizziness, right hemianopia, dysarthria and mild right-sided weakness. His last known  well was 3 p.m. on 04/01/2022. Initially, NIHSS was 3 with improvement to NIHSS 1. However, within short interval patient declined to NIHSS 12. CT angiogram of the head and neck showed an occlusion of the proximal fetal left P2/PCA. Head CT showed  no acute infarct or hemorrhage. The was brought to our service for emergency diagnostic cerebral angiogram and mechanical thrombectomy. EXAM: DIAGNOSTIC CEREBRAL ANGIOGRAM MECHANICAL THROMBECTOMY FLAT PANEL HEAD CT COMPARISON:  CT/CT angiogram of the head and neck April 01, 2022 MEDICATIONS: No antibiotics administered. ANESTHESIA/SEDATION: The procedure was performed in the general anesthesia. CONTRAST:  90 mL of Omnipaque 300 milligram/mL FLUOROSCOPY: Radiation Exposure Index (as provided by the fluoroscopic device): 194 mGy Kerma COMPLICATIONS: None immediate. TECHNIQUE: Informed written consent was obtained from the patient's wife after a thorough discussion of the procedural risks, benefits and alternatives. All questions were addressed. Maximal Sterile Barrier Technique was utilized including caps, mask, sterile gowns, sterile gloves, sterile drape, hand hygiene and skin antiseptic. A timeout was performed prior to the initiation of the procedure. The right groin was prepped and draped in the usual sterile fashion. Using a micropuncture kit and the modified Seldinger technique, access was gained to the right common femoral artery and an 8 French sheath was placed. Real-time ultrasound guidance was utilized for vascular access including the acquisition of a permanent ultrasound image documenting patency of the accessed vessel. Under fluoroscopy, a Zoom 88 guide catheter was navigated over a 6 [place] Berenstein 2 catheter and a 0.035" Terumo Glidewire into the aortic arch. The catheter was placed into the left common carotid artery and then advanced into the left internal carotid artery. The diagnostic catheter was removed. Left anterior oblique angiograms of the head  was obtained. FINDINGS: 1. Normal caliber of the right common femoral artery, adequate vascular access. 2. Fetal left PCA with occlusion of the proximal left P2 segment. 3. Patent left MCA and ACA vascular trees. PROCEDURE: Using biplane roadmap, a Zoom 55 aspiration catheter was navigated over an Aristotle 24 microguidewire into the cavernous segment of the right ICA. The aspiration catheter was then advanced to the level of occlusion and connected to an aspiration pump. Continuous aspiration was performed for 2 minutes. The guide catheter was connected to a VacLok syringe. The aspiration catheter was subsequently removed under constant aspiration. The guide catheter was aspirated for debris. Left internal carotid artery angiogram with left anterior oblique view of the head was obtained. Recanalization of the left PCA is noted with small, non flow limiting filling defect noted in the P2 segment. Delayed left internal carotid artery angiograms showed increase in size of left P2/PCA filling defect with persistent brisk anterograde flow. Using biplane roadmap, a Zoom 55 aspiration catheter was navigated over an Aristotle 24 microguidewire into the cavernous segment of the right ICA. The aspiration catheter was then advanced to the level of occlusion and connected to an aspiration pump. Continuous aspiration was performed for 2 minutes. The guide catheter was connected to a VacLok syringe. The aspiration catheter was subsequently removed under constant aspiration. The guide catheter was aspirated for debris. Left internal carotid artery angiogram with left anterior oblique view of the head was obtained. Decreased size of the filling defect noted. However, delayed angiograms showed worsening of the filling defect with distal slow flow. Using biplane roadmap guidance, a zoom 55 aspiration catheter was navigated over a phenom 21 microcatheter and a Aristotle 18 microguidewire into the cavernous segment of the left ICA.  The microcatheter was then navigated over the wire into the left P3/PCA segment. Then, a 3 x 20 mm solitaire stent retriever was deployed spanning the P2. The device was allowed to intercalated with the clot for 4 minutes. The microcatheter was removed. The aspiration catheter was advanced to the level  of occlusion and connected to an aspiration pump. The thrombectomy device and aspiration catheter were removed under constant aspiration. Left internal carotid artery angiogram left anterior oblique view showed complete recanalization of the left PCA without residual filling defect. Flat panel CT of the head was obtained and post processed in a separate workstation with concurrent attending physician supervision. Selected images were sent to PACS. No evidence of hemorrhagic complication. The catheter was removed from the left internal carotid artery and then advanced into the left subclavian artery. Coaxial navigation of a 6 [place] Berenstein into the left vertebral artery was performed. Frontal angiogram of the head was obtained. Compartment patency of the left vertebral artery, basilar artery and right PCA. No opacification of the left PCA due to direct origin from the left internal carotid artery (fetal PCA). The catheter was subsequently withdrawn. Right common femoral artery angiogram was obtained in right anterior oblique view. The puncture is at the level of the common femoral artery. The artery has normal caliber, adequate for closure device. The sheath was exchanged over the wire for a Perclose ProGlide which was utilized for access closure. Immediate hemostasis was achieved. IMPRESSION: Successful mechanical thrombectomy for treatment of a proximal occlusion of the P2 segment of the fetal left PCA. No evidence of thromboembolic or hemorrhagic complication. PLAN: Transfer to ICU for continued post stroke care. Electronically Signed   By: Pedro Earls M.D.   On: 04/02/2022 14:44   IR US  Guide Vasc Access Right  Result Date: 04/02/2022 INDICATION: 66 year old male with past medical history significant for arthritis (baseline modified Rankin scale 0) presenting with dizziness, right hemianopia, dysarthria and mild right-sided weakness. His last known well was 3 p.m. on 04/01/2022. Initially, NIHSS was 3 with improvement to NIHSS 1. However, within short interval patient declined to NIHSS 12. CT angiogram of the head and neck showed an occlusion of the proximal fetal left P2/PCA. Head CT showed no acute infarct or hemorrhage. The was brought to our service for emergency diagnostic cerebral angiogram and mechanical thrombectomy. EXAM: DIAGNOSTIC CEREBRAL ANGIOGRAM MECHANICAL THROMBECTOMY FLAT PANEL HEAD CT COMPARISON:  CT/CT angiogram of the head and neck April 01, 2022 MEDICATIONS: No antibiotics administered. ANESTHESIA/SEDATION: The procedure was performed in the general anesthesia. CONTRAST:  90 mL of Omnipaque 300 milligram/mL FLUOROSCOPY: Radiation Exposure Index (as provided by the fluoroscopic device): 299 mGy Kerma COMPLICATIONS: None immediate. TECHNIQUE: Informed written consent was obtained from the patient's wife after a thorough discussion of the procedural risks, benefits and alternatives. All questions were addressed. Maximal Sterile Barrier Technique was utilized including caps, mask, sterile gowns, sterile gloves, sterile drape, hand hygiene and skin antiseptic. A timeout was performed prior to the initiation of the procedure. The right groin was prepped and draped in the usual sterile fashion. Using a micropuncture kit and the modified Seldinger technique, access was gained to the right common femoral artery and an 8 French sheath was placed. Real-time ultrasound guidance was utilized for vascular access including the acquisition of a permanent ultrasound image documenting patency of the accessed vessel. Under fluoroscopy, a Zoom 88 guide catheter was navigated over a 6 [place]  Berenstein 2 catheter and a 0.035" Terumo Glidewire into the aortic arch. The catheter was placed into the left common carotid artery and then advanced into the left internal carotid artery. The diagnostic catheter was removed. Left anterior oblique angiograms of the head was obtained. FINDINGS: 1. Normal caliber of the right common femoral artery, adequate vascular access. 2. Fetal left  PCA with occlusion of the proximal left P2 segment. 3. Patent left MCA and ACA vascular trees. PROCEDURE: Using biplane roadmap, a Zoom 55 aspiration catheter was navigated over an Aristotle 24 microguidewire into the cavernous segment of the right ICA. The aspiration catheter was then advanced to the level of occlusion and connected to an aspiration pump. Continuous aspiration was performed for 2 minutes. The guide catheter was connected to a VacLok syringe. The aspiration catheter was subsequently removed under constant aspiration. The guide catheter was aspirated for debris. Left internal carotid artery angiogram with left anterior oblique view of the head was obtained. Recanalization of the left PCA is noted with small, non flow limiting filling defect noted in the P2 segment. Delayed left internal carotid artery angiograms showed increase in size of left P2/PCA filling defect with persistent brisk anterograde flow. Using biplane roadmap, a Zoom 55 aspiration catheter was navigated over an Aristotle 24 microguidewire into the cavernous segment of the right ICA. The aspiration catheter was then advanced to the level of occlusion and connected to an aspiration pump. Continuous aspiration was performed for 2 minutes. The guide catheter was connected to a VacLok syringe. The aspiration catheter was subsequently removed under constant aspiration. The guide catheter was aspirated for debris. Left internal carotid artery angiogram with left anterior oblique view of the head was obtained. Decreased size of the filling defect noted.  However, delayed angiograms showed worsening of the filling defect with distal slow flow. Using biplane roadmap guidance, a zoom 55 aspiration catheter was navigated over a phenom 21 microcatheter and a Aristotle 18 microguidewire into the cavernous segment of the left ICA. The microcatheter was then navigated over the wire into the left P3/PCA segment. Then, a 3 x 20 mm solitaire stent retriever was deployed spanning the P2. The device was allowed to intercalated with the clot for 4 minutes. The microcatheter was removed. The aspiration catheter was advanced to the level of occlusion and connected to an aspiration pump. The thrombectomy device and aspiration catheter were removed under constant aspiration. Left internal carotid artery angiogram left anterior oblique view showed complete recanalization of the left PCA without residual filling defect. Flat panel CT of the head was obtained and post processed in a separate workstation with concurrent attending physician supervision. Selected images were sent to PACS. No evidence of hemorrhagic complication. The catheter was removed from the left internal carotid artery and then advanced into the left subclavian artery. Coaxial navigation of a 6 [place] Berenstein into the left vertebral artery was performed. Frontal angiogram of the head was obtained. Compartment patency of the left vertebral artery, basilar artery and right PCA. No opacification of the left PCA due to direct origin from the left internal carotid artery (fetal PCA). The catheter was subsequently withdrawn. Right common femoral artery angiogram was obtained in right anterior oblique view. The puncture is at the level of the common femoral artery. The artery has normal caliber, adequate for closure device. The sheath was exchanged over the wire for a Perclose ProGlide which was utilized for access closure. Immediate hemostasis was achieved. IMPRESSION: Successful mechanical thrombectomy for treatment of  a proximal occlusion of the P2 segment of the fetal left PCA. No evidence of thromboembolic or hemorrhagic complication. PLAN: Transfer to ICU for continued post stroke care. Electronically Signed   By: Pedro Earls M.D.   On: 04/02/2022 14:44   IR CT Head Ltd  Result Date: 04/02/2022 INDICATION: 66 year old male with past medical history significant for  arthritis (baseline modified Rankin scale 0) presenting with dizziness, right hemianopia, dysarthria and mild right-sided weakness. His last known well was 3 p.m. on 04/01/2022. Initially, NIHSS was 3 with improvement to NIHSS 1. However, within short interval patient declined to NIHSS 12. CT angiogram of the head and neck showed an occlusion of the proximal fetal left P2/PCA. Head CT showed no acute infarct or hemorrhage. The was brought to our service for emergency diagnostic cerebral angiogram and mechanical thrombectomy. EXAM: DIAGNOSTIC CEREBRAL ANGIOGRAM MECHANICAL THROMBECTOMY FLAT PANEL HEAD CT COMPARISON:  CT/CT angiogram of the head and neck April 01, 2022 MEDICATIONS: No antibiotics administered. ANESTHESIA/SEDATION: The procedure was performed in the general anesthesia. CONTRAST:  90 mL of Omnipaque 300 milligram/mL FLUOROSCOPY: Radiation Exposure Index (as provided by the fluoroscopic device): 299 mGy Kerma COMPLICATIONS: None immediate. TECHNIQUE: Informed written consent was obtained from the patient's wife after a thorough discussion of the procedural risks, benefits and alternatives. All questions were addressed. Maximal Sterile Barrier Technique was utilized including caps, mask, sterile gowns, sterile gloves, sterile drape, hand hygiene and skin antiseptic. A timeout was performed prior to the initiation of the procedure. The right groin was prepped and draped in the usual sterile fashion. Using a micropuncture kit and the modified Seldinger technique, access was gained to the right common femoral artery and an 8 French sheath  was placed. Real-time ultrasound guidance was utilized for vascular access including the acquisition of a permanent ultrasound image documenting patency of the accessed vessel. Under fluoroscopy, a Zoom 88 guide catheter was navigated over a 6 [place] Berenstein 2 catheter and a 0.035" Terumo Glidewire into the aortic arch. The catheter was placed into the left common carotid artery and then advanced into the left internal carotid artery. The diagnostic catheter was removed. Left anterior oblique angiograms of the head was obtained. FINDINGS: 1. Normal caliber of the right common femoral artery, adequate vascular access. 2. Fetal left PCA with occlusion of the proximal left P2 segment. 3. Patent left MCA and ACA vascular trees. PROCEDURE: Using biplane roadmap, a Zoom 55 aspiration catheter was navigated over an Aristotle 24 microguidewire into the cavernous segment of the right ICA. The aspiration catheter was then advanced to the level of occlusion and connected to an aspiration pump. Continuous aspiration was performed for 2 minutes. The guide catheter was connected to a VacLok syringe. The aspiration catheter was subsequently removed under constant aspiration. The guide catheter was aspirated for debris. Left internal carotid artery angiogram with left anterior oblique view of the head was obtained. Recanalization of the left PCA is noted with small, non flow limiting filling defect noted in the P2 segment. Delayed left internal carotid artery angiograms showed increase in size of left P2/PCA filling defect with persistent brisk anterograde flow. Using biplane roadmap, a Zoom 55 aspiration catheter was navigated over an Aristotle 24 microguidewire into the cavernous segment of the right ICA. The aspiration catheter was then advanced to the level of occlusion and connected to an aspiration pump. Continuous aspiration was performed for 2 minutes. The guide catheter was connected to a VacLok syringe. The aspiration  catheter was subsequently removed under constant aspiration. The guide catheter was aspirated for debris. Left internal carotid artery angiogram with left anterior oblique view of the head was obtained. Decreased size of the filling defect noted. However, delayed angiograms showed worsening of the filling defect with distal slow flow. Using biplane roadmap guidance, a zoom 55 aspiration catheter was navigated over a phenom 21 microcatheter and a  Aristotle 18 microguidewire into the cavernous segment of the left ICA. The microcatheter was then navigated over the wire into the left P3/PCA segment. Then, a 3 x 20 mm solitaire stent retriever was deployed spanning the P2. The device was allowed to intercalated with the clot for 4 minutes. The microcatheter was removed. The aspiration catheter was advanced to the level of occlusion and connected to an aspiration pump. The thrombectomy device and aspiration catheter were removed under constant aspiration. Left internal carotid artery angiogram left anterior oblique view showed complete recanalization of the left PCA without residual filling defect. Flat panel CT of the head was obtained and post processed in a separate workstation with concurrent attending physician supervision. Selected images were sent to PACS. No evidence of hemorrhagic complication. The catheter was removed from the left internal carotid artery and then advanced into the left subclavian artery. Coaxial navigation of a 6 [place] Berenstein into the left vertebral artery was performed. Frontal angiogram of the head was obtained. Compartment patency of the left vertebral artery, basilar artery and right PCA. No opacification of the left PCA due to direct origin from the left internal carotid artery (fetal PCA). The catheter was subsequently withdrawn. Right common femoral artery angiogram was obtained in right anterior oblique view. The puncture is at the level of the common femoral artery. The artery has  normal caliber, adequate for closure device. The sheath was exchanged over the wire for a Perclose ProGlide which was utilized for access closure. Immediate hemostasis was achieved. IMPRESSION: Successful mechanical thrombectomy for treatment of a proximal occlusion of the P2 segment of the fetal left PCA. No evidence of thromboembolic or hemorrhagic complication. PLAN: Transfer to ICU for continued post stroke care. Electronically Signed   By: Pedro Earls M.D.   On: 04/02/2022 14:44   ECHOCARDIOGRAM COMPLETE  Result Date: 04/02/2022    ECHOCARDIOGRAM REPORT   Patient Name:   Vincent Oconnell Date of Exam: 04/02/2022 Medical Rec #:  [id number]     Height:       68.0 in Accession #:    [id number]    Weight:       193.6 lb Date of Birth:  12-Sep-1956     BSA:          2.016 m Patient Age:    66 years      BP:           135/77 mmHg Patient Gender: M             HR:           96 bpm. Exam Location:  Inpatient Procedure: 2D Echo, Cardiac Doppler and Color Doppler Indications:    Stroke  History:        Patient has no prior history of Echocardiogram examinations.  Sonographer:    Jyl Heinz Referring Phys: [id number] [place]  1. Left ventricular ejection fraction, by estimation, is 60 to 65%. The left ventricle has normal function. The left ventricle has no regional wall motion abnormalities. Left ventricular diastolic parameters were normal.  2. Right ventricular systolic function is normal. The right ventricular size is normal. Tricuspid regurgitation signal is inadequate for assessing PA pressure.  3. The mitral valve is grossly normal. Trivial mitral valve regurgitation. No evidence of mitral stenosis.  4. The aortic valve is tricuspid. Aortic valve regurgitation is not visualized. No aortic stenosis is present.  5. The inferior vena cava is normal in size with greater than 50% respiratory  variability, suggesting right atrial pressure of 3 mmHg. Conclusion(s)/Recommendation(s): No  intracardiac source of embolism detected on this transthoracic study. Consider a transesophageal echocardiogram to exclude cardiac source of embolism if clinically indicated. FINDINGS  Left Ventricle: Left ventricular ejection fraction, by estimation, is 60 to 65%. The left ventricle has normal function. The left ventricle has no regional wall motion abnormalities. The left ventricular internal cavity size was normal in size. There is  no left ventricular hypertrophy. Left ventricular diastolic parameters were normal. Right Ventricle: The right ventricular size is normal. No increase in right ventricular wall thickness. Right ventricular systolic function is normal. Tricuspid regurgitation signal is inadequate for assessing PA pressure. Left Atrium: Left atrial size was normal in size. Right Atrium: Right atrial size was normal in size. Pericardium: Trivial pericardial effusion is present. Mitral Valve: The mitral valve is grossly normal. Trivial mitral valve regurgitation. No evidence of mitral valve stenosis. Tricuspid Valve: The tricuspid valve is grossly normal. Tricuspid valve regurgitation is trivial. No evidence of tricuspid stenosis. Aortic Valve: The aortic valve is tricuspid. Aortic valve regurgitation is not visualized. No aortic stenosis is present. Aortic valve peak gradient measures 9.4 mmHg. Pulmonic Valve: The pulmonic valve was grossly normal. Pulmonic valve regurgitation is not visualized. No evidence of pulmonic stenosis. Aorta: The aortic root and ascending aorta are structurally normal, with no evidence of dilitation. Venous: The inferior vena cava is normal in size with greater than 50% respiratory variability, suggesting right atrial pressure of 3 mmHg. IAS/Shunts: The atrial septum is grossly normal.  LEFT VENTRICLE PLAX 2D LVIDd:         4.40 cm      Diastology LVIDs:         2.90 cm      LV e' medial:    8.08 cm/s LV PW:         1.00 cm      LV E/e' medial:  8.7 LV IVS:        1.00 cm       LV e' lateral:   9.32 cm/s LVOT diam:     2.10 cm      LV E/e' lateral: 7.5 LV SV:         87 LV SV Index:   43 LVOT Area:     3.46 cm  LV Volumes (MOD) LV vol d, MOD A2C: 114.0 ml LV vol d, MOD A4C: 121.0 ml LV vol s, MOD A2C: 42.6 ml LV vol s, MOD A4C: 44.9 ml LV SV MOD A2C:     71.4 ml LV SV MOD A4C:     121.0 ml LV SV MOD BP:      75.5 ml RIGHT VENTRICLE             IVC RV Basal diam:  2.40 cm     IVC diam: 1.30 cm RV Mid diam:    2.10 cm RV S prime:     17.00 cm/s TAPSE (M-mode): 2.2 cm LEFT ATRIUM             Index        RIGHT ATRIUM          Index LA diam:        3.00 cm 1.49 cm/m   RA Area:     9.14 cm LA Vol (A2C):   28.8 ml 14.29 ml/m  RA Volume:   13.40 ml 6.65 ml/m LA Vol (A4C):   26.5 ml 13.15 ml/m LA Biplane Vol: 27.4 ml 13.59  ml/m  AORTIC VALVE AV Area (Vmax): 3.46 cm AV Vmax:        153.00 cm/s AV Peak Grad:   9.4 mmHg LVOT Vmax:      153.00 cm/s LVOT Vmean:     96.600 cm/s LVOT VTI:       0.250 m  AORTA Ao Root diam: 3.80 cm Ao Asc diam:  3.60 cm MITRAL VALVE MV Area (PHT): 5.70 cm    SHUNTS MV Decel Time: 133 msec    Systemic VTI:  0.25 m MV E velocity: 70.30 cm/s  Systemic Diam: 2.10 cm MV A velocity: 84.40 cm/s MV E/A ratio:  0.83 Eleonore Chiquito MD Electronically signed by Eleonore Chiquito MD Signature Date/Time: 04/02/2022/2:36:26 PM    Final    CT ANGIO HEAD NECK W WO CM  Result Date: 04/01/2022 CLINICAL DATA:  Code stroke EXAM: CT ANGIOGRAPHY HEAD AND NECK CT PERFUSION BRAIN TECHNIQUE: Multidetector CT imaging of the head and neck was performed using the standard protocol during bolus administration of intravenous contrast. Multiplanar CT image reconstructions and MIPs were obtained to evaluate the vascular anatomy. Carotid stenosis measurements (when applicable) are obtained utilizing NASCET criteria, using the distal internal carotid diameter as the denominator. Multiphase CT imaging of the brain was performed following IV bolus contrast injection. Subsequent parametric perfusion  maps were calculated using RAPID software. RADIATION DOSE REDUCTION: This exam was performed according to the departmental dose-optimization program which includes automated exposure control, adjustment of the mA and/or kV according to patient size and/or use of iterative reconstruction technique. CONTRAST:  168m OMNIPAQUE IOHEXOL 350 MG/ML SOLN COMPARISON:  None Available. FINDINGS: CTA NECK FINDINGS SKELETON: There is no bony spinal canal stenosis. No lytic or blastic lesion. OTHER NECK: Normal pharynx, larynx and major salivary glands. No cervical lymphadenopathy. Unremarkable thyroid gland. UPPER CHEST: No pneumothorax or pleural effusion. No nodules or masses. AORTIC ARCH: There is no calcific atherosclerosis of the aortic arch. There is no aneurysm, dissection or hemodynamically significant stenosis of the visualized portion of the aorta. Conventional 3 vessel aortic branching pattern. The visualized proximal subclavian arteries are widely patent. RIGHT CAROTID SYSTEM: No dissection, occlusion or aneurysm. Mild atherosclerotic calcification at the carotid bifurcation without hemodynamically significant stenosis. LEFT CAROTID SYSTEM: No dissection, occlusion or aneurysm. Mild atherosclerotic calcification at the carotid bifurcation without hemodynamically significant stenosis. VERTEBRAL ARTERIES: Codominant configuration. Both origins are clearly patent. There is no dissection, occlusion or flow-limiting stenosis to the skull base (V1-V3 segments). CTA HEAD FINDINGS POSTERIOR CIRCULATION: --Vertebral arteries: Normal V4 segments. --Inferior cerebellar arteries: Normal. --Basilar artery: Normal. --Superior cerebellar arteries: Normal. --Posterior cerebral arteries (PCA): Left PCA P2 segment remains occluded. There are fetal predominant origins of both posterior cerebral arteries. ANTERIOR CIRCULATION: --Intracranial internal carotid arteries: Normal. --Anterior cerebral arteries (ACA): Normal. Both A1 segments  are present. Patent anterior communicating artery (a-comm). --Middle cerebral arteries (MCA): Normal. VENOUS SINUSES: As permitted by contrast timing, patent. ANATOMIC VARIANTS: None Review of the MIP images confirms the above findings. CT Brain Perfusion Findings: ASPECTS: 10 at 5:02 p.m. on 04/01/2022 CBF (<30%) Volume: 063mPerfusion (Tmax>6.0s) volume: 3430mismatch Volume: 20m16mfarction Location:No infarction. The area of ischemia is located in the left PCA territory. IMPRESSION: 1. Occlusion of the left PCA P2 segment, with 34 mL area of ischemic penumbra within the left PCA territory. No infarct by CT perfusion analysis. 2. Bilateral carotid bifurcation atherosclerosis without hemodynamically significant stenosis by NASCET criteria. Electronically Signed   By: KeviUlyses Jarred.   On: 04/01/2022  22:36   CT CEREBRAL PERFUSION W CONTRAST  Result Date: 04/01/2022 CLINICAL DATA:  Code stroke EXAM: CT ANGIOGRAPHY HEAD AND NECK CT PERFUSION BRAIN TECHNIQUE: Multidetector CT imaging of the head and neck was performed using the standard protocol during bolus administration of intravenous contrast. Multiplanar CT image reconstructions and MIPs were obtained to evaluate the vascular anatomy. Carotid stenosis measurements (when applicable) are obtained utilizing NASCET criteria, using the distal internal carotid diameter as the denominator. Multiphase CT imaging of the brain was performed following IV bolus contrast injection. Subsequent parametric perfusion maps were calculated using RAPID software. RADIATION DOSE REDUCTION: This exam was performed according to the departmental dose-optimization program which includes automated exposure control, adjustment of the mA and/or kV according to patient size and/or use of iterative reconstruction technique. CONTRAST:  165mL OMNIPAQUE IOHEXOL 350 MG/ML SOLN COMPARISON:  None Available. FINDINGS: CTA NECK FINDINGS SKELETON: There is no bony spinal canal stenosis. No lytic  or blastic lesion. OTHER NECK: Normal pharynx, larynx and major salivary glands. No cervical lymphadenopathy. Unremarkable thyroid gland. UPPER CHEST: No pneumothorax or pleural effusion. No nodules or masses. AORTIC ARCH: There is no calcific atherosclerosis of the aortic arch. There is no aneurysm, dissection or hemodynamically significant stenosis of the visualized portion of the aorta. Conventional 3 vessel aortic branching pattern. The visualized proximal subclavian arteries are widely patent. RIGHT CAROTID SYSTEM: No dissection, occlusion or aneurysm. Mild atherosclerotic calcification at the carotid bifurcation without hemodynamically significant stenosis. LEFT CAROTID SYSTEM: No dissection, occlusion or aneurysm. Mild atherosclerotic calcification at the carotid bifurcation without hemodynamically significant stenosis. VERTEBRAL ARTERIES: Codominant configuration. Both origins are clearly patent. There is no dissection, occlusion or flow-limiting stenosis to the skull base (V1-V3 segments). CTA HEAD FINDINGS POSTERIOR CIRCULATION: --Vertebral arteries: Normal V4 segments. --Inferior cerebellar arteries: Normal. --Basilar artery: Normal. --Superior cerebellar arteries: Normal. --Posterior cerebral arteries (PCA): Left PCA P2 segment remains occluded. There are fetal predominant origins of both posterior cerebral arteries. ANTERIOR CIRCULATION: --Intracranial internal carotid arteries: Normal. --Anterior cerebral arteries (ACA): Normal. Both A1 segments are present. Patent anterior communicating artery (a-comm). --Middle cerebral arteries (MCA): Normal. VENOUS SINUSES: As permitted by contrast timing, patent. ANATOMIC VARIANTS: None Review of the MIP images confirms the above findings. CT Brain Perfusion Findings: ASPECTS: 10 at 5:02 p.m. on 04/01/2022 CBF (<30%) Volume: 84mL Perfusion (Tmax>6.0s) volume: 1mL Mismatch Volume: 30mL Infarction Location:No infarction. The area of ischemia is located in the left  PCA territory. IMPRESSION: 1. Occlusion of the left PCA P2 segment, with 34 mL area of ischemic penumbra within the left PCA territory. No infarct by CT perfusion analysis. 2. Bilateral carotid bifurcation atherosclerosis without hemodynamically significant stenosis by NASCET criteria. Electronically Signed   By: Ulyses Jarred M.D.   On: 04/01/2022 22:36   CT HEAD WO CONTRAST (5MM)  Result Date: 04/01/2022 CLINICAL DATA:  Acute neurologic deficit EXAM: CT HEAD WITHOUT CONTRAST TECHNIQUE: Contiguous axial images were obtained from the base of the skull through the vertex without intravenous contrast. RADIATION DOSE REDUCTION: This exam was performed according to the departmental dose-optimization program which includes automated exposure control, adjustment of the mA and/or kV according to patient size and/or use of iterative reconstruction technique. COMPARISON:  None Available. FINDINGS: Brain: There is no mass, hemorrhage or extra-axial collection. The size and configuration of the ventricles and extra-axial CSF spaces are normal. The brain parenchyma is normal, without acute or chronic infarction. Vascular: No abnormal hyperdensity of the major intracranial arteries or dural venous sinuses. No intracranial atherosclerosis. Skull: The  visualized skull base, calvarium and extracranial soft tissues are normal. Sinuses/Orbits: No fluid levels or advanced mucosal thickening of the visualized paranasal sinuses. No mastoid or middle ear effusion. The orbits are normal. IMPRESSION: Normal head CT. Electronically Signed   By: Ulyses Jarred M.D.   On: 04/01/2022 21:06   CT ANGIO HEAD NECK W WO CM  Result Date: 04/01/2022 CLINICAL DATA:  Code stroke follow-up EXAM: CT ANGIOGRAPHY HEAD AND NECK TECHNIQUE: Multidetector CT imaging of the head and neck was performed using the standard protocol during bolus administration of intravenous contrast. Multiplanar CT image reconstructions and MIPs were obtained to evaluate  the vascular anatomy. Carotid stenosis measurements (when applicable) are obtained utilizing NASCET criteria, using the distal internal carotid diameter as the denominator. RADIATION DOSE REDUCTION: This exam was performed according to the departmental dose-optimization program which includes automated exposure control, adjustment of the mA and/or kV according to patient size and/or use of iterative reconstruction technique. CONTRAST:  63m OMNIPAQUE IOHEXOL 350 MG/ML SOLN COMPARISON:  Same day CTA head/neck FINDINGS: CTA NECK FINDINGS Aortic arch: The imaged aortic arch is unremarkable. The origins of the major branch vessels are patent. The subclavian arteries are patent to the level imaged. Right carotid system: The right common, internal, and external carotid arteries are patent with minimal plaque at the bifurcation but no hemodynamically significant stenosis or occlusion. There is no dissection or aneurysm. Left carotid system: The left common, internal, and external carotid arteries are patent with minimal plaque at the bifurcation but no hemodynamically significant stenosis or occlusion. There is no dissection or aneurysm. Vertebral arteries: Vertebral arteries are patent, without hemodynamically significant stenosis or occlusion. There is no dissection or aneurysm. Skeleton: There is mild multilevel degenerative change of the cervical spine. There is no acute osseous abnormality or suspicious osseous lesion. There is no visible canal hematoma. Other neck: Soft tissues of the neck are unremarkable. Upper chest: The imaged lung apices are clear. Review of the MIP images confirms the above findings CTA HEAD FINDINGS Anterior circulation: There is mild calcified plaque in the left intracranial ICA without hemodynamically significant stenosis or occlusion. The bilateral MCAs are patent without proximal high-grade stenosis or occlusion. Specifically, the previously suspected left M2 occlusion/stenosis is not seen  and was likely artifactual. The bilateral ACAs are patent. The anterior communicating artery is normal. There is no aneurysm or AVM. Posterior circulation: The bilateral V4 segments are patent. PICA is identified bilaterally. The basilar artery is patent There is a fetal origin of the left PCA. There is short-segment critical stenosis/occlusion of the left P2 segment (7-120) with distal reconstitution. There is irregularity of the PCA distal to this point without other evidence of occlusion. The right PCA is patent. The right posterior communicating artery is also identified. There is no aneurysm or AVM. Venous sinuses: Patent. Anatomic variants: As above. Review of the MIP images confirms the above findings IMPRESSION: 1. Short-segment critical stenosis/occlusion of the left PCA with reconstitution. No other definite occlusion is seen in the left PCA. 2. Otherwise patent intracranial vasculature. The previously suspected left M2 stenosis/occlusion is not seen on the current study and was likely artifactual. 3. Patent vasculature of the neck with no hemodynamically significant stenosis or occlusion. Electronically Signed   By: PValetta MoleM.D.   On: 04/01/2022 17:20   CT HEAD WO CONTRAST (5MM)  Result Date: 04/01/2022 CLINICAL DATA:  Stroke suspected, worsening symptoms after TNK EXAM: CT HEAD WITHOUT CONTRAST TECHNIQUE: Contiguous axial images were obtained from the  base of the skull through the vertex without intravenous contrast. RADIATION DOSE REDUCTION: This exam was performed according to the departmental dose-optimization program which includes automated exposure control, adjustment of the mA and/or kV according to patient size and/or use of iterative reconstruction technique. COMPARISON:  04/01/2022 FINDINGS: Brain: No evidence of acute infarction, hemorrhage, cerebral edema, mass, mass effect, or midline shift. No hydrocephalus or extra-axial fluid collection. Vascular: No hyperdense vessel. Contrast  is noted in the vasculature from recent CTA. Skull: Normal. Negative for fracture or focal lesion. Sinuses/Orbits: No acute finding. Other: The mastoid air cells are well aerated. IMPRESSION: No acute intracranial process. Electronically Signed   By: Merilyn Baba M.D.   On: 04/01/2022 17:07   CT HEAD CODE STROKE WO CONTRAST  Result Date: 04/01/2022 CLINICAL DATA:  Right eye vision loss and slurred speech EXAM: CT ANGIOGRAPHY HEAD AND NECK TECHNIQUE: Multidetector CT imaging of the head and neck was performed using the standard protocol during bolus administration of intravenous contrast. Multiplanar CT image reconstructions and MIPs were obtained to evaluate the vascular anatomy. Carotid stenosis measurements (when applicable) are obtained utilizing NASCET criteria, using the distal internal carotid diameter as the denominator. RADIATION DOSE REDUCTION: This exam was performed according to the departmental dose-optimization program which includes automated exposure control, adjustment of the mA and/or kV according to patient size and/or use of iterative reconstruction technique. CONTRAST:  176m OMNIPAQUE IOHEXOL 350 MG/ML SOLN COMPARISON:  None Available. FINDINGS: CT HEAD FINDINGS Brain: There is no acute intracranial hemorrhage, extra-axial fluid collection, or acute infarct. Parenchymal volume is normal. The ventricles are normal in size. Gray-white differentiation is preserved. There is no mass lesion. There is no mass effect or midline shift. Vascular: No dense vessel is seen. Skull: Normal. Negative for fracture or focal lesion. Sinuses/Orbits: Imaged paranasal sinuses are clear. The globes and orbits are unremarkable. Other: None. ASPECTS (AEdmundStroke Program Early CT Score) - Ganglionic level infarction (caudate, lentiform nuclei, internal capsule, insula, M1-M3 cortex): 7 - Supraganglionic infarction (M4-M6 cortex): 3 Total score (0-10 with 10 being normal): 10 CTA NECK FINDINGS The CTA images are  markedly degraded by poor bolus timing. Aortic arch: The imaged aortic arch is grossly unremarkable. The origins of the major branch vessels appear patent. The subclavian arteries appear patent to the level imaged. Right carotid system: There is minimal plaque at the carotid bifurcation. There is no definite hemodynamically significant stenosis or occlusion. There is no definite dissection or aneurysm. Left carotid system: There is scattered mild soft plaque in the left common carotid artery without hemodynamically significant stenosis. There is minimal plaque at the bifurcation without definite hemodynamically significant stenosis or occlusion. There is no definite dissection or aneurysm. Vertebral arteries: The V1 and proximal V2 segments of the vertebral arteries are suboptimally evaluated. The distal V2 and V3 segments appear patent without definite hemodynamically significant stenosis or occlusion. There is no definite dissection or aneurysm. Skeleton: Other neck: Upper chest: Review of the MIP images confirms the above findings CTA HEAD FINDINGS Anterior circulation: There is no definite hemodynamically significant stenosis or occlusion in the intracranial ICAs Bilateral M1 segments appear grossly patent. There is possible high-grade stenosis or occlusion of a left M2 segment in the sylvian fissure (18-107, 16-14). There is no definite proximal high-grade stenosis or occlusion on the right, though evaluation is markedly suboptimal due to bolus timing. There is no definite proximal high-grade stenosis or occlusion in the anterior cerebral arteries within the above confines. There is no definite aneurysm.  Posterior circulation: The bilateral V4 segments and basilar artery are grossly patent without definite proximal high-grade stenosis or occlusion. There is short-segment high-grade stenosis/occlusion of the left P2 segment (5-47, 18-114). There is apparent reconstitution, though there is a suspected additional  occlusion distally (18-133). Bilateral posterior communicating arteries are identified with a fetal origin on the left. There is no definite proximal high-grade stenosis or occlusion on the right. There is no definite aneurysm. Venous sinuses: Patent. Anatomic variants: As above. Review of the MIP images confirms the above findings IMPRESSION: 1. No acute intracranial hemorrhage or infarct.  ASPECTS is 10. 2. The CTA image quality is markedly degraded by poor bolus timing with significantly suboptimal evaluation of the distal intracranial vasculature. 3. Short-segment occlusion of the left P2 segment. There is apparent reconstitution, but there is a suspected additional occlusion distally. 4. Suspected high-grade stenosis or occlusion of a left M2 segment in the sylvian fissure. 5. No other definite proximal high-grade stenosis or occlusion in the intracranial vasculature. 6. No definite hemodynamically significant stenosis or occlusion in the vasculature of the neck within the above confines. The results of the initial noncontrast head CT were called by telephone at the time of interpretation on 04/01/2022 at 4:12 pm to provider Luciano Cutter NP, who verbally acknowledged these results. The CTA findings were discussed with Dr. Curly Shores at 4:45pm. Electronically Signed   By: Valetta Mole M.D.   On: 04/01/2022 17:02   CT ANGIO HEAD NECK W WO CM (CODE STROKE)  Result Date: 04/01/2022 CLINICAL DATA:  Right eye vision loss and slurred speech EXAM: CT ANGIOGRAPHY HEAD AND NECK TECHNIQUE: Multidetector CT imaging of the head and neck was performed using the standard protocol during bolus administration of intravenous contrast. Multiplanar CT image reconstructions and MIPs were obtained to evaluate the vascular anatomy. Carotid stenosis measurements (when applicable) are obtained utilizing NASCET criteria, using the distal internal carotid diameter as the denominator. RADIATION DOSE REDUCTION: This exam was performed  according to the departmental dose-optimization program which includes automated exposure control, adjustment of the mA and/or kV according to patient size and/or use of iterative reconstruction technique. CONTRAST:  134m OMNIPAQUE IOHEXOL 350 MG/ML SOLN COMPARISON:  None Available. FINDINGS: CT HEAD FINDINGS Brain: There is no acute intracranial hemorrhage, extra-axial fluid collection, or acute infarct. Parenchymal volume is normal. The ventricles are normal in size. Gray-white differentiation is preserved. There is no mass lesion. There is no mass effect or midline shift. Vascular: No dense vessel is seen. Skull: Normal. Negative for fracture or focal lesion. Sinuses/Orbits: Imaged paranasal sinuses are clear. The globes and orbits are unremarkable. Other: None. ASPECTS ([place] Early CT Score) - Ganglionic level infarction (caudate, lentiform nuclei, internal capsule, insula, M1-M3 cortex): 7 - Supraganglionic infarction (M4-M6 cortex): 3 Total score (0-10 with 10 being normal): 10 CTA NECK FINDINGS The CTA images are markedly degraded by poor bolus timing. Aortic arch: The imaged aortic arch is grossly unremarkable. The origins of the major branch vessels appear patent. The subclavian arteries appear patent to the level imaged. Right carotid system: There is minimal plaque at the carotid bifurcation. There is no definite hemodynamically significant stenosis or occlusion. There is no definite dissection or aneurysm. Left carotid system: There is scattered mild soft plaque in the left common carotid artery without hemodynamically significant stenosis. There is minimal plaque at the bifurcation without definite hemodynamically significant stenosis or occlusion. There is no definite dissection or aneurysm. Vertebral arteries: The V1 and proximal V2 segments of the  vertebral arteries are suboptimally evaluated. The distal V2 and V3 segments appear patent without definite hemodynamically significant  stenosis or occlusion. There is no definite dissection or aneurysm. Skeleton: Other neck: Upper chest: Review of the MIP images confirms the above findings CTA HEAD FINDINGS Anterior circulation: There is no definite hemodynamically significant stenosis or occlusion in the intracranial ICAs Bilateral M1 segments appear grossly patent. There is possible high-grade stenosis or occlusion of a left M2 segment in the sylvian fissure (18-107, 16-14). There is no definite proximal high-grade stenosis or occlusion on the right, though evaluation is markedly suboptimal due to bolus timing. There is no definite proximal high-grade stenosis or occlusion in the anterior cerebral arteries within the above confines. There is no definite aneurysm. Posterior circulation: The bilateral V4 segments and basilar artery are grossly patent without definite proximal high-grade stenosis or occlusion. There is short-segment high-grade stenosis/occlusion of the left P2 segment (5-47, 18-114). There is apparent reconstitution, though there is a suspected additional occlusion distally (18-133). Bilateral posterior communicating arteries are identified with a fetal origin on the left. There is no definite proximal high-grade stenosis or occlusion on the right. There is no definite aneurysm. Venous sinuses: Patent. Anatomic variants: As above. Review of the MIP images confirms the above findings IMPRESSION: 1. No acute intracranial hemorrhage or infarct.  ASPECTS is 10. 2. The CTA image quality is markedly degraded by poor bolus timing with significantly suboptimal evaluation of the distal intracranial vasculature. 3. Short-segment occlusion of the left P2 segment. There is apparent reconstitution, but there is a suspected additional occlusion distally. 4. Suspected high-grade stenosis or occlusion of a left M2 segment in the sylvian fissure. 5. No other definite proximal high-grade stenosis or occlusion in the intracranial vasculature. 6. No  definite hemodynamically significant stenosis or occlusion in the vasculature of the neck within the above confines. The results of the initial noncontrast head CT were called by telephone at the time of interpretation on 04/01/2022 at 4:12 pm to provider Luciano Cutter NP, who verbally acknowledged these results. The CTA findings were discussed with Dr. Curly Shores at 4:45pm. Electronically Signed   By: Valetta Mole M.D.   On: 04/01/2022 17:02    Labs:  CBC: Recent Labs    04/01/22 1600 04/01/22 1608  WBC 5.8  --   HGB 14.8 15.6  HCT 44.5 46.0  PLT 188  --     COAGS: Recent Labs    04/01/22 1600  INR 1.0  APTT 25    BMP: Recent Labs    04/01/22 1600 04/01/22 1608  NA 142 141  K 3.8 3.7  CL 104 103  CO2 25  --   GLUCOSE 123* 122*  BUN 12 13  CALCIUM 9.5  --   CREATININE 1.29* 1.30*  GFRNONAA >60  --     LIVER FUNCTION TESTS: Recent Labs    04/01/22 1600  BILITOT 0.9  AST 21  ALT 19  ALKPHOS 52  PROT 7.1  ALBUMIN 4.2    Assessment and Plan:  S/p (L) PCA thrombectomy with subsequent reocclusion of the proximal left P2/PCA. Taken back to [facility] for repeat intervention with one direct contact aspiration pass performed with minimal recanalization (TICI 1). Cangrelor load dose administered and a 4 x 24 mm neuroform atlas stent was deployed across the occlusion with complete recanalization. No thromboembolic or hemorrhagic complication.  No new complaints this morning. Patient will follow up with [facility] in approximately 6 months. A scheduler from our office will call the  patient with a date/time of his appointment. Continue aspirin and brilinta.   Please call [facility] with any questions.  Electronically Signed: Soyla Dryer, AGACNP-BC [phone] 04/03/2022, 2:13 PM   I spent a total of 15 Minutes at the the patient's bedside AND on the patient's hospital floor or unit, greater than 50% of which was counseling/coordinating care for left PCA CVA

## 2022-04-04 ENCOUNTER — Other Ambulatory Visit (HOSPITAL_COMMUNITY): Payer: Self-pay

## 2022-04-04 ENCOUNTER — Encounter (HOSPITAL_COMMUNITY)
Admission: EM | Disposition: A | Discharge status: Home / Self Care | Payer: Self-pay | Source: Home / Self Care | Attending: Neurology

## 2022-04-04 DIAGNOSIS — I63532 Cerebral infarction due to unspecified occlusion or stenosis of left posterior cerebral artery: Secondary | ICD-10-CM | POA: Diagnosis not present

## 2022-04-04 DIAGNOSIS — I10 Essential (primary) hypertension: Secondary | ICD-10-CM

## 2022-04-04 DIAGNOSIS — E785 Hyperlipidemia, unspecified: Secondary | ICD-10-CM

## 2022-04-04 HISTORY — PX: LOOP RECORDER INSERTION: EP1214

## 2022-04-04 LAB — CARDIOLIPIN ANTIBODIES, IGG, IGM, IGA
Anticardiolipin IgA: <9 APL U/mL (ref 0–11)
Anticardiolipin IgG: <9 GPL U/mL (ref 0–14)
Anticardiolipin IgM: <9 MPL U/mL (ref 0–12)

## 2022-04-04 LAB — BETA-2-GLYCOPROTEIN I ABS, IGG/M/A
Beta-2 Glyco I IgG: <9 GPI IgG units (ref 0–20)
Beta-2-Glycoprotein I IgA: <9 GPI IgA units (ref 0–25)
Beta-2-Glycoprotein I IgM: <9 GPI IgM units (ref 0–32)

## 2022-04-04 SURGERY — LOOP RECORDER INSERTION

## 2022-04-04 MED ORDER — TICAGRELOR 90 MG PO TABS
90.0000 mg | ORAL_TABLET | Freq: Two times a day (BID) | ORAL | 0 refills | Status: DC
  Filled 2022-04-04: qty 60, 30d supply, fill #0

## 2022-04-04 MED ORDER — ATORVASTATIN CALCIUM 80 MG PO TABS
80.0000 mg | ORAL_TABLET | Freq: Every day | ORAL | 0 refills | Status: AC
  Filled 2022-04-04: qty 30, 30d supply, fill #0

## 2022-04-04 MED ORDER — LIDOCAINE-EPINEPHRINE 1 %-1:100000 IJ SOLN
INTRAMUSCULAR | Status: DC | PRN
  Administered 2022-04-04: 30 mL

## 2022-04-04 MED ORDER — ASPIRIN 81 MG PO CHEW: 81.0000 mg | CHEWABLE_TABLET | Freq: Every day | ORAL | 12 refills | Status: AC

## 2022-04-04 MED ORDER — LIDOCAINE-EPINEPHRINE 1 %-1:100000 IJ SOLN
INTRAMUSCULAR | Status: AC
  Filled 2022-04-04: qty 1

## 2022-04-04 SURGICAL SUPPLY — 2 items
MONITOR CARDIAC ASSERT IQ EL (Prosthesis & Implant Heart) ×1 IMPLANT
PACK LOOP INSERTION (CUSTOM PROCEDURE TRAY) ×2 IMPLANT

## 2022-04-04 NOTE — TOC Transition Note (Signed)
Transition of Care [facility]) - CM/SW Discharge Note   Patient Details  Name: Vincent Oconnell MRN: [id number] Date of Birth: 18-Jan-1956  [facility] CM/SW Contact:  Glennon Mac, RN Phone Number: 04/04/2022, 2:16 PM   Clinical Narrative:    66 y/o male presented to ED on 04/01/22 for R sided vision loss and dizziness. TNK given on 7/17 after CT head was negative with symptoms worsening. CTA showed L PCA occlusion. S/p mechanical thrombectomy with stent placement on 7/18. MRI showed acute L PCA infarcts. Prior to admission, patient independent and living at home with wife, who can provide 24-hour/day assistance.  PT/OT/ST recommending outpatient therapy, and patient agreeable to referrals.  Referral made to [facility] for follow-up.   Final next level of care: OP Rehab Barriers to Discharge: Barriers Resolved            Discharge Plan and Services   Discharge Planning Services: CM Consult                                 Social Determinants of Health (SDOH) Interventions     Readmission Risk Interventions     No data to display         Quintella Baton, RN, BSN  Trauma/Neuro ICU Case Manager [phone]

## 2022-04-04 NOTE — Discharge Instructions (Signed)
Post procedure wound care instructions (heart monitor) Keep incision clean and dry for 3 days. You can remove outer dressing tomorrow. Leave steri-strips (little pieces of tape) on until seen in the office for wound check appointment. Call the office ([phone]) for redness, drainage, swelling, or fever.  

## 2022-04-04 NOTE — Discharge Summary (Addendum)
Stroke Discharge Summary  Patient ID: Vincent Oconnell   MRN: [id number]      DOB: September 01, 1956  Date of Admission: 04/01/2022 Date of Discharge: 04/04/2022  Attending Physician:  Stroke, Md, MD, Stroke MD Consultant(s):    cardiology  Patient's PCP:  Patient, No Pcp Per- referral sent   DISCHARGE DIAGNOSIS:  Principal Problem:   Acute ischemic left PCA stroke ([place]) of due to left P 2 occlusion of cryptogenic etiology  s/p mechanical thrombectomy x 2 needing rescue left PCA stent Active Problems:   Essential hypertension   Hyperlipidemia Right homonymous hemianopia Right sided numbness   Allergies as of 04/04/2022       Reactions   Other Other (See Comments)   NO NARCOTICS - per pt's wife        Medication List     STOP taking these medications    meloxicam 15 MG tablet Commonly known as: MOBIC       TAKE these medications    aspirin 81 MG chewable tablet Chew 1 tablet (81 mg total) by mouth daily. Start taking on: April 05, 2022   atorvastatin 80 MG tablet Commonly known as: LIPITOR Take 1 tablet (80 mg total) by mouth daily. Start taking on: April 05, 2022   Brilinta 90 MG Tabs tablet Generic drug: ticagrelor Take 1 tablet (90 mg total) by mouth 2 (two) times daily.   Multivitamin Men 50+ Tabs Take 1 tablet by mouth daily.        LABORATORY STUDIES CBC    Component Value Date/Time   WBC 5.8 04/01/2022 1600   RBC 4.91 04/01/2022 1600   HGB 15.6 04/01/2022 1608   HCT 46.0 04/01/2022 1608   PLT 188 04/01/2022 1600   MCV 90.6 04/01/2022 1600   MCH 30.1 04/01/2022 1600   MCHC 33.3 04/01/2022 1600   RDW 11.9 04/01/2022 1600   LYMPHSABS 3.1 04/01/2022 1600   MONOABS 0.5 04/01/2022 1600   EOSABS 0.1 04/01/2022 1600   BASOSABS 0.0 04/01/2022 1600   CMP    Component Value Date/Time   NA 141 04/01/2022 1608   K 3.7 04/01/2022 1608   CL 103 04/01/2022 1608   CO2 25 04/01/2022 1600   GLUCOSE 122 (H) 04/01/2022 1608   BUN 13 04/01/2022 1608    CREATININE 1.30 (H) 04/01/2022 1608   CALCIUM 9.5 04/01/2022 1600   PROT 7.1 04/01/2022 1600   ALBUMIN 4.2 04/01/2022 1600   AST 21 04/01/2022 1600   ALT 19 04/01/2022 1600   ALKPHOS 52 04/01/2022 1600   BILITOT 0.9 04/01/2022 1600   GFRNONAA >60 04/01/2022 1600   GFRAA >60 09/15/2019 2141   COAGS Lab Results  Component Value Date   INR 1.0 04/01/2022   Lipid Panel    Component Value Date/Time   CHOL 251 (H) 04/02/2022 0514   TRIG 292 (H) 04/02/2022 0514   HDL 65 04/02/2022 0514   CHOLHDL 3.9 04/02/2022 0514   VLDL 58 (H) 04/02/2022 0514   LDLCALC 128 (H) 04/02/2022 0514   HgbA1C  Lab Results  Component Value Date   HGBA1C 5.9 (H) 04/01/2022   Urinalysis    Component Value Date/Time   COLORURINE STRAW (A) 04/02/2022 0526   APPEARANCEUR CLEAR 04/02/2022 0526   LABSPEC 1.044 (H) 04/02/2022 0526   PHURINE 5.0 04/02/2022 0526   GLUCOSEU NEGATIVE 04/02/2022 0526   HGBUR SMALL (A) 04/02/2022 0526   BILIRUBINUR NEGATIVE 04/02/2022 0526   KETONESUR NEGATIVE 04/02/2022 0526   PROTEINUR NEGATIVE 04/02/2022  0526   NITRITE NEGATIVE 04/02/2022 0526   LEUKOCYTESUR NEGATIVE 04/02/2022 0526   Urine Drug Screen     Component Value Date/Time   LABOPIA NONE DETECTED 04/02/2022 0526   COCAINSCRNUR NONE DETECTED 04/02/2022 0526   LABBENZ NONE DETECTED 04/02/2022 0526   AMPHETMU NONE DETECTED 04/02/2022 0526   THCU NONE DETECTED 04/02/2022 0526   LABBARB NONE DETECTED 04/02/2022 0526    Alcohol Level    Component Value Date/Time   ETH <10 04/01/2022 1600     SIGNIFICANT DIAGNOSTIC STUDIES MR BRAIN WO CONTRAST  Result Date: 04/02/2022 CLINICAL DATA:  Stroke, follow up 24 hour post TNK stability scan (APPROX.4:30 PM on 7/18) EXAM: MRI HEAD WITHOUT CONTRAST TECHNIQUE: Multiplanar, multiecho pulse sequences of the brain and surrounding structures were obtained without intravenous contrast. COMPARISON:  CT head 04/01/2022. FINDINGS: Brain: Acute left PCA territory infarcts  involving the left thalamus, hippocampus, medial left temporal lobe and occipital lobe. Associated edema without significant mass effect. No midline shift. Associated petechial hemorrhage without mass occupying acute hemorrhage. No hydrocephalus, mass lesion, or extra-axial fluid collection. Vascular: Major arterial flow voids are maintained at the skull base. Skull and upper cervical spine: Normal marrow signal. Sinuses/Orbits: Clear sinuses.  No acute orbital findings. Other: No mastoid effusions. IMPRESSION: Acute left PCA territory infarcts, described above. Associated edema and petechial hemorrhage without mass occupying acute hemorrhage or significant mass effect. Electronically Signed   By: Margaretha Sheffield M.D.   On: 04/02/2022 16:00   IR PERCUTANEOUS ART THROMBECTOMY/INFUSION INTRACRANIAL INC DIAG ANGIO  Result Date: 04/02/2022 INDICATION: 66 year old male with past medical history significant for arthritis (baseline modified Rankin scale 0) who presented earlier today with dizziness, right hemianopia, dysarthria and mild right-sided weakness. His NIHSS was fluctuating between 3 and 12. He was taken to mechanical thrombectomy with recanalization of a fetal left P2/PCA. He responded well to treatment with significant improvement of symptoms. However, around 9 p.m. on 04/01/2022, patient at the sudden declining neurological exam with right upper extremity weakness and vision loss. Repeat CT angiogram of the head and neck showed reocclusion of the left P2/PCA segment. He was then taken to our service for a diagnostic cerebral angiogram and left PCA revascularization. EXAM: ULTRASOUND-GUIDED VASCULAR ACCESS DIAGNOSTIC CEREBRAL ANGIOGRAM MECHANICAL THROMBECTOMY INTRACRANIAL STENTING FLAT PANEL HEAD CT COMPARISON:  Cerebral angiography April 01, 2022; CT angiogram of the head and neck April 01, 2022. MEDICATIONS: Cangrelor IV bolus and drip. Ticagrelor 180 mg and aspirin 81 mg via OG tube. ANESTHESIA/SEDATION:  The procedure was performed under general anesthesia. CONTRAST:  65 mL of Omnipaque 300 milligram/mL FLUOROSCOPY: Radiation Exposure Index (as provided by the fluoroscopic device): 0321 mGy Kerma COMPLICATIONS: None immediate. TECHNIQUE: Informed written consent was obtained from the patient's wife after a thorough discussion of the procedural risks, benefits and alternatives. All questions were addressed. Maximal Sterile Barrier Technique was utilized including caps, mask, sterile gowns, sterile gloves, sterile drape, hand hygiene and skin antiseptic. A timeout was performed prior to the initiation of the procedure. The right groin was prepped and draped in the usual sterile fashion. Using a micropuncture kit and the modified Seldinger technique, access was gained to the right common femoral artery and a 6 French sheath was placed. Real-time ultrasound guidance was utilized for vascular access including the acquisition of a permanent ultrasound image documenting patency of the accessed vessel. Under fluoroscopy, benchmark guide catheter was navigated over a 5 [place] Berenstein 2 catheter and a 0.035" Terumo Glidewire into the aortic arch. The catheter was  placed into the left common carotid artery and then advanced into the left internal carotid artery. The diagnostic catheter was removed. Frontal and lateral angiograms of the head were obtained. FINDINGS: 1. Normal caliber of the right common femoral artery, adequate for vascular access. 2. Reocclusion of the fetal left P2/PCA. PROCEDURE: Using biplane roadmap, a 5 [place] Sof[place] aspiration catheter was navigated over an SL 10 microcatheter and an Aristotle 14 microguidewire into the cavernous segment of the left ICA. The aspiration catheter was then advanced to the level of occlusion in the left P2/PCA segment and connected to an aspiration pump. Continuous aspiration was performed for 2 minutes. The guide catheter was connected to a VacLok syringe. The aspiration  catheter was subsequently removed under constant aspiration. The guide catheter was aspirated for debris. Left internal carotid artery angiograms with frontal and lateral views of the head showed minimal recanalization with persistent distal occlusion (TICI 1). Patient received a loading dose of cangrelor followed by continuous drip. Left internal carotid artery angiograms with left anterior oblique and lateral views of the head were obtained in utilized as biplane roadmap. Then, the [place] aspiration catheter was navigated over an SL 10 microcatheter and an Aristotle 14 microguidewire into the cavernous segment of the left ICA. The microcatheter was then navigated over the wire into the distal left P3/PCA segment. Frontal and lateral angiograms of the head were obtained via microcatheter contrast injection confirming adequate position of the microcatheter, distal to the occlusion. Next, a 4 x 24 mm neuroform atlas intracranial stent was deployed in the left P2 segment, across the occluded segment. Left internal carotid artery angiograms with frontal and lateral views of the head showed complete recanalization of the left PCA vascular tree (TICI3). Delayed angiograms showed preserved patency of the recently deployed stent. Flat panel CT of the head was obtained and post processed in a separate workstation with concurrent attending physician supervision. Selected images were sent to PACS. No evidence of hemorrhagic complication. At this point, and orogastric tube was placed and 180 mg of ticagrelor and 81 mg of aspirin were administered. Delayed angiograms showed preserved patency of the intracranial stent. Right common femoral artery angiogram was obtained in right anterior oblique view. The puncture is at the level of the common femoral artery. The artery has normal caliber, adequate for closure device. The sheath was exchanged over the wire for a 6 [place] Angio-Seal which was utilized for access closure.  Immediate hemostasis was achieved. IMPRESSION: Mechanical thrombectomy with direct contact aspiration rescue intracranial stenting performed for left P2/PCA reocclusion with complete recanalization (TICI3). No thromboembolic or hemorrhagic complication. PLAN: Transfer back to ICU for continued post stroke care. Electronically Signed   By: Pedro Earls M.D.   On: 04/02/2022 15:50   IR US Guide Vasc Access Right  Result Date: 04/02/2022 INDICATION: 66 year old male with past medical history significant for arthritis (baseline modified Rankin scale 0) who presented earlier today with dizziness, right hemianopia, dysarthria and mild right-sided weakness. His NIHSS was fluctuating between 3 and 12. He was taken to mechanical thrombectomy with recanalization of a fetal left P2/PCA. He responded well to treatment with significant improvement of symptoms. However, around 9 p.m. on 04/01/2022, patient at the sudden declining neurological exam with right upper extremity weakness and vision loss. Repeat CT angiogram of the head and neck showed reocclusion of the left P2/PCA segment. He was then taken to our service for a diagnostic cerebral angiogram and left PCA revascularization. EXAM: ULTRASOUND-GUIDED VASCULAR  ACCESS DIAGNOSTIC CEREBRAL ANGIOGRAM MECHANICAL THROMBECTOMY INTRACRANIAL STENTING FLAT PANEL HEAD CT COMPARISON:  Cerebral angiography April 01, 2022; CT angiogram of the head and neck April 01, 2022. MEDICATIONS: Cangrelor IV bolus and drip. Ticagrelor 180 mg and aspirin 81 mg via OG tube. ANESTHESIA/SEDATION: The procedure was performed under general anesthesia. CONTRAST:  65 mL of Omnipaque 300 milligram/mL FLUOROSCOPY: Radiation Exposure Index (as provided by the fluoroscopic device): 4854 mGy Kerma COMPLICATIONS: None immediate. TECHNIQUE: Informed written consent was obtained from the patient's wife after a thorough discussion of the procedural risks, benefits and alternatives. All questions  were addressed. Maximal Sterile Barrier Technique was utilized including caps, mask, sterile gowns, sterile gloves, sterile drape, hand hygiene and skin antiseptic. A timeout was performed prior to the initiation of the procedure. The right groin was prepped and draped in the usual sterile fashion. Using a micropuncture kit and the modified Seldinger technique, access was gained to the right common femoral artery and a 6 French sheath was placed. Real-time ultrasound guidance was utilized for vascular access including the acquisition of a permanent ultrasound image documenting patency of the accessed vessel. Under fluoroscopy, benchmark guide catheter was navigated over a 5 [place] Berenstein 2 catheter and a 0.035" Terumo Glidewire into the aortic arch. The catheter was placed into the left common carotid artery and then advanced into the left internal carotid artery. The diagnostic catheter was removed. Frontal and lateral angiograms of the head were obtained. FINDINGS: 1. Normal caliber of the right common femoral artery, adequate for vascular access. 2. Reocclusion of the fetal left P2/PCA. PROCEDURE: Using biplane roadmap, a 5 [place] Sof[place] aspiration catheter was navigated over an SL 10 microcatheter and an Aristotle 14 microguidewire into the cavernous segment of the left ICA. The aspiration catheter was then advanced to the level of occlusion in the left P2/PCA segment and connected to an aspiration pump. Continuous aspiration was performed for 2 minutes. The guide catheter was connected to a VacLok syringe. The aspiration catheter was subsequently removed under constant aspiration. The guide catheter was aspirated for debris. Left internal carotid artery angiograms with frontal and lateral views of the head showed minimal recanalization with persistent distal occlusion (TICI 1). Patient received a loading dose of cangrelor followed by continuous drip. Left internal carotid artery angiograms with left anterior  oblique and lateral views of the head were obtained in utilized as biplane roadmap. Then, the [place] aspiration catheter was navigated over an SL 10 microcatheter and an Aristotle 14 microguidewire into the cavernous segment of the left ICA. The microcatheter was then navigated over the wire into the distal left P3/PCA segment. Frontal and lateral angiograms of the head were obtained via microcatheter contrast injection confirming adequate position of the microcatheter, distal to the occlusion. Next, a 4 x 24 mm neuroform atlas intracranial stent was deployed in the left P2 segment, across the occluded segment. Left internal carotid artery angiograms with frontal and lateral views of the head showed complete recanalization of the left PCA vascular tree (TICI3). Delayed angiograms showed preserved patency of the recently deployed stent. Flat panel CT of the head was obtained and post processed in a separate workstation with concurrent attending physician supervision. Selected images were sent to PACS. No evidence of hemorrhagic complication. At this point, and orogastric tube was placed and 180 mg of ticagrelor and 81 mg of aspirin were administered. Delayed angiograms showed preserved patency of the intracranial stent. Right common femoral artery angiogram was obtained in right anterior oblique view.  The puncture is at the level of the common femoral artery. The artery has normal caliber, adequate for closure device. The sheath was exchanged over the wire for a 6 [place] Angio-Seal which was utilized for access closure. Immediate hemostasis was achieved. IMPRESSION: Mechanical thrombectomy with direct contact aspiration rescue intracranial stenting performed for left P2/PCA reocclusion with complete recanalization (TICI3). No thromboembolic or hemorrhagic complication. PLAN: Transfer back to ICU for continued post stroke care. Electronically Signed   By: Pedro Earls M.D.   On: 04/02/2022  15:50   IR CT Head Ltd  Result Date: 04/02/2022 INDICATION: 66 year old male with past medical history significant for arthritis (baseline modified Rankin scale 0) who presented earlier today with dizziness, right hemianopia, dysarthria and mild right-sided weakness. His NIHSS was fluctuating between 3 and 12. He was taken to mechanical thrombectomy with recanalization of a fetal left P2/PCA. He responded well to treatment with significant improvement of symptoms. However, around 9 p.m. on 04/01/2022, patient at the sudden declining neurological exam with right upper extremity weakness and vision loss. Repeat CT angiogram of the head and neck showed reocclusion of the left P2/PCA segment. He was then taken to our service for a diagnostic cerebral angiogram and left PCA revascularization. EXAM: ULTRASOUND-GUIDED VASCULAR ACCESS DIAGNOSTIC CEREBRAL ANGIOGRAM MECHANICAL THROMBECTOMY INTRACRANIAL STENTING FLAT PANEL HEAD CT COMPARISON:  Cerebral angiography April 01, 2022; CT angiogram of the head and neck April 01, 2022. MEDICATIONS: Cangrelor IV bolus and drip. Ticagrelor 180 mg and aspirin 81 mg via OG tube. ANESTHESIA/SEDATION: The procedure was performed under general anesthesia. CONTRAST:  65 mL of Omnipaque 300 milligram/mL FLUOROSCOPY: Radiation Exposure Index (as provided by the fluoroscopic device): 4174 mGy Kerma COMPLICATIONS: None immediate. TECHNIQUE: Informed written consent was obtained from the patient's wife after a thorough discussion of the procedural risks, benefits and alternatives. All questions were addressed. Maximal Sterile Barrier Technique was utilized including caps, mask, sterile gowns, sterile gloves, sterile drape, hand hygiene and skin antiseptic. A timeout was performed prior to the initiation of the procedure. The right groin was prepped and draped in the usual sterile fashion. Using a micropuncture kit and the modified Seldinger technique, access was gained to the right common  femoral artery and a 6 French sheath was placed. Real-time ultrasound guidance was utilized for vascular access including the acquisition of a permanent ultrasound image documenting patency of the accessed vessel. Under fluoroscopy, benchmark guide catheter was navigated over a 5 [place] Berenstein 2 catheter and a 0.035" Terumo Glidewire into the aortic arch. The catheter was placed into the left common carotid artery and then advanced into the left internal carotid artery. The diagnostic catheter was removed. Frontal and lateral angiograms of the head were obtained. FINDINGS: 1. Normal caliber of the right common femoral artery, adequate for vascular access. 2. Reocclusion of the fetal left P2/PCA. PROCEDURE: Using biplane roadmap, a 5 [place] aspiration catheter was navigated over an SL 10 microcatheter and an Aristotle 14 microguidewire into the cavernous segment of the left ICA. The aspiration catheter was then advanced to the level of occlusion in the left P2/PCA segment and connected to an aspiration pump. Continuous aspiration was performed for 2 minutes. The guide catheter was connected to a VacLok syringe. The aspiration catheter was subsequently removed under constant aspiration. The guide catheter was aspirated for debris. Left internal carotid artery angiograms with frontal and lateral views of the head showed minimal recanalization with persistent distal occlusion (TICI 1). Patient received a loading dose of cangrelor  followed by continuous drip. Left internal carotid artery angiograms with left anterior oblique and lateral views of the head were obtained in utilized as biplane roadmap. Then, the [place] aspiration catheter was navigated over an SL 10 microcatheter and an Aristotle 14 microguidewire into the cavernous segment of the left ICA. The microcatheter was then navigated over the wire into the distal left P3/PCA segment. Frontal and lateral angiograms of the head were obtained via  microcatheter contrast injection confirming adequate position of the microcatheter, distal to the occlusion. Next, a 4 x 24 mm neuroform atlas intracranial stent was deployed in the left P2 segment, across the occluded segment. Left internal carotid artery angiograms with frontal and lateral views of the head showed complete recanalization of the left PCA vascular tree (TICI3). Delayed angiograms showed preserved patency of the recently deployed stent. Flat panel CT of the head was obtained and post processed in a separate workstation with concurrent attending physician supervision. Selected images were sent to PACS. No evidence of hemorrhagic complication. At this point, and orogastric tube was placed and 180 mg of ticagrelor and 81 mg of aspirin were administered. Delayed angiograms showed preserved patency of the intracranial stent. Right common femoral artery angiogram was obtained in right anterior oblique view. The puncture is at the level of the common femoral artery. The artery has normal caliber, adequate for closure device. The sheath was exchanged over the wire for a 6 [place] Angio-Seal which was utilized for access closure. Immediate hemostasis was achieved. IMPRESSION: Mechanical thrombectomy with direct contact aspiration rescue intracranial stenting performed for left P2/PCA reocclusion with complete recanalization (TICI3). No thromboembolic or hemorrhagic complication. PLAN: Transfer back to ICU for continued post stroke care. Electronically Signed   By: Pedro Earls M.D.   On: 04/02/2022 15:50   IR PERCUTANEOUS ART THROMBECTOMY/INFUSION INTRACRANIAL INC DIAG ANGIO  Result Date: 04/02/2022 INDICATION: 66 year old male with past medical history significant for arthritis (baseline modified Rankin scale 0) presenting with dizziness, right hemianopia, dysarthria and mild right-sided weakness. His last known well was 3 p.m. on 04/01/2022. Initially, NIHSS was 3 with improvement to  NIHSS 1. However, within short interval patient declined to NIHSS 12. CT angiogram of the head and neck showed an occlusion of the proximal fetal left P2/PCA. Head CT showed no acute infarct or hemorrhage. The was brought to our service for emergency diagnostic cerebral angiogram and mechanical thrombectomy. EXAM: DIAGNOSTIC CEREBRAL ANGIOGRAM MECHANICAL THROMBECTOMY FLAT PANEL HEAD CT COMPARISON:  CT/CT angiogram of the head and neck April 01, 2022 MEDICATIONS: No antibiotics administered. ANESTHESIA/SEDATION: The procedure was performed in the general anesthesia. CONTRAST:  90 mL of Omnipaque 300 milligram/mL FLUOROSCOPY: Radiation Exposure Index (as provided by the fluoroscopic device): 194 mGy Kerma COMPLICATIONS: None immediate. TECHNIQUE: Informed written consent was obtained from the patient's wife after a thorough discussion of the procedural risks, benefits and alternatives. All questions were addressed. Maximal Sterile Barrier Technique was utilized including caps, mask, sterile gowns, sterile gloves, sterile drape, hand hygiene and skin antiseptic. A timeout was performed prior to the initiation of the procedure. The right groin was prepped and draped in the usual sterile fashion. Using a micropuncture kit and the modified Seldinger technique, access was gained to the right common femoral artery and an 8 French sheath was placed. Real-time ultrasound guidance was utilized for vascular access including the acquisition of a permanent ultrasound image documenting patency of the accessed vessel. Under fluoroscopy, a Zoom 88 guide catheter was navigated over a 6  [place] Berenstein 2 catheter and a 0.035" Terumo Glidewire into the aortic arch. The catheter was placed into the left common carotid artery and then advanced into the left internal carotid artery. The diagnostic catheter was removed. Left anterior oblique angiograms of the head was obtained. FINDINGS: 1. Normal caliber of the right common femoral  artery, adequate vascular access. 2. Fetal left PCA with occlusion of the proximal left P2 segment. 3. Patent left MCA and ACA vascular trees. PROCEDURE: Using biplane roadmap, a Zoom 55 aspiration catheter was navigated over an Aristotle 24 microguidewire into the cavernous segment of the right ICA. The aspiration catheter was then advanced to the level of occlusion and connected to an aspiration pump. Continuous aspiration was performed for 2 minutes. The guide catheter was connected to a VacLok syringe. The aspiration catheter was subsequently removed under constant aspiration. The guide catheter was aspirated for debris. Left internal carotid artery angiogram with left anterior oblique view of the head was obtained. Recanalization of the left PCA is noted with small, non flow limiting filling defect noted in the P2 segment. Delayed left internal carotid artery angiograms showed increase in size of left P2/PCA filling defect with persistent brisk anterograde flow. Using biplane roadmap, a Zoom 55 aspiration catheter was navigated over an Aristotle 24 microguidewire into the cavernous segment of the right ICA. The aspiration catheter was then advanced to the level of occlusion and connected to an aspiration pump. Continuous aspiration was performed for 2 minutes. The guide catheter was connected to a VacLok syringe. The aspiration catheter was subsequently removed under constant aspiration. The guide catheter was aspirated for debris. Left internal carotid artery angiogram with left anterior oblique view of the head was obtained. Decreased size of the filling defect noted. However, delayed angiograms showed worsening of the filling defect with distal slow flow. Using biplane roadmap guidance, a zoom 55 aspiration catheter was navigated over a phenom 21 microcatheter and a Aristotle 18 microguidewire into the cavernous segment of the left ICA. The microcatheter was then navigated over the wire into the left P3/PCA  segment. Then, a 3 x 20 mm solitaire stent retriever was deployed spanning the P2. The device was allowed to intercalated with the clot for 4 minutes. The microcatheter was removed. The aspiration catheter was advanced to the level of occlusion and connected to an aspiration pump. The thrombectomy device and aspiration catheter were removed under constant aspiration. Left internal carotid artery angiogram left anterior oblique view showed complete recanalization of the left PCA without residual filling defect. Flat panel CT of the head was obtained and post processed in a separate workstation with concurrent attending physician supervision. Selected images were sent to PACS. No evidence of hemorrhagic complication. The catheter was removed from the left internal carotid artery and then advanced into the left subclavian artery. Coaxial navigation of a 6 [place] Berenstein into the left vertebral artery was performed. Frontal angiogram of the head was obtained. Compartment patency of the left vertebral artery, basilar artery and right PCA. No opacification of the left PCA due to direct origin from the left internal carotid artery (fetal PCA). The catheter was subsequently withdrawn. Right common femoral artery angiogram was obtained in right anterior oblique view. The puncture is at the level of the common femoral artery. The artery has normal caliber, adequate for closure device. The sheath was exchanged over the wire for a Perclose ProGlide which was utilized for access closure. Immediate hemostasis was achieved. IMPRESSION: Successful mechanical thrombectomy for treatment of a  proximal occlusion of the P2 segment of the fetal left PCA. No evidence of thromboembolic or hemorrhagic complication. PLAN: Transfer to ICU for continued post stroke care. Electronically Signed   By: Pedro Earls M.D.   On: 04/02/2022 14:44   IR US Guide Vasc Access Right  Result Date: 04/02/2022 INDICATION: 66 year old  male with past medical history significant for arthritis (baseline modified Rankin scale 0) presenting with dizziness, right hemianopia, dysarthria and mild right-sided weakness. His last known well was 3 p.m. on 04/01/2022. Initially, NIHSS was 3 with improvement to NIHSS 1. However, within short interval patient declined to NIHSS 12. CT angiogram of the head and neck showed an occlusion of the proximal fetal left P2/PCA. Head CT showed no acute infarct or hemorrhage. The was brought to our service for emergency diagnostic cerebral angiogram and mechanical thrombectomy. EXAM: DIAGNOSTIC CEREBRAL ANGIOGRAM MECHANICAL THROMBECTOMY FLAT PANEL HEAD CT COMPARISON:  CT/CT angiogram of the head and neck April 01, 2022 MEDICATIONS: No antibiotics administered. ANESTHESIA/SEDATION: The procedure was performed in the general anesthesia. CONTRAST:  90 mL of Omnipaque 300 milligram/mL FLUOROSCOPY: Radiation Exposure Index (as provided by the fluoroscopic device): 366 mGy Kerma COMPLICATIONS: None immediate. TECHNIQUE: Informed written consent was obtained from the patient's wife after a thorough discussion of the procedural risks, benefits and alternatives. All questions were addressed. Maximal Sterile Barrier Technique was utilized including caps, mask, sterile gowns, sterile gloves, sterile drape, hand hygiene and skin antiseptic. A timeout was performed prior to the initiation of the procedure. The right groin was prepped and draped in the usual sterile fashion. Using a micropuncture kit and the modified Seldinger technique, access was gained to the right common femoral artery and an 8 French sheath was placed. Real-time ultrasound guidance was utilized for vascular access including the acquisition of a permanent ultrasound image documenting patency of the accessed vessel. Under fluoroscopy, a Zoom 88 guide catheter was navigated over a 6 [place] Berenstein 2 catheter and a 0.035" Terumo Glidewire into the aortic arch. The  catheter was placed into the left common carotid artery and then advanced into the left internal carotid artery. The diagnostic catheter was removed. Left anterior oblique angiograms of the head was obtained. FINDINGS: 1. Normal caliber of the right common femoral artery, adequate vascular access. 2. Fetal left PCA with occlusion of the proximal left P2 segment. 3. Patent left MCA and ACA vascular trees. PROCEDURE: Using biplane roadmap, a Zoom 55 aspiration catheter was navigated over an Aristotle 24 microguidewire into the cavernous segment of the right ICA. The aspiration catheter was then advanced to the level of occlusion and connected to an aspiration pump. Continuous aspiration was performed for 2 minutes. The guide catheter was connected to a VacLok syringe. The aspiration catheter was subsequently removed under constant aspiration. The guide catheter was aspirated for debris. Left internal carotid artery angiogram with left anterior oblique view of the head was obtained. Recanalization of the left PCA is noted with small, non flow limiting filling defect noted in the P2 segment. Delayed left internal carotid artery angiograms showed increase in size of left P2/PCA filling defect with persistent brisk anterograde flow. Using biplane roadmap, a Zoom 55 aspiration catheter was navigated over an Aristotle 24 microguidewire into the cavernous segment of the right ICA. The aspiration catheter was then advanced to the level of occlusion and connected to an aspiration pump. Continuous aspiration was performed for 2 minutes. The guide catheter was connected to a VacLok syringe. The aspiration catheter was subsequently  removed under constant aspiration. The guide catheter was aspirated for debris. Left internal carotid artery angiogram with left anterior oblique view of the head was obtained. Decreased size of the filling defect noted. However, delayed angiograms showed worsening of the filling defect with distal  slow flow. Using biplane roadmap guidance, a zoom 55 aspiration catheter was navigated over a phenom 21 microcatheter and a Aristotle 18 microguidewire into the cavernous segment of the left ICA. The microcatheter was then navigated over the wire into the left P3/PCA segment. Then, a 3 x 20 mm solitaire stent retriever was deployed spanning the P2. The device was allowed to intercalated with the clot for 4 minutes. The microcatheter was removed. The aspiration catheter was advanced to the level of occlusion and connected to an aspiration pump. The thrombectomy device and aspiration catheter were removed under constant aspiration. Left internal carotid artery angiogram left anterior oblique view showed complete recanalization of the left PCA without residual filling defect. Flat panel CT of the head was obtained and post processed in a separate workstation with concurrent attending physician supervision. Selected images were sent to PACS. No evidence of hemorrhagic complication. The catheter was removed from the left internal carotid artery and then advanced into the left subclavian artery. Coaxial navigation of a 6 [place] Berenstein into the left vertebral artery was performed. Frontal angiogram of the head was obtained. Compartment patency of the left vertebral artery, basilar artery and right PCA. No opacification of the left PCA due to direct origin from the left internal carotid artery (fetal PCA). The catheter was subsequently withdrawn. Right common femoral artery angiogram was obtained in right anterior oblique view. The puncture is at the level of the common femoral artery. The artery has normal caliber, adequate for closure device. The sheath was exchanged over the wire for a Perclose ProGlide which was utilized for access closure. Immediate hemostasis was achieved. IMPRESSION: Successful mechanical thrombectomy for treatment of a proximal occlusion of the P2 segment of the fetal left PCA. No evidence of  thromboembolic or hemorrhagic complication. PLAN: Transfer to ICU for continued post stroke care. Electronically Signed   By: Pedro Earls M.D.   On: 04/02/2022 14:44   IR CT Head Ltd  Result Date: 04/02/2022 INDICATION: 66 year old male with past medical history significant for arthritis (baseline modified Rankin scale 0) presenting with dizziness, right hemianopia, dysarthria and mild right-sided weakness. His last known well was 3 p.m. on 04/01/2022. Initially, NIHSS was 3 with improvement to NIHSS 1. However, within short interval patient declined to NIHSS 12. CT angiogram of the head and neck showed an occlusion of the proximal fetal left P2/PCA. Head CT showed no acute infarct or hemorrhage. The was brought to our service for emergency diagnostic cerebral angiogram and mechanical thrombectomy. EXAM: DIAGNOSTIC CEREBRAL ANGIOGRAM MECHANICAL THROMBECTOMY FLAT PANEL HEAD CT COMPARISON:  CT/CT angiogram of the head and neck April 01, 2022 MEDICATIONS: No antibiotics administered. ANESTHESIA/SEDATION: The procedure was performed in the general anesthesia. CONTRAST:  90 mL of Omnipaque 300 milligram/mL FLUOROSCOPY: Radiation Exposure Index (as provided by the fluoroscopic device): 594 mGy Kerma COMPLICATIONS: None immediate. TECHNIQUE: Informed written consent was obtained from the patient's wife after a thorough discussion of the procedural risks, benefits and alternatives. All questions were addressed. Maximal Sterile Barrier Technique was utilized including caps, mask, sterile gowns, sterile gloves, sterile drape, hand hygiene and skin antiseptic. A timeout was performed prior to the initiation of the procedure. The right groin was prepped and draped in the  usual sterile fashion. Using a micropuncture kit and the modified Seldinger technique, access was gained to the right common femoral artery and an 8 French sheath was placed. Real-time ultrasound guidance was utilized for vascular access  including the acquisition of a permanent ultrasound image documenting patency of the accessed vessel. Under fluoroscopy, a Zoom 88 guide catheter was navigated over a 6 [place] Berenstein 2 catheter and a 0.035" Terumo Glidewire into the aortic arch. The catheter was placed into the left common carotid artery and then advanced into the left internal carotid artery. The diagnostic catheter was removed. Left anterior oblique angiograms of the head was obtained. FINDINGS: 1. Normal caliber of the right common femoral artery, adequate vascular access. 2. Fetal left PCA with occlusion of the proximal left P2 segment. 3. Patent left MCA and ACA vascular trees. PROCEDURE: Using biplane roadmap, a Zoom 55 aspiration catheter was navigated over an Aristotle 24 microguidewire into the cavernous segment of the right ICA. The aspiration catheter was then advanced to the level of occlusion and connected to an aspiration pump. Continuous aspiration was performed for 2 minutes. The guide catheter was connected to a VacLok syringe. The aspiration catheter was subsequently removed under constant aspiration. The guide catheter was aspirated for debris. Left internal carotid artery angiogram with left anterior oblique view of the head was obtained. Recanalization of the left PCA is noted with small, non flow limiting filling defect noted in the P2 segment. Delayed left internal carotid artery angiograms showed increase in size of left P2/PCA filling defect with persistent brisk anterograde flow. Using biplane roadmap, a Zoom 55 aspiration catheter was navigated over an Aristotle 24 microguidewire into the cavernous segment of the right ICA. The aspiration catheter was then advanced to the level of occlusion and connected to an aspiration pump. Continuous aspiration was performed for 2 minutes. The guide catheter was connected to a VacLok syringe. The aspiration catheter was subsequently removed under constant aspiration. The guide  catheter was aspirated for debris. Left internal carotid artery angiogram with left anterior oblique view of the head was obtained. Decreased size of the filling defect noted. However, delayed angiograms showed worsening of the filling defect with distal slow flow. Using biplane roadmap guidance, a zoom 55 aspiration catheter was navigated over a phenom 21 microcatheter and a Aristotle 18 microguidewire into the cavernous segment of the left ICA. The microcatheter was then navigated over the wire into the left P3/PCA segment. Then, a 3 x 20 mm solitaire stent retriever was deployed spanning the P2. The device was allowed to intercalated with the clot for 4 minutes. The microcatheter was removed. The aspiration catheter was advanced to the level of occlusion and connected to an aspiration pump. The thrombectomy device and aspiration catheter were removed under constant aspiration. Left internal carotid artery angiogram left anterior oblique view showed complete recanalization of the left PCA without residual filling defect. Flat panel CT of the head was obtained and post processed in a separate workstation with concurrent attending physician supervision. Selected images were sent to PACS. No evidence of hemorrhagic complication. The catheter was removed from the left internal carotid artery and then advanced into the left subclavian artery. Coaxial navigation of a 6 [place] Berenstein into the left vertebral artery was performed. Frontal angiogram of the head was obtained. Compartment patency of the left vertebral artery, basilar artery and right PCA. No opacification of the left PCA due to direct origin from the left internal carotid artery (fetal PCA). The catheter was subsequently  withdrawn. Right common femoral artery angiogram was obtained in right anterior oblique view. The puncture is at the level of the common femoral artery. The artery has normal caliber, adequate for closure device. The sheath was exchanged  over the wire for a Perclose ProGlide which was utilized for access closure. Immediate hemostasis was achieved. IMPRESSION: Successful mechanical thrombectomy for treatment of a proximal occlusion of the P2 segment of the fetal left PCA. No evidence of thromboembolic or hemorrhagic complication. PLAN: Transfer to ICU for continued post stroke care. Electronically Signed   By: Pedro Earls M.D.   On: 04/02/2022 14:44   ECHOCARDIOGRAM COMPLETE  Result Date: 04/02/2022    ECHOCARDIOGRAM REPORT   Patient Name:   Vincent Oconnell Date of Exam: 04/02/2022 Medical Rec #:  [id number]     Height:       68.0 in Accession #:    [id number]    Weight:       193.6 lb Date of Birth:  1956-02-02     BSA:          2.016 m Patient Age:    47 years      BP:           135/77 mmHg Patient Gender: M             HR:           96 bpm. Exam Location:  Inpatient Procedure: 2D Echo, Cardiac Doppler and Color Doppler Indications:    Stroke  History:        Patient has no prior history of Echocardiogram examinations.  Sonographer:    Jyl Heinz Referring Phys: [id number] [place]  1. Left ventricular ejection fraction, by estimation, is 60 to 65%. The left ventricle has normal function. The left ventricle has no regional wall motion abnormalities. Left ventricular diastolic parameters were normal.  2. Right ventricular systolic function is normal. The right ventricular size is normal. Tricuspid regurgitation signal is inadequate for assessing PA pressure.  3. The mitral valve is grossly normal. Trivial mitral valve regurgitation. No evidence of mitral stenosis.  4. The aortic valve is tricuspid. Aortic valve regurgitation is not visualized. No aortic stenosis is present.  5. The inferior vena cava is normal in size with greater than 50% respiratory variability, suggesting right atrial pressure of 3 mmHg. Conclusion(s)/Recommendation(s): No intracardiac source of embolism detected on this transthoracic study.  Consider a transesophageal echocardiogram to exclude cardiac source of embolism if clinically indicated. FINDINGS  Left Ventricle: Left ventricular ejection fraction, by estimation, is 60 to 65%. The left ventricle has normal function. The left ventricle has no regional wall motion abnormalities. The left ventricular internal cavity size was normal in size. There is  no left ventricular hypertrophy. Left ventricular diastolic parameters were normal. Right Ventricle: The right ventricular size is normal. No increase in right ventricular wall thickness. Right ventricular systolic function is normal. Tricuspid regurgitation signal is inadequate for assessing PA pressure. Left Atrium: Left atrial size was normal in size. Right Atrium: Right atrial size was normal in size. Pericardium: Trivial pericardial effusion is present. Mitral Valve: The mitral valve is grossly normal. Trivial mitral valve regurgitation. No evidence of mitral valve stenosis. Tricuspid Valve: The tricuspid valve is grossly normal. Tricuspid valve regurgitation is trivial. No evidence of tricuspid stenosis. Aortic Valve: The aortic valve is tricuspid. Aortic valve regurgitation is not visualized. No aortic stenosis is present. Aortic valve peak gradient measures 9.4 mmHg. Pulmonic Valve: The pulmonic valve  was grossly normal. Pulmonic valve regurgitation is not visualized. No evidence of pulmonic stenosis. Aorta: The aortic root and ascending aorta are structurally normal, with no evidence of dilitation. Venous: The inferior vena cava is normal in size with greater than 50% respiratory variability, suggesting right atrial pressure of 3 mmHg. IAS/Shunts: The atrial septum is grossly normal.  LEFT VENTRICLE PLAX 2D LVIDd:         4.40 cm      Diastology LVIDs:         2.90 cm      LV e' medial:    8.08 cm/s LV PW:         1.00 cm      LV E/e' medial:  8.7 LV IVS:        1.00 cm      LV e' lateral:   9.32 cm/s LVOT diam:     2.10 cm      LV E/e'  lateral: 7.5 LV SV:         87 LV SV Index:   43 LVOT Area:     3.46 cm  LV Volumes (MOD) LV vol d, MOD A2C: 114.0 ml LV vol d, MOD A4C: 121.0 ml LV vol s, MOD A2C: 42.6 ml LV vol s, MOD A4C: 44.9 ml LV SV MOD A2C:     71.4 ml LV SV MOD A4C:     121.0 ml LV SV MOD BP:      75.5 ml RIGHT VENTRICLE             IVC RV Basal diam:  2.40 cm     IVC diam: 1.30 cm RV Mid diam:    2.10 cm RV S prime:     17.00 cm/s TAPSE (M-mode): 2.2 cm LEFT ATRIUM             Index        RIGHT ATRIUM          Index LA diam:        3.00 cm 1.49 cm/m   RA Area:     9.14 cm LA Vol (A2C):   28.8 ml 14.29 ml/m  RA Volume:   13.40 ml 6.65 ml/m LA Vol (A4C):   26.5 ml 13.15 ml/m LA Biplane Vol: 27.4 ml 13.59 ml/m  AORTIC VALVE AV Area (Vmax): 3.46 cm AV Vmax:        153.00 cm/s AV Peak Grad:   9.4 mmHg LVOT Vmax:      153.00 cm/s LVOT Vmean:     96.600 cm/s LVOT VTI:       0.250 m  AORTA Ao Root diam: 3.80 cm Ao Asc diam:  3.60 cm MITRAL VALVE MV Area (PHT): 5.70 cm    SHUNTS MV Decel Time: 133 msec    Systemic VTI:  0.25 m MV E velocity: 70.30 cm/s  Systemic Diam: 2.10 cm MV A velocity: 84.40 cm/s MV E/A ratio:  0.83 Eleonore Chiquito MD Electronically signed by Eleonore Chiquito MD Signature Date/Time: 04/02/2022/2:36:26 PM    Final    CT ANGIO HEAD NECK W WO CM  Result Date: 04/01/2022 CLINICAL DATA:  Code stroke EXAM: CT ANGIOGRAPHY HEAD AND NECK CT PERFUSION BRAIN TECHNIQUE: Multidetector CT imaging of the head and neck was performed using the standard protocol during bolus administration of intravenous contrast. Multiplanar CT image reconstructions and MIPs were obtained to evaluate the vascular anatomy. Carotid stenosis measurements (when applicable) are obtained utilizing NASCET criteria, using the distal internal carotid  diameter as the denominator. Multiphase CT imaging of the brain was performed following IV bolus contrast injection. Subsequent parametric perfusion maps were calculated using RAPID software. RADIATION DOSE  REDUCTION: This exam was performed according to the departmental dose-optimization program which includes automated exposure control, adjustment of the mA and/or kV according to patient size and/or use of iterative reconstruction technique. CONTRAST:  176mL OMNIPAQUE IOHEXOL 350 MG/ML SOLN COMPARISON:  None Available. FINDINGS: CTA NECK FINDINGS SKELETON: There is no bony spinal canal stenosis. No lytic or blastic lesion. OTHER NECK: Normal pharynx, larynx and major salivary glands. No cervical lymphadenopathy. Unremarkable thyroid gland. UPPER CHEST: No pneumothorax or pleural effusion. No nodules or masses. AORTIC ARCH: There is no calcific atherosclerosis of the aortic arch. There is no aneurysm, dissection or hemodynamically significant stenosis of the visualized portion of the aorta. Conventional 3 vessel aortic branching pattern. The visualized proximal subclavian arteries are widely patent. RIGHT CAROTID SYSTEM: No dissection, occlusion or aneurysm. Mild atherosclerotic calcification at the carotid bifurcation without hemodynamically significant stenosis. LEFT CAROTID SYSTEM: No dissection, occlusion or aneurysm. Mild atherosclerotic calcification at the carotid bifurcation without hemodynamically significant stenosis. VERTEBRAL ARTERIES: Codominant configuration. Both origins are clearly patent. There is no dissection, occlusion or flow-limiting stenosis to the skull base (V1-V3 segments). CTA HEAD FINDINGS POSTERIOR CIRCULATION: --Vertebral arteries: Normal V4 segments. --Inferior cerebellar arteries: Normal. --Basilar artery: Normal. --Superior cerebellar arteries: Normal. --Posterior cerebral arteries (PCA): Left PCA P2 segment remains occluded. There are fetal predominant origins of both posterior cerebral arteries. ANTERIOR CIRCULATION: --Intracranial internal carotid arteries: Normal. --Anterior cerebral arteries (ACA): Normal. Both A1 segments are present. Patent anterior communicating artery  (a-comm). --Middle cerebral arteries (MCA): Normal. VENOUS SINUSES: As permitted by contrast timing, patent. ANATOMIC VARIANTS: None Review of the MIP images confirms the above findings. CT Brain Perfusion Findings: ASPECTS: 10 at 5:02 p.m. on 04/01/2022 CBF (<30%) Volume: 68mL Perfusion (Tmax>6.0s) volume: 23mL Mismatch Volume: 32mL Infarction Location:No infarction. The area of ischemia is located in the left PCA territory. IMPRESSION: 1. Occlusion of the left PCA P2 segment, with 34 mL area of ischemic penumbra within the left PCA territory. No infarct by CT perfusion analysis. 2. Bilateral carotid bifurcation atherosclerosis without hemodynamically significant stenosis by NASCET criteria. Electronically Signed   By: Ulyses Jarred M.D.   On: 04/01/2022 22:36   CT CEREBRAL PERFUSION W CONTRAST  Result Date: 04/01/2022 CLINICAL DATA:  Code stroke EXAM: CT ANGIOGRAPHY HEAD AND NECK CT PERFUSION BRAIN TECHNIQUE: Multidetector CT imaging of the head and neck was performed using the standard protocol during bolus administration of intravenous contrast. Multiplanar CT image reconstructions and MIPs were obtained to evaluate the vascular anatomy. Carotid stenosis measurements (when applicable) are obtained utilizing NASCET criteria, using the distal internal carotid diameter as the denominator. Multiphase CT imaging of the brain was performed following IV bolus contrast injection. Subsequent parametric perfusion maps were calculated using RAPID software. RADIATION DOSE REDUCTION: This exam was performed according to the departmental dose-optimization program which includes automated exposure control, adjustment of the mA and/or kV according to patient size and/or use of iterative reconstruction technique. CONTRAST:  161mL OMNIPAQUE IOHEXOL 350 MG/ML SOLN COMPARISON:  None Available. FINDINGS: CTA NECK FINDINGS SKELETON: There is no bony spinal canal stenosis. No lytic or blastic lesion. OTHER NECK: Normal pharynx,  larynx and major salivary glands. No cervical lymphadenopathy. Unremarkable thyroid gland. UPPER CHEST: No pneumothorax or pleural effusion. No nodules or masses. AORTIC ARCH: There is no calcific atherosclerosis of the aortic arch. There is no  aneurysm, dissection or hemodynamically significant stenosis of the visualized portion of the aorta. Conventional 3 vessel aortic branching pattern. The visualized proximal subclavian arteries are widely patent. RIGHT CAROTID SYSTEM: No dissection, occlusion or aneurysm. Mild atherosclerotic calcification at the carotid bifurcation without hemodynamically significant stenosis. LEFT CAROTID SYSTEM: No dissection, occlusion or aneurysm. Mild atherosclerotic calcification at the carotid bifurcation without hemodynamically significant stenosis. VERTEBRAL ARTERIES: Codominant configuration. Both origins are clearly patent. There is no dissection, occlusion or flow-limiting stenosis to the skull base (V1-V3 segments). CTA HEAD FINDINGS POSTERIOR CIRCULATION: --Vertebral arteries: Normal V4 segments. --Inferior cerebellar arteries: Normal. --Basilar artery: Normal. --Superior cerebellar arteries: Normal. --Posterior cerebral arteries (PCA): Left PCA P2 segment remains occluded. There are fetal predominant origins of both posterior cerebral arteries. ANTERIOR CIRCULATION: --Intracranial internal carotid arteries: Normal. --Anterior cerebral arteries (ACA): Normal. Both A1 segments are present. Patent anterior communicating artery (a-comm). --Middle cerebral arteries (MCA): Normal. VENOUS SINUSES: As permitted by contrast timing, patent. ANATOMIC VARIANTS: None Review of the MIP images confirms the above findings. CT Brain Perfusion Findings: ASPECTS: 10 at 5:02 p.m. on 04/01/2022 CBF (<30%) Volume: 20mL Perfusion (Tmax>6.0s) volume: 18mL Mismatch Volume: 77mL Infarction Location:No infarction. The area of ischemia is located in the left PCA territory. IMPRESSION: 1. Occlusion of the  left PCA P2 segment, with 34 mL area of ischemic penumbra within the left PCA territory. No infarct by CT perfusion analysis. 2. Bilateral carotid bifurcation atherosclerosis without hemodynamically significant stenosis by NASCET criteria. Electronically Signed   By: Ulyses Jarred M.D.   On: 04/01/2022 22:36   CT HEAD WO CONTRAST (5MM)  Result Date: 04/01/2022 CLINICAL DATA:  Acute neurologic deficit EXAM: CT HEAD WITHOUT CONTRAST TECHNIQUE: Contiguous axial images were obtained from the base of the skull through the vertex without intravenous contrast. RADIATION DOSE REDUCTION: This exam was performed according to the departmental dose-optimization program which includes automated exposure control, adjustment of the mA and/or kV according to patient size and/or use of iterative reconstruction technique. COMPARISON:  None Available. FINDINGS: Brain: There is no mass, hemorrhage or extra-axial collection. The size and configuration of the ventricles and extra-axial CSF spaces are normal. The brain parenchyma is normal, without acute or chronic infarction. Vascular: No abnormal hyperdensity of the major intracranial arteries or dural venous sinuses. No intracranial atherosclerosis. Skull: The visualized skull base, calvarium and extracranial soft tissues are normal. Sinuses/Orbits: No fluid levels or advanced mucosal thickening of the visualized paranasal sinuses. No mastoid or middle ear effusion. The orbits are normal. IMPRESSION: Normal head CT. Electronically Signed   By: Ulyses Jarred M.D.   On: 04/01/2022 21:06   CT ANGIO HEAD NECK W WO CM  Result Date: 04/01/2022 CLINICAL DATA:  Code stroke follow-up EXAM: CT ANGIOGRAPHY HEAD AND NECK TECHNIQUE: Multidetector CT imaging of the head and neck was performed using the standard protocol during bolus administration of intravenous contrast. Multiplanar CT image reconstructions and MIPs were obtained to evaluate the vascular anatomy. Carotid stenosis  measurements (when applicable) are obtained utilizing NASCET criteria, using the distal internal carotid diameter as the denominator. RADIATION DOSE REDUCTION: This exam was performed according to the departmental dose-optimization program which includes automated exposure control, adjustment of the mA and/or kV according to patient size and/or use of iterative reconstruction technique. CONTRAST:  54mL OMNIPAQUE IOHEXOL 350 MG/ML SOLN COMPARISON:  Same day CTA head/neck FINDINGS: CTA NECK FINDINGS Aortic arch: The imaged aortic arch is unremarkable. The origins of the major branch vessels are patent. The subclavian arteries are patent to  the level imaged. Right carotid system: The right common, internal, and external carotid arteries are patent with minimal plaque at the bifurcation but no hemodynamically significant stenosis or occlusion. There is no dissection or aneurysm. Left carotid system: The left common, internal, and external carotid arteries are patent with minimal plaque at the bifurcation but no hemodynamically significant stenosis or occlusion. There is no dissection or aneurysm. Vertebral arteries: Vertebral arteries are patent, without hemodynamically significant stenosis or occlusion. There is no dissection or aneurysm. Skeleton: There is mild multilevel degenerative change of the cervical spine. There is no acute osseous abnormality or suspicious osseous lesion. There is no visible canal hematoma. Other neck: Soft tissues of the neck are unremarkable. Upper chest: The imaged lung apices are clear. Review of the MIP images confirms the above findings CTA HEAD FINDINGS Anterior circulation: There is mild calcified plaque in the left intracranial ICA without hemodynamically significant stenosis or occlusion. The bilateral MCAs are patent without proximal high-grade stenosis or occlusion. Specifically, the previously suspected left M2 occlusion/stenosis is not seen and was likely artifactual. The  bilateral ACAs are patent. The anterior communicating artery is normal. There is no aneurysm or AVM. Posterior circulation: The bilateral V4 segments are patent. PICA is identified bilaterally. The basilar artery is patent There is a fetal origin of the left PCA. There is short-segment critical stenosis/occlusion of the left P2 segment (7-120) with distal reconstitution. There is irregularity of the PCA distal to this point without other evidence of occlusion. The right PCA is patent. The right posterior communicating artery is also identified. There is no aneurysm or AVM. Venous sinuses: Patent. Anatomic variants: As above. Review of the MIP images confirms the above findings IMPRESSION: 1. Short-segment critical stenosis/occlusion of the left PCA with reconstitution. No other definite occlusion is seen in the left PCA. 2. Otherwise patent intracranial vasculature. The previously suspected left M2 stenosis/occlusion is not seen on the current study and was likely artifactual. 3. Patent vasculature of the neck with no hemodynamically significant stenosis or occlusion. Electronically Signed   By: Valetta Mole M.D.   On: 04/01/2022 17:20   CT HEAD WO CONTRAST (5MM)  Result Date: 04/01/2022 CLINICAL DATA:  Stroke suspected, worsening symptoms after TNK EXAM: CT HEAD WITHOUT CONTRAST TECHNIQUE: Contiguous axial images were obtained from the base of the skull through the vertex without intravenous contrast. RADIATION DOSE REDUCTION: This exam was performed according to the departmental dose-optimization program which includes automated exposure control, adjustment of the mA and/or kV according to patient size and/or use of iterative reconstruction technique. COMPARISON:  04/01/2022 FINDINGS: Brain: No evidence of acute infarction, hemorrhage, cerebral edema, mass, mass effect, or midline shift. No hydrocephalus or extra-axial fluid collection. Vascular: No hyperdense vessel. Contrast is noted in the vasculature from  recent CTA. Skull: Normal. Negative for fracture or focal lesion. Sinuses/Orbits: No acute finding. Other: The mastoid air cells are well aerated. IMPRESSION: No acute intracranial process. Electronically Signed   By: Merilyn Baba M.D.   On: 04/01/2022 17:07   CT HEAD CODE STROKE WO CONTRAST  Result Date: 04/01/2022 CLINICAL DATA:  Right eye vision loss and slurred speech EXAM: CT ANGIOGRAPHY HEAD AND NECK TECHNIQUE: Multidetector CT imaging of the head and neck was performed using the standard protocol during bolus administration of intravenous contrast. Multiplanar CT image reconstructions and MIPs were obtained to evaluate the vascular anatomy. Carotid stenosis measurements (when applicable) are obtained utilizing NASCET criteria, using the distal internal carotid diameter as the denominator. RADIATION DOSE  REDUCTION: This exam was performed according to the departmental dose-optimization program which includes automated exposure control, adjustment of the mA and/or kV according to patient size and/or use of iterative reconstruction technique. CONTRAST:  173mL OMNIPAQUE IOHEXOL 350 MG/ML SOLN COMPARISON:  None Available. FINDINGS: CT HEAD FINDINGS Brain: There is no acute intracranial hemorrhage, extra-axial fluid collection, or acute infarct. Parenchymal volume is normal. The ventricles are normal in size. Gray-white differentiation is preserved. There is no mass lesion. There is no mass effect or midline shift. Vascular: No dense vessel is seen. Skull: Normal. Negative for fracture or focal lesion. Sinuses/Orbits: Imaged paranasal sinuses are clear. The globes and orbits are unremarkable. Other: None. ASPECTS ([place] Stroke Program Early CT Score) - Ganglionic level infarction (caudate, lentiform nuclei, internal capsule, insula, M1-M3 cortex): 7 - Supraganglionic infarction (M4-M6 cortex): 3 Total score (0-10 with 10 being normal): 10 CTA NECK FINDINGS The CTA images are markedly degraded by poor bolus  timing. Aortic arch: The imaged aortic arch is grossly unremarkable. The origins of the major branch vessels appear patent. The subclavian arteries appear patent to the level imaged. Right carotid system: There is minimal plaque at the carotid bifurcation. There is no definite hemodynamically significant stenosis or occlusion. There is no definite dissection or aneurysm. Left carotid system: There is scattered mild soft plaque in the left common carotid artery without hemodynamically significant stenosis. There is minimal plaque at the bifurcation without definite hemodynamically significant stenosis or occlusion. There is no definite dissection or aneurysm. Vertebral arteries: The V1 and proximal V2 segments of the vertebral arteries are suboptimally evaluated. The distal V2 and V3 segments appear patent without definite hemodynamically significant stenosis or occlusion. There is no definite dissection or aneurysm. Skeleton: Other neck: Upper chest: Review of the MIP images confirms the above findings CTA HEAD FINDINGS Anterior circulation: There is no definite hemodynamically significant stenosis or occlusion in the intracranial ICAs Bilateral M1 segments appear grossly patent. There is possible high-grade stenosis or occlusion of a left M2 segment in the sylvian fissure (18-107, 16-14). There is no definite proximal high-grade stenosis or occlusion on the right, though evaluation is markedly suboptimal due to bolus timing. There is no definite proximal high-grade stenosis or occlusion in the anterior cerebral arteries within the above confines. There is no definite aneurysm. Posterior circulation: The bilateral V4 segments and basilar artery are grossly patent without definite proximal high-grade stenosis or occlusion. There is short-segment high-grade stenosis/occlusion of the left P2 segment (5-47, 18-114). There is apparent reconstitution, though there is a suspected additional occlusion distally (18-133).  Bilateral posterior communicating arteries are identified with a fetal origin on the left. There is no definite proximal high-grade stenosis or occlusion on the right. There is no definite aneurysm. Venous sinuses: Patent. Anatomic variants: As above. Review of the MIP images confirms the above findings IMPRESSION: 1. No acute intracranial hemorrhage or infarct.  ASPECTS is 10. 2. The CTA image quality is markedly degraded by poor bolus timing with significantly suboptimal evaluation of the distal intracranial vasculature. 3. Short-segment occlusion of the left P2 segment. There is apparent reconstitution, but there is a suspected additional occlusion distally. 4. Suspected high-grade stenosis or occlusion of a left M2 segment in the sylvian fissure. 5. No other definite proximal high-grade stenosis or occlusion in the intracranial vasculature. 6. No definite hemodynamically significant stenosis or occlusion in the vasculature of the neck within the above confines. The results of the initial noncontrast head CT were called by telephone at the time  of interpretation on 04/01/2022 at 4:12 pm to provider Luciano Cutter NP, who verbally acknowledged these results. The CTA findings were discussed with Dr. Curly Shores at 4:45pm. Electronically Signed   By: Valetta Mole M.D.   On: 04/01/2022 17:02   CT ANGIO HEAD NECK W WO CM (CODE STROKE)  Result Date: 04/01/2022 CLINICAL DATA:  Right eye vision loss and slurred speech EXAM: CT ANGIOGRAPHY HEAD AND NECK TECHNIQUE: Multidetector CT imaging of the head and neck was performed using the standard protocol during bolus administration of intravenous contrast. Multiplanar CT image reconstructions and MIPs were obtained to evaluate the vascular anatomy. Carotid stenosis measurements (when applicable) are obtained utilizing NASCET criteria, using the distal internal carotid diameter as the denominator. RADIATION DOSE REDUCTION: This exam was performed according to the departmental  dose-optimization program which includes automated exposure control, adjustment of the mA and/or kV according to patient size and/or use of iterative reconstruction technique. CONTRAST:  167mL OMNIPAQUE IOHEXOL 350 MG/ML SOLN COMPARISON:  None Available. FINDINGS: CT HEAD FINDINGS Brain: There is no acute intracranial hemorrhage, extra-axial fluid collection, or acute infarct. Parenchymal volume is normal. The ventricles are normal in size. Gray-white differentiation is preserved. There is no mass lesion. There is no mass effect or midline shift. Vascular: No dense vessel is seen. Skull: Normal. Negative for fracture or focal lesion. Sinuses/Orbits: Imaged paranasal sinuses are clear. The globes and orbits are unremarkable. Other: None. ASPECTS ([place] Stroke Program Early CT Score) - Ganglionic level infarction (caudate, lentiform nuclei, internal capsule, insula, M1-M3 cortex): 7 - Supraganglionic infarction (M4-M6 cortex): 3 Total score (0-10 with 10 being normal): 10 CTA NECK FINDINGS The CTA images are markedly degraded by poor bolus timing. Aortic arch: The imaged aortic arch is grossly unremarkable. The origins of the major branch vessels appear patent. The subclavian arteries appear patent to the level imaged. Right carotid system: There is minimal plaque at the carotid bifurcation. There is no definite hemodynamically significant stenosis or occlusion. There is no definite dissection or aneurysm. Left carotid system: There is scattered mild soft plaque in the left common carotid artery without hemodynamically significant stenosis. There is minimal plaque at the bifurcation without definite hemodynamically significant stenosis or occlusion. There is no definite dissection or aneurysm. Vertebral arteries: The V1 and proximal V2 segments of the vertebral arteries are suboptimally evaluated. The distal V2 and V3 segments appear patent without definite hemodynamically significant stenosis or occlusion. There is  no definite dissection or aneurysm. Skeleton: Other neck: Upper chest: Review of the MIP images confirms the above findings CTA HEAD FINDINGS Anterior circulation: There is no definite hemodynamically significant stenosis or occlusion in the intracranial ICAs Bilateral M1 segments appear grossly patent. There is possible high-grade stenosis or occlusion of a left M2 segment in the sylvian fissure (18-107, 16-14). There is no definite proximal high-grade stenosis or occlusion on the right, though evaluation is markedly suboptimal due to bolus timing. There is no definite proximal high-grade stenosis or occlusion in the anterior cerebral arteries within the above confines. There is no definite aneurysm. Posterior circulation: The bilateral V4 segments and basilar artery are grossly patent without definite proximal high-grade stenosis or occlusion. There is short-segment high-grade stenosis/occlusion of the left P2 segment (5-47, 18-114). There is apparent reconstitution, though there is a suspected additional occlusion distally (18-133). Bilateral posterior communicating arteries are identified with a fetal origin on the left. There is no definite proximal high-grade stenosis or occlusion on the right. There is no definite aneurysm. Venous sinuses: Patent.  Anatomic variants: As above. Review of the MIP images confirms the above findings IMPRESSION: 1. No acute intracranial hemorrhage or infarct.  ASPECTS is 10. 2. The CTA image quality is markedly degraded by poor bolus timing with significantly suboptimal evaluation of the distal intracranial vasculature. 3. Short-segment occlusion of the left P2 segment. There is apparent reconstitution, but there is a suspected additional occlusion distally. 4. Suspected high-grade stenosis or occlusion of a left M2 segment in the sylvian fissure. 5. No other definite proximal high-grade stenosis or occlusion in the intracranial vasculature. 6. No definite hemodynamically  significant stenosis or occlusion in the vasculature of the neck within the above confines. The results of the initial noncontrast head CT were called by telephone at the time of interpretation on 04/01/2022 at 4:12 pm to provider Luciano Cutter NP, who verbally acknowledged these results. The CTA findings were discussed with Dr. Curly Shores at 4:45pm. Electronically Signed   By: Valetta Mole M.D.   On: 04/01/2022 17:02      HISTORY OF PRESENT ILLNESS Ilai Hiller is a 66 y.o. male with a past medical history significant for arthritis, no other known medical problems and not on any medications.    He has been in his usual state of health and had been feeling well including mowing the lawn this morning.  He had gone to the gym and was working out his leg muscles.  He got up and adjusted the weights (was not in the middle of straining), when he suddenly felt dizzy and had to sit down on the weight bench.  He noted he could not see on the right side.  On EMS arrival they noted that he had significant right arm and leg weakness as well.   He initially had an NIH stroke scale of 3 (partial hemianopia on the right side, right leg drift, mild dysarthria, one-point each).  TNK checklist was reviewed and patient was consented with appropriate delivery of TNK after negative head CT.  Subsequently he developed worsening symptoms with an NIH as high as 12 (2 points for drowsiness, 2 points for full right hemianopia, 3 points for right arm weakness, 3 points for right leg weakness, one-point for sensory loss on the right side, one-point for dysarthria).  Head CT was repeated and did not show any hemorrhage. Initial CTA was poor diagnostic quality and therefore was repeated and demonstrated left P2 occlusion for which risks and benefits of thrombectomy were discussed with wife.  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  Head CT without contrast with no acute process. Ct angio head and neck was a poor quality scan however there was concern for a left M2 as well as left P2 occlusion. CT angio head was repeated and revealed Short-segment critical stenosis/occlusion of the left PCA with reconstitution. No other definite occlusion is seen in the left PCA.  He underwent mechanical thrombectomy with Dr. Karenann Cai; findings were Occlusion of the proximal P2 segment of the fetal left PCA. Mechanical thrombectomy performed with direct contact aspiration with complete recanalization. Small non occlusive filling defect noted. One additional aspiration pass performed with persistent filling defect and eventual reocclusion. One pass with direct contact aspiration and  stent retriever performed with complete recanalization without residual filling defect. No thromboembolic or hemorrhagic complication.  Patient unfortunately, had a worsening neurological exam later this same evening with worsening RUE weakness and vision loss. Repeat CT angio head and neck and perfusion revealed reocclusion of the L PCA along with mismatch in the L PCA territory with no core. He then went back to IR suite for thrombectomy and stenting, findings were Reocclusion of the proximal left P2/PCA. One direct contact aspiration pass performed with minimal recanalization (TICI 1). Cangrelor load dose administered and a 4 x 24 mm neuroform atlas stent was deployed across the occlusion with complete recanalization. No thromboembolic or hemorrhagic complication. MR brain revealed  Acute left PCA territory infarcts, described above. Associated edema and petechial hemorrhage without mass occupying acute hemorrhage or significant mass effect He has been  started on 81mg  Aspirin and Brilinta 90 mg BID for 6 months then ASA alone.  Cardiac echo revealed EF 60-65%. Loop recorder will be placed prior to discharge. LDL-c was 128, not on medications at home. We have started atorvastatin 80mg  daily for a LDL goal of <70. HGBA1c was 5.9 and well controlled.  Therapy team evaluated patient and recommended outpatient PT/ OT and ST and referral has been made. Patient was instructed to not drive until cleared by MD. Patient also instructed to follow up with opthalmology as well.  Patient was deemed safe and appropriate to be discharged home. All questions were answered and provided by Dr. Leonie Man.     DISCHARGE EXAM Blood pressure (!) 156/87, pulse 78, temperature 98.5 F (36.9 C), resp. rate 11, height 5\' 8"  (1.727 m), weight 87.8 kg, SpO2 94 %. PHYSICAL EXAM Constitutional: Appears well-developed and well-nourished, muscular middle-aged African-American male.  Psych: Affect appropriate to situation, calm and cooperative Eyes: No scleral injection HENT: No oropharyngeal obstruction.  MSK: no joint deformities.  Cardiovascular: Normal rate and regular rhythm. Perfusing extremities well Respiratory: Effort normal, non-labored breathing GI: Soft.  No distension. There is no tenderness.  Skin: Warm dry and intact visible skin   Neurological Exam: He is alert and oriented x 4.  PERRL, crosses midline, tracks. Has right visual field cut. Slight ataxia on right arm 5/5 in all 4 extremities. Decreased sensation on right diminished fine finger movements on the right.   No focal weakness    Discharge Diet       Diet   Diet regular Room service appropriate? Yes; Fluid consistency: Thin   liquids  DISCHARGE PLAN Disposition:  home aspirin 81 mg daily and Brilinta (ticagrelor) 90 mg bid for secondary stroke prevention for 6 months then ASA alone. Ongoing stroke risk factor control by Primary Care Physician at time of discharge Follow-up PCP Patient, No Pcp  Per in 2 weeks.Referral has been sent  Follow-up in [facility] in 8 weeks, office to schedule an appointment.  Follow up with ophthalmology in 2 months  No Driving until cleared by MD   50 minutes were spent preparing discharge.  Beulah Gandy DNP, ACNPc-AG   I have personally obtained history,examined this patient, reviewed notes, independently viewed imaging studies, participated in medical decision making and plan of care.ROS completed by me personally and pertinent positives fully documented  I have made any additions or clarifications directly to the above note. Agree with note above.    Antony Contras, MD Medical Director [facility] Pager: [phone] 04/04/2022 4:03 PM

## 2022-04-04 NOTE — Progress Notes (Signed)
Physical Therapy Treatment Patient Details Name: Vincent Oconnell MRN: [id number] DOB: Jan 01, 1956 Today's Date: 04/04/2022   History of Present Illness 66 y/o male presented to ED on 04/01/22 for R sided vision loss and dizziness. TNK given on 7/17 after CT head was negative with symptoms worsening. CTA showed L PCA occlusion. S/p mechanical thrombectomy with stent placement on 7/18. MRI showed acute L PCA infarcts. No significant PMH.    PT Comments    The pt was agreeable to session, reports feeling better this afternoon. The pt was able to complete hallway ambulation without need for UE support or LOB, even with balance challenges. The pt continues to ambulate with slowed speed, but was able to perform visual scanning effectively to avoid obstacles and navigate stairs. The pt did have single episode of incoordinated step placement with RLE on stairs, but was able to maintain good stability with movement. The pt was educated on continued walking and activity to continue at home, as well as fall risk reduction strategies. Will continue to benefit from skilled PT acutely as well as OP neuro PT after d/c to maximize functional independence and return to prior level of function and work.      Recommendations for follow up therapy are one component of a multi-disciplinary discharge planning process, led by the attending physician.  Recommendations may be updated based on patient status, additional functional criteria and insurance authorization.  Follow Up Recommendations  Outpatient PT     Assistance Recommended at Discharge Intermittent Supervision/Assistance  Patient can return home with the following A little help with walking and/or transfers;A little help with bathing/dressing/bathroom;Assistance with cooking/housework;Direct supervision/assist for medications management;Direct supervision/assist for financial management;Assist for transportation;Help with stairs or ramp for entrance   Equipment  Recommendations  None recommended by PT    Recommendations for Other Services       Precautions / Restrictions Precautions Precautions: Fall Precaution Comments: R hemianopsia Restrictions Weight Bearing Restrictions: No     Mobility  Bed Mobility               General bed mobility comments: pt OOB in recliner at start and end of session    Transfers Overall transfer level: Needs assistance Equipment used: None Transfers: Sit to/from Stand Sit to Stand: Supervision           General transfer comment: supervision with cues to avoid hand use, completed x10 in session    Ambulation/Gait Ambulation/Gait assistance: Min guard Gait Distance (Feet): 300 Feet Assistive device: None Gait Pattern/deviations: Step-through pattern, Decreased stride length Gait velocity: decreased     General Gait Details: pt able to maintain without LOB or physical assist. tolerated challenge with minG and no overt LOB   Stairs Stairs: Yes Stairs assistance: Min guard Stair Management: One rail Left, Alternating pattern, Step to pattern Number of Stairs: 10 General stair comments: alternating ascending, step-to descending.   Wheelchair Mobility    Modified Rankin (Stroke Patients Only) Modified Rankin (Stroke Patients Only) Pre-Morbid Rankin Score: No symptoms Modified Rankin: Moderate disability     Balance Overall balance assessment: Needs assistance Sitting-balance support: Feet supported Sitting balance-Leahy Scale: Good     Standing balance support: No upper extremity supported Standing balance-Leahy Scale: Fair                              Cognition Arousal/Alertness: Awake/alert Behavior During Therapy: WFL for tasks assessed/performed Overall Cognitive Status: Impaired/Different from baseline Area of Impairment:  Attention, Safety/judgement, Problem solving, Awareness                   Current Attention Level: Selective      Safety/Judgement: Decreased awareness of deficits, Decreased awareness of safety Awareness: Emergent Problem Solving: Slow processing General Comments: pt able to follow all instructions, following cues for balance challenges well. aware of deficits and able to avoid obstacles well with hallway navigation.        Exercises Other Exercises Other Exercises: x5 sit-stand without use of UE Other Exercises: x5 sit-stand with LLE in front of RLE    General Comments General comments (skin integrity, edema, etc.): VSS on RA      Pertinent Vitals/Pain Pain Assessment Pain Assessment: No/denies pain           PT Goals (current goals can now be found in the care plan section) Acute Rehab PT Goals Patient Stated Goal: to get better PT Goal Formulation: With patient Time For Goal Achievement: 04/17/22 Potential to Achieve Goals: Good Progress towards PT goals: Progressing toward goals    Frequency    Min 4X/week      PT Plan Current plan remains appropriate       AM-PAC PT "6 Clicks" Mobility   Outcome Measure  Help needed turning from your back to your side while in a flat bed without using bedrails?: None Help needed moving from lying on your back to sitting on the side of a flat bed without using bedrails?: None Help needed moving to and from a bed to a chair (including a wheelchair)?: A Little Help needed standing up from a chair using your arms (e.g., wheelchair or bedside chair)?: A Little Help needed to walk in hospital room?: A Little Help needed climbing 3-5 steps with a railing? : A Little 6 Click Score: 20    End of Session Equipment Utilized During Treatment: Gait belt Activity Tolerance: Patient tolerated treatment well Patient left: with call bell/phone within reach;in chair;with family/visitor present Nurse Communication: Mobility status PT Visit Diagnosis: Unsteadiness on feet (R26.81);Muscle weakness (generalized) (M62.81);Other symptoms and signs  involving the nervous system (Q73.419)     Time: 3790-2409 PT Time Calculation (min) (ACUTE ONLY): 18 min  Charges:  $Neuromuscular Re-education: 8-22 mins                     Vickki Muff, PT, DPT   Acute Rehabilitation Department   Ronnie Derby 04/04/2022, 2:46 PM

## 2022-04-04 NOTE — Consult Note (Signed)
ELECTROPHYSIOLOGY CONSULT NOTE  Patient ID: Vincent Oconnell MRN: [id number], DOB/AGE: 66-Mar-1957   Admit date: 04/01/2022 Date of Consult: 04/04/2022  Primary Physician: Patient, No Pcp Per Primary Cardiologist: none Reason for Consultation: Cryptogenic stroke ; recommendations regarding Implantable Loop Recorder  History of Present Illness Vincent Oconnell was admitted on 04/01/2022 with acute onset dizziness, R eye visual cut > R sided weakness, numbness > stroke > TNK > progressive symptoms > no hemorrhage and brought to IR > re-occluded P2/PCA and brought back to IR for a repeat intervention with one direct contact aspiration pass performed with minimal recanalization (TICI 1). Cangrelor load dose administered and a 4 x 24 mm neuroform atlas stent was deployed across the occlusion with complete recanalization.   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  he has undergone workup for stroke including echocardiogram and carotid angio.  The patient has been monitored on telemetry which has demonstrated sinus rhythm with no arrhythmias.    Neurology has deferred TEE  Echocardiogram this admission demonstrated     1. Left ventricular ejection fraction, by estimation, is 60 to 65%. The  left ventricle has normal function. The left ventricle has no regional  wall motion abnormalities. Left ventricular diastolic parameters were  normal.   2. Right ventricular systolic function is normal. The right ventricular  size is normal. Tricuspid regurgitation signal is inadequate for assessing  PA pressure.   3. The mitral valve is grossly normal. Trivial mitral valve  regurgitation. No evidence of mitral stenosis.   4. The aortic valve is tricuspid. Aortic valve regurgitation is not  visualized. No aortic stenosis is present.   5. The inferior vena cava is  normal in size with greater than 50%  respiratory variability, suggesting right atrial pressure of 3 mmHg.   Conclusion(s)/Recommendation(s): No intracardiac source of embolism  detected on this transthoracic study. Consider a transesophageal  echocardiogram to exclude cardiac source of embolism if clinically  indicated.    Lab work is reviewed.   Prior to admission, the patient denies chest pain, shortness of breath, dizziness, palpitations, or syncope.  They are recovering from their stroke with plans to home with [facility] at discharge.      No past medical history on file.   Surgical History:  Past Surgical History:  Procedure Laterality Date   IR ANGIO VERTEBRAL SEL VERTEBRAL UNI L MOD SED  04/03/2022   IR CT HEAD LTD  04/02/2022   IR CT HEAD LTD  04/02/2022   IR PERCUTANEOUS ART THROMBECTOMY/INFUSION INTRACRANIAL INC DIAG ANGIO  04/01/2022   IR PERCUTANEOUS ART THROMBECTOMY/INFUSION INTRACRANIAL INC DIAG ANGIO  04/02/2022   IR US GUIDE VASC ACCESS RIGHT  04/01/2022   IR US GUIDE VASC ACCESS RIGHT  04/02/2022   RADIOLOGY WITH ANESTHESIA N/A 04/01/2022   Procedure: RADIOLOGY WITH ANESTHESIA;  Surgeon: Radiologist, Medication, MD;  Location: [facility] OR;  Service: Radiology;  Laterality: N/A;     Medications Prior to Admission  Medication Sig Dispense Refill Last Dose   meloxicam (MOBIC) 15 MG tablet Take 15 mg by mouth daily.   Past Week   Multiple Vitamins-Minerals (MULTIVITAMIN MEN 50+) TABS Take 1 tablet by mouth daily.   Past Week    Inpatient Medications:   aspirin  81 mg Oral Daily   atorvastatin  80 mg Oral Daily   Chlorhexidine Gluconate Cloth  6 each Topical Daily  enoxaparin (LOVENOX) injection  40 mg Subcutaneous Daily   pantoprazole  40 mg Oral QHS   ticagrelor  90 mg Oral BID    Allergies:  Allergies  Allergen Reactions   Other Other (See Comments)    NO NARCOTICS - per pt's wife    Social History   Socioeconomic History   Marital status: Legally Separated     Spouse name: Not on file   Number of children: Not on file   Years of education: Not on file   Highest education level: Not on file  Occupational History   Not on file  Tobacco Use   Smoking status: Never   Smokeless tobacco: Never  Substance and Sexual Activity   Alcohol use: No   Drug use: Not on file   Sexual activity: Not on file  Other Topics Concern   Not on file  Social History Narrative   Not on file   Social Determinants of Health   Financial Resource Strain: Not on file  Food Insecurity: Not on file  Transportation Needs: Not on file  Physical Activity: Not on file  Stress: Not on file  Social Connections: Not on file  Intimate Partner Violence: Not on file     Family Hx. Sister has CAD   Review of Systems: All other systems reviewed and are otherwise negative except as noted above.  Physical Exam: Vitals:   04/04/22 0700 04/04/22 0800 04/04/22 1000 04/04/22 1100  BP: 121/76 (!) 146/85 (!) 149/89 (!) 156/87  Pulse: 73 88  78  Resp: 17 19 17 11   Temp:  98.9 F (37.2 C)    TempSrc:      SpO2: 93% 96%  94%  Weight:      Height:        GEN- The patient is well appearing, alert and oriented x 3 today.   Head- normocephalic, atraumatic Eyes-  Sclera clear, conjunctiva pink Ears- hearing intact Oropharynx- clear Neck- supple Lungs- CTA b/l, normal work of breathing Heart- RRR, no murmurs, rubs or gallops  GI- soft, NT, ND Extremities- no clubbing, cyanosis, or edema MS- no significant deformity or atrophy Skin- no rash or lesion Psych- euthymic mood, full affect   Labs:   Lab Results  Component Value Date   WBC 5.8 04/01/2022   HGB 15.6 04/01/2022   HCT 46.0 04/01/2022   MCV 90.6 04/01/2022   PLT 188 04/01/2022    Recent Labs  Lab 04/01/22 1600 04/01/22 1608  NA 142 141  K 3.8 3.7  CL 104 103  CO2 25  --   BUN 12 13  CREATININE 1.29* 1.30*  CALCIUM 9.5  --   PROT 7.1  --   BILITOT 0.9  --   ALKPHOS 52  --   ALT 19  --   AST  21  --   GLUCOSE 123* 122*   No results found for: "CKTOTAL", "CKMB", "CKMBINDEX", "TROPONINI" Lab Results  Component Value Date   CHOL 251 (H) 04/02/2022   Lab Results  Component Value Date   HDL 65 04/02/2022   Lab Results  Component Value Date   LDLCALC 128 (H) 04/02/2022   Lab Results  Component Value Date   TRIG 292 (H) 04/02/2022   Lab Results  Component Value Date   CHOLHDL 3.9 04/02/2022   No results found for: "LDLDIRECT"  No results found for: "DDIMER"   Radiology/Studies:  MR BRAIN WO CONTRAST Result Date: 04/02/2022 CLINICAL DATA:  Stroke, follow up 24 hour post  TNK stability scan (APPROX.4:30 PM on 7/18) EXAM: MRI HEAD WITHOUT CONTRAST TECHNIQUE: Multiplanar, multiecho pulse sequences of the brain and surrounding structures were obtained without intravenous contrast. COMPARISON:  CT head 04/01/2022. 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 Associated edema without significant mass effect. No midline shift. Associated petechial hemorrhage without mass occupying acute hemorrhage. No hydrocephalus, mass lesion, or extra-axial fluid collection. Vascular: Major arterial flow voids are maintained at the skull base. Skull and upper cervical spine: Normal marrow signal. Sinuses/Orbits: Clear sinuses.  No acute orbital findings. Other: No mastoid effusions. IMPRESSION: Acute left PCA territory infarcts, described above. Associated edema and petechial hemorrhage without mass occupying acute hemorrhage or significant mass effect. Electronically Signed   By: Feliberto HartsFrederick S Jones M.D.   On: 04/02/2022 16:00    CT ANGIO HEAD NECK W WO CM Result Date: 04/01/2022 CLINICAL DATA:  Code stroke EXAM: CT ANGIOGRAPHY HEAD AND NECK CT PERFUSION BRAIN TECHNIQUE: Multidetector CT imaging of the head and neck was performed using the standard protocol during bolus administration of intravenous contrast.  Multiplanar CT image reconstructions and MIPs were obtained to evaluate the vascular anatomy. Carotid stenosis measurements (when applicable) are obtained utilizing NASCET criteria, using the distal internal carotid diameter as the denominator. Multiphase CT imaging of the brain was performed following IV bolus contrast injection. Subsequent parametric perfusion maps were calculated using RAPID software. RADIATION DOSE REDUCTION: This exam was performed according to the departmental dose-optimization program which includes automated exposure control, adjustment of the mA and/or kV according to patient size and/or use of iterative reconstruction technique. CONTRAST:  100mL OMNIPAQUE IOHEXOL 350 MG/ML SOLN COMPARISON:  None Available. FINDINGS: CTA NECK FINDINGS SKELETON: There is no bony spinal canal stenosis. No lytic or blastic lesion. OTHER NECK: Normal pharynx, larynx and major salivary glands. No cervical lymphadenopathy. Unremarkable thyroid gland. UPPER CHEST: No pneumothorax or pleural effusion. No nodules or masses. AORTIC ARCH: There is no calcific atherosclerosis of the aortic arch. There is no aneurysm, dissection or hemodynamically significant stenosis of the visualized portion of the aorta. Conventional 3 vessel aortic branching pattern. The visualized proximal subclavian arteries are widely patent. RIGHT CAROTID SYSTEM: No dissection, occlusion or aneurysm. Mild atherosclerotic calcification at the carotid bifurcation without hemodynamically significant stenosis. LEFT CAROTID SYSTEM: No dissection, occlusion or aneurysm. Mild atherosclerotic calcification at the carotid bifurcation without hemodynamically significant stenosis. VERTEBRAL ARTERIES: Codominant configuration. Both origins are clearly patent. There is no dissection, occlusion or flow-limiting stenosis to the skull base (V1-V3 segments). CTA HEAD FINDINGS POSTERIOR CIRCULATION: --Vertebral arteries: Normal V4 segments. --Inferior cerebellar  arteries: Normal. --Basilar artery: Normal. --Superior cerebellar arteries: Normal. --Posterior cerebral arteries (PCA): Left PCA P2 segment remains occluded. There are fetal predominant origins of both posterior cerebral arteries. ANTERIOR CIRCULATION: --Intracranial internal carotid arteries: Normal. --Anterior cerebral arteries (ACA): Normal. Both A1 segments are present. Patent anterior communicating artery (a-comm). --Middle cerebral arteries (MCA): Normal. VENOUS SINUSES: As permitted by contrast timing, patent. ANATOMIC VARIANTS: None Review of the MIP images confirms the above findings. CT Brain Perfusion Findings: ASPECTS: 10 at 5:02 p.m. on 04/01/2022 CBF (<30%) Volume: 0mL Perfusion (Tmax>6.0s) volume: 34mL Mismatch Volume: 34mL Infarction Location:No infarction. The area of ischemia is located in the left PCA territory. IMPRESSION: 1. Occlusion of the left PCA P2 segment, with 34 mL area of ischemic penumbra within the left PCA territory. No infarct by CT perfusion analysis. 2. Bilateral carotid bifurcation atherosclerosis without hemodynamically significant stenosis by  NASCET criteria. Electronically Signed   By: Deatra Robinson M.D.   On: 04/01/2022 22:36    CT HEAD WO CONTRAST ( ) Result Date: 04/01/2022 CLINICAL DATA:  Acute neurologic deficit EXAM: CT HEAD WITHOUT CONTRAST TECHNIQUE: Contiguous axial images were obtained from the base of the skull through the vertex without intravenous contrast. RADIATION DOSE REDUCTION: This exam was performed according to the departmental dose-optimization program which includes automated exposure control, adjustment of the mA and/or kV according to patient size and/or use of iterative reconstruction technique. COMPARISON:  None Available. FINDINGS: Brain: There is no mass, hemorrhage or extra-axial collection. The size and configuration of the ventricles and extra-axial CSF spaces are normal. The brain parenchyma is normal, without acute or chronic  infarction. Vascular: No abnormal hyperdensity of the major intracranial arteries or dural venous sinuses. No intracranial atherosclerosis. Skull: The visualized skull base, calvarium and extracranial soft tissues are normal. Sinuses/Orbits: No fluid levels or advanced mucosal thickening of the visualized paranasal sinuses. No mastoid or middle ear effusion. The orbits are normal. IMPRESSION: Normal head CT. Electronically Signed   By: Deatra Robinson M.D.   On: 04/01/2022 21:06   CT HEAD CODE STROKE WO CONTRAST Result Date: 04/01/2022 CLINICAL DATA:  Right eye vision loss and slurred speech EXAM: CT ANGIOGRAPHY HEAD AND NECK TECHNIQUE: Multidetector CT imaging of the head and neck was performed using the standard protocol during bolus administration of intravenous contrast. Multiplanar CT image reconstructions and MIPs were obtained to evaluate the vascular anatomy. Carotid stenosis measurements (when applicable) are obtained utilizing NASCET criteria, using the distal internal carotid diameter as the denominator. RADIATION DOSE REDUCTION: This exam was performed according to the departmental dose-optimization program which includes automated exposure control, adjustment of the mA and/or kV according to patient size and/or use of iterative reconstruction technique. CONTRAST:  OMNIPAQUE IOHEXOL 350 MG/ML SOLN COMPARISON:  None Available. FINDINGS: CT HEAD FINDINGS Brain: There is no acute intracranial hemorrhage, extra-axial fluid collection, or acute infarct. Parenchymal volume is normal. The ventricles are normal in size. Gray-white differentiation is preserved. There is no mass lesion. There is no mass effect or midline shift. Vascular: No dense vessel is seen. Skull: Normal. Negative for fracture or focal lesion. Sinuses/Orbits: Imaged paranasal sinuses are clear. The globes and orbits are unremarkable. Other: None. ASPECTS (Alberta Stroke Program Early CT Score) - Ganglionic level infarction (caudate,  lentiform nuclei, internal capsule, insula, M1-M3 cortex): 7 - Supraganglionic infarction (M4-M6 cortex): 3 Total score (0-10 with 10 being normal): 10 CTA NECK FINDINGS The CTA images are markedly degraded by poor bolus timing. Aortic arch: The imaged aortic arch is grossly unremarkable. The origins of the major branch vessels appear patent. The subclavian arteries appear patent to the level imaged. Right carotid system: There is minimal plaque at the carotid bifurcation. There is no definite hemodynamically significant stenosis or occlusion. There is no definite dissection or aneurysm. Left carotid system: There is scattered mild soft plaque in the left common carotid artery without hemodynamically significant stenosis. There is minimal plaque at the bifurcation without definite hemodynamically significant stenosis or occlusion. There is no definite dissection or aneurysm. Vertebral arteries: The V1 and proximal V2 segments of the vertebral arteries are suboptimally evaluated. The distal V2 and V3 segments appear patent without definite hemodynamically significant stenosis or occlusion. There is no definite dissection or aneurysm. Skeleton: Other neck: Upper chest: Review of the MIP images confirms the above findings CTA HEAD FINDINGS Anterior circulation: There is no definite hemodynamically significant stenosis  or occlusion in the intracranial ICAs Bilateral M1 segments appear grossly patent. There is possible high-grade stenosis or occlusion of a left M2 segment in the sylvian fissure (18-107, 16-14). There is no definite proximal high-grade stenosis or occlusion on the right, though evaluation is markedly suboptimal due to bolus timing. There is no definite proximal high-grade stenosis or occlusion in the anterior cerebral arteries within the above confines. There is no definite aneurysm. Posterior circulation: The bilateral V4 segments and basilar artery are grossly patent without definite proximal high-grade  stenosis or occlusion. There is short-segment high-grade stenosis/occlusion of the left P2 segment (5-47, 18-114). There is apparent reconstitution, though there is a suspected additional occlusion distally (18-133). Bilateral posterior communicating arteries are identified with a fetal origin on the left. There is no definite proximal high-grade stenosis or occlusion on the right. There is no definite aneurysm. Venous sinuses: Patent. Anatomic variants: As above. Review of the MIP images confirms the above findings IMPRESSION: 1. No acute intracranial hemorrhage or infarct.  ASPECTS is 10. 2. The CTA image quality is markedly degraded by poor bolus timing with significantly suboptimal evaluation of the distal intracranial vasculature. 3. Short-segment occlusion of the left P2 segment. There is apparent reconstitution, but there is a suspected additional occlusion distally. 4. Suspected high-grade stenosis or occlusion of a left M2 segment in the sylvian fissure. 5. No other definite proximal high-grade stenosis or occlusion in the intracranial vasculature. 6. No definite hemodynamically significant stenosis or occlusion in the vasculature of the neck within the above confines. The results of the initial noncontrast head CT were called by telephone at the time of interpretation on 04/01/2022 at 4:12 pm to provider Lyanne Co NP, who verbally acknowledged these results. The CTA findings were discussed with Dr. Iver Nestle at 4:45pm. Electronically Signed   By: Lesia Hausen M.D.   On: 04/01/2022 17:02    12-lead ECG SR All prior EKG's in EPIC reviewed with no documented atrial fibrillation  Telemetry SR  Assessment and Plan:  1. Cryptogenic stroke The patient presents with cryptogenic stroke.  Dr. Lalla Brothers spoke at length with the patient about monitoring for afib with either a 30 day event monitor or an implantable loop recorder.  Risks, benefits, and alteratives to implantable loop recorder were discussed  with the patient today.   At this time, the patient is very clear in their decision to proceed with implantable loop recorder.   Wound care was reviewed with the patient (keep incision clean and dry for 3 days).  Wound check follow up will be scheduled for the patient.  Please call with questions.    Norberto Sorenson, PA-C 04/04/2022

## 2022-04-05 ENCOUNTER — Encounter (HOSPITAL_COMMUNITY): Payer: Self-pay | Admitting: Cardiology

## 2022-04-05 ENCOUNTER — Telehealth: Payer: Self-pay

## 2022-04-05 NOTE — Telephone Encounter (Signed)
Left message on patient home answering machine to call us back.  Need to review post Loop implant follow up instructions.  Device Clinic number left.  As there is no DPR, I did not leave any specific information other than to return the device clinic nurse call and our number.

## 2022-04-05 NOTE — Telephone Encounter (Signed)
-----   Message from Sheilah Pigeon, [place] sent at 04/04/2022  1:29 PM EDT ----- Loop implant today  CL

## 2022-04-05 NOTE — Telephone Encounter (Deleted)
     I attempted to reach patient at home number to review the following after care instructions following his Loop Implant on 04/04/22 by Dr. Lalla Brothers.    I left general message for patient to cll back to the Device clinic with our direct contact number but no other information given as I do not see a clear DPR on record for patient.       Care After Your Loop Recorder  You have a [employer] Assert IQ Loop Recorder   Monitor your cardiac device site for redness, swelling, and drainage. Call the device clinic at [phone] if you experience these symptoms or fever/chills.  If you notice bleeding from your site, hold firm, but gently pressure with two fingers for 5 minutes. Dried blood on the steri-strips when removing the outer bandage is normal.   Keep the large square bandage on your site for 24 hours and then you may remove it yourself. Keep the steri-strips underneath in place.   You may shower after 72 hours / 3 days from your procedure with the steri-strips in place. They will usually fall off on their own, or may be removed after 10 days. Pat dry.   Avoid lotions, ointments, or perfumes over your incision until it is well-healed.  Please do not submerge in water until your site is completely healed.   Your device is MRI compatible.   Remote monitoring is used to monitor your cardiac device from home. This monitoring is scheduled every month by our office. It allows [place] to keep an eye on the function of your device to ensure it is working properly.

## 2022-04-08 NOTE — Telephone Encounter (Signed)
Spoke with patient and patients wife voiced understanding of wound care, patients home monitor updated 04/08/22.

## 2022-04-09 ENCOUNTER — Encounter: Payer: Self-pay | Admitting: Occupational Therapy

## 2022-04-09 ENCOUNTER — Encounter: Payer: Self-pay | Admitting: Speech Pathology

## 2022-04-09 ENCOUNTER — Ambulatory Visit: Payer: Managed Care, Other (non HMO) | Admitting: Speech Pathology

## 2022-04-09 ENCOUNTER — Ambulatory Visit: Payer: Managed Care, Other (non HMO) | Attending: Nurse Practitioner | Admitting: Occupational Therapy

## 2022-04-09 ENCOUNTER — Telehealth (HOSPITAL_COMMUNITY): Payer: Self-pay

## 2022-04-09 DIAGNOSIS — I69351 Hemiplegia and hemiparesis following cerebral infarction affecting right dominant side: Secondary | ICD-10-CM | POA: Diagnosis not present

## 2022-04-09 DIAGNOSIS — I69318 Other symptoms and signs involving cognitive functions following cerebral infarction: Secondary | ICD-10-CM | POA: Diagnosis present

## 2022-04-09 DIAGNOSIS — R41841 Cognitive communication deficit: Secondary | ICD-10-CM

## 2022-04-09 DIAGNOSIS — R41842 Visuospatial deficit: Secondary | ICD-10-CM | POA: Insufficient documentation

## 2022-04-09 DIAGNOSIS — R278 Other lack of coordination: Secondary | ICD-10-CM | POA: Diagnosis present

## 2022-04-09 NOTE — Telephone Encounter (Signed)
Called pt's wife to let her know that he is not due for a f/u with Dr. Quay Burow for 6 months. Call with any questions. AW

## 2022-04-09 NOTE — Therapy (Signed)
OUTPATIENT SPEECH LANGUAGE PATHOLOGY EVALUATION   Patient Name: Vincent Oconnell MRN: [id number] DOB:Nov 29, 1955, 66 y.o., male Today's Date: 04/09/2022  PCP: Not listed REFERRING PROVIDER: Mathews Argyle, NP   End of Session - 04/09/22 1320     Visit Number 1             History reviewed. No pertinent past medical history. Past Surgical History:  Procedure Laterality Date   IR ANGIO VERTEBRAL SEL VERTEBRAL UNI L MOD SED  04/03/2022   IR CT HEAD LTD  04/02/2022   IR CT HEAD LTD  04/02/2022   IR PERCUTANEOUS ART THROMBECTOMY/INFUSION INTRACRANIAL INC DIAG ANGIO  04/01/2022   IR PERCUTANEOUS ART THROMBECTOMY/INFUSION INTRACRANIAL INC DIAG ANGIO  04/02/2022   IR US GUIDE VASC ACCESS RIGHT  04/01/2022   IR US GUIDE VASC ACCESS RIGHT  04/02/2022   LOOP RECORDER INSERTION N/A 04/04/2022   Procedure: LOOP RECORDER INSERTION;  Surgeon: Lanier Prude, MD;  Location: [facility];  Service: Cardiovascular;  Laterality: N/A;   RADIOLOGY WITH ANESTHESIA N/A 04/01/2022   Procedure: RADIOLOGY WITH ANESTHESIA;  Surgeon: Radiologist, Medication, MD;  Location: [facility];  Service: Radiology;  Laterality: N/A;   Patient Active Problem List   Diagnosis Date Noted   Essential hypertension 04/04/2022   Hyperlipidemia 04/04/2022   Acute ischemic left PCA stroke (HCC) 04/01/2022    ONSET DATE: 04/01/22   REFERRING DIAG: I63.9 (ICD-10-CM) - Cerebrovascular accident (CVA), unspecified mechanism (HCC)  THERAPY DIAG:  Cognitive communication deficit  Rationale for Evaluation and Treatment Rehabilitation  SUBJECTIVE:   SUBJECTIVE STATEMENT: Pt was pleasant and cooperative throughout assessment.   Pt accompanied by: self  PERTINENT HISTORY: Left PCA stroke, Hypertension, Hyperlidemia, R homonymous hemianopia  PAIN:  Are you having pain? No and "I've got an abnormality"   FALLS: Has patient fallen in last 6 months?  No  LIVING ENVIRONMENT: Lives with: lives with their spouse Lives  in: House/apartment  PLOF:  Level of assistance: Independent with ADLs, Independent with IADLs Employment: Full-time employment   PATIENT GOALS Could not identify any goals   OBJECTIVE:   DIAGNOSTIC FINDINGS:   FINDINGS: Brain: Acute left PCA territory infarcts involving the left thalamus, hippocampus, medial left temporal lobe and occipital lobe. Associated edema without significant mass effect. No midline shift. Associated petechial hemorrhage without mass occupying acute hemorrhage. No hydrocephalus, mass lesion, or extra-axial fluid collection.  Electronically Signed   By: Feliberto Harts M.D.   On: 04/02/2022 16:00    COGNITION: Overall cognitive status: Impaired Areas of impairment:  Attention: Impaired: Alternating, Divided Memory: Impaired: Working Teacher, music term Awareness: Impaired: Emergent Executive function: Impaired: Organization, Planning, Error awareness, and Self-correction Functional deficits: pt unable to identify any functional areas of need due to limited awareness of deficits  COGNITIVE COMMUNICATION Following directions: Follows multi-step commands consistently  Auditory comprehension: WFL Verbal expression: WFL Functional communication: Impaired: Appears to have difficulty remaining on topic, requires repetition of auditory information  ORAL MOTOR EXAMINATION Overall status: WFL Comments:   STANDARDIZED ASSESSMENTS: CLQT initiated. Will complete next session.     Personal Facts: 7/8 Symbol Cancellation: 11/12 ; pt self corrected 5 errors Confrontation Naming Clock Drawing: 12//13 Story Retelling: 5/10 Symbol Trails: 5/10 Generative Naming: 5/9 Design Memory Mazes: 4/8 Design Generation     PATIENT REPORTED OUTCOME MEASURES (PROM): NA    PATIENT EDUCATION: Education details: Cog comm impairment Person educated: Patient Education method: Explanation Education comprehension: verbalized understanding and needs further  education     GOALS:  Goals reviewed with patient? Yes  SHORT TERM GOALS: Target date: 05/08/2022     Pt will increase auditory comprehension by recalling and verbalizing strategies to assist with active listening during conversation with minA. Baseline: Goal status: INITIAL   2.  Pt will comprehend functional memory or visual aids for recall of important information during structured conversations with minA. Baseline:  Goal status: INITIAL    LONG TERM GOALS: Target date: 06/05/2022     Pt will comprehend functional memory or visual aids for recall of important information during unstructured conversations independently. Baseline:  Goal status: INITIAL   2.  Pt will increase auditory comprehension by recalling and verbalizing strategies to assist with active listening during unstructured conversation independently. Baseline:  Goal status: INITIAL  ASSESSMENT:  CLINICAL IMPRESSION: Pt is a 66 yo male who presents to ST OP for evaluation post CVA. Pt does not endorse any deficits at this time. Pt was assessed using CLQT - see above for score details. Will continue with assessment next session. SLP observed attention difficulties throughout assessment which appeared to impact comprehension of information. Pt benefited from repetition of information to increase understanding.  Pt demonstrated relative strengths in expressive discourse and relative weaknesses in attention, memory, and comprehension. SLP rec skilled ST services to address cognitive-communication impairment to maximize functional communication and increase pt ability to return to work.   OBJECTIVE IMPAIRMENTS include attention, memory, awareness, and executive functioning. These impairments are limiting patient from return to work and effectively communicating at home and in community. Factors affecting potential to achieve goals and functional outcome are  NA .Marland Kitchen Patient will benefit from skilled SLP services to address  above impairments and improve overall function.  REHAB POTENTIAL: Good  PLAN: SLP FREQUENCY: 1-2x/week  SLP DURATION: 8 weeks  PLANNED INTERVENTIONS: Environmental controls, Cueing hierachy, Cognitive reorganization, Internal/external aids, Functional tasks, SLP instruction and feedback, Compensatory strategies, and Patient/family education    [place], CCC-SLP 04/09/2022, 1:24 PM

## 2022-04-09 NOTE — Therapy (Signed)
OUTPATIENT OCCUPATIONAL THERAPY NEURO EVALUATION  Patient Name: Vincent Oconnell MRN: [id number] DOB:1955-09-20, 66 y.o., male Today's Date: 04/09/2022  PCP: Not on file REFERRING PROVIDER: Mathews Argyle, NP    OT End of Session - 04/09/22 1232     Visit Number 1    Date for OT Re-Evaluation 07/08/2022   Authorization Type [employer]   Authorization Time Period VL: 30 combined OT/ST   Authorization - Visit Number 2   Authorization - Number of Visits 30   OT Start Time 1230   OT Stop Time 1317   OT Time Calculation (min) 47 min         No past medical history on file.  Past Surgical History:  Procedure Laterality Date   IR ANGIO VERTEBRAL SEL VERTEBRAL UNI L MOD SED  04/03/2022   IR CT HEAD LTD  04/02/2022   IR CT HEAD LTD  04/02/2022   IR PERCUTANEOUS ART THROMBECTOMY/INFUSION INTRACRANIAL INC DIAG ANGIO  04/01/2022   IR PERCUTANEOUS ART THROMBECTOMY/INFUSION INTRACRANIAL INC DIAG ANGIO  04/02/2022   IR US GUIDE VASC ACCESS RIGHT  04/01/2022   IR US GUIDE VASC ACCESS RIGHT  04/02/2022   LOOP RECORDER INSERTION N/A 04/04/2022   Procedure: LOOP RECORDER INSERTION;  Surgeon: Lanier Prude, MD;  Location: [facility];  Service: Cardiovascular;  Laterality: N/A;   RADIOLOGY WITH ANESTHESIA N/A 04/01/2022   Procedure: RADIOLOGY WITH ANESTHESIA;  Surgeon: Radiologist, Medication, MD;  Location: [facility];  Service: Radiology;  Laterality: N/A;   Patient Active Problem List   Diagnosis Date Noted   Essential hypertension 04/04/2022   Hyperlipidemia 04/04/2022   Acute ischemic left PCA stroke (HCC) 04/01/2022    ONSET DATE: 04/01/22  REFERRING DIAG: N46.270 (ICD-10-CM) - Acute ischemic left PCA stroke (HCC)   THERAPY DIAG:  Hemiplegia and hemiparesis following cerebral infarction affecting right dominant side (HCC)  Other lack of coordination  Visuospatial deficit  Other symptoms and signs involving cognitive functions following cerebral infarction  Rationale for  Evaluation and Treatment Rehabilitation  SUBJECTIVE:   SUBJECTIVE STATEMENT: "I'm anxious because I've never been through anything like this before." Pt reports he has always been very active and enjoys working out. Also states he has been very careful since being home and wants to do everything he can to heal and return to [facility] and his work as a Investment banker, operational. Pt accompanied by: self  PERTINENT HISTORY: Acute ischemic left PCA stroke due to left P2 occlusion of cryptogenic etiology s/p mechanical thrombectomy x2 needing rescue left PCA stent; loop recorder insertion 04/04/22; PMH includes h/o rotator cuff sx L side 2 years ago; R side > 5 years ago  PRECAUTIONS: No driving  WEIGHT BEARING RESTRICTIONS No  PAIN: Are you having pain? No  FALLS: Has patient fallen in last 6 months? No  LIVING ENVIRONMENT: Lives with: lives with their spouse Lives in: House/apartment Stairs: No Has following equipment at home: None  PLOF: Independent and Vocation/Vocational requirements: Investment banker, operational at [employer]; working on [employer] leave  PATIENT GOALS: Return to [employer] as safely as possible  OBJECTIVE:   HAND DOMINANCE: Right  ADLs: Overall ADLs: Mod Ind-Independent w/ most BADLs, per pt report  IADLs: Shopping: Relies on spouse Light housekeeping: Able to complete w/ extended time Meal Prep: Able to complete simple prep tasks Community mobility: No driving, per medical restrictions Medication management: Independent Handwriting:  to be assessed  MOBILITY STATUS:  Ambulated in/out of session w/out difficulty  FUNCTIONAL OUTCOME MEASURES: Physical Performance Test  Items 1 and 2 to be assessed  UPPER EXTREMITY ROM: BUE AROM WFL (shoulders, elbows, forearm, wrist, and hand)  UPPER EXTREMITY MMT:    Not assessed due to medical restrictions  HAND FUNCTION: Grip strength not assessed due to medical restrictions  COORDINATION: 9 Hole Peg test: Right: 41.93 sec; Left: 27.47  sec  SENSATION: Reports numbness along RUE and R side  COGNITION: Overall cognitive status: Within functional limits for tasks assessed  VISION: Subjective report: Reports he cannot see objects on his R side until they are right in front of him Baseline vision: Wears glasses for reading only Visual history:  no significant history  VISION ASSESSMENT: Tracking/Visual pursuits: Able to track stimulus in all quads without difficulty Saccades: decreased speed of saccadic movements Convergence: WFL Visual Fields: Right homonymous hemianopsia  PERCEPTION: WFL  PRAXIS: WFL   TODAY'S TREATMENT: None   PATIENT EDUCATION: Educated on role and purpose of OT as well as potential interventions and goals for therapy based on initial evaluation findings. Provided condition-specific education, particularly including neuro reed and typical recovery patterns, importance of secondary stroke prevention, and recognition of stroke signs and symptoms ('BEFAST' acronym) Person educated: Patient Education method: Explanation Education comprehension: verbalized understanding   HOME EXERCISE PROGRAM: To be administered   GOALS: Goals reviewed with patient? No  SHORT TERM GOALS: Target date: 05/17/22    Status:  1 Pt will demonstrate understanding of at least 2 visual perceptual compensatory strategies to incorporate into IADLs prn  Initial  2 Pt will demonstrate understanding of HEP designed for RUE GM and [facility] for improved participation in meal prep and work-related tasks  Initial    LONG TERM GOALS: Target date: 07/08/22    Status:  1 Pt will improve efficiency during functional fine motor tasks by improving time to complete 9-HPT w/ RUE by at least 8 sec  Baseline: Right: 41.93 sec; Left: 27.47 sec Initial  2 Pt will improve accuracy and overall participation in meal prep tasks as evidenced by completing PPT: Item 2 in 10 sec or less in preparation for return to work Baseline: TBA  Initial  3 Pt will improve grip strength in R hand to at least 20 lbs for functional use in IADLs  Baseline: TBA Initial  4 Pt will perform physical and cognitive tasks simultaneously for at least 5 min with no errors to facilitate safety and independence w/ hopeful return to work as a Investment banker, operational  Initial    ASSESSMENT:  CLINICAL IMPRESSION: Pt is a 66 y/o who presents to OP OT due to RUE weakness and visual field deficits s/p L PCA CVA on 04/01/22. 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 Evaluation today indicated decreased RUE FMC, coordination, and strength. Pt will benefit from skilled occupational therapy services to address these deficits, as well as altered sensation, balance, dual tasking, visual perception, condition-specific education, introduction of compensatory strategies/AE prn, and implementation of an HEP to improve participation and safety during IADLs and facilitate hopeful return to work.  PERFORMANCE DEFICITS in functional skills including coordination, dexterity, sensation, strength, FMC, GMC, mobility, balance, and UE functional use, cognitive skills including attention and memory.  IMPAIRMENTS are limiting patient from IADLs, work, and leisure.   COMORBIDITIES has no other co-morbidities that affects occupational performance. Patient will benefit from skilled OT to address above impairments and improve overall function.  MODIFICATION OR ASSISTANCE TO COMPLETE EVALUATION: No modification of tasks or assist  necessary to complete an evaluation.  OT OCCUPATIONAL PROFILE AND HISTORY: Problem focused assessment: Including review of records relating to presenting problem.  CLINICAL DECISION MAKING: Moderate - several treatment options, min-mod task modification necessary  REHAB POTENTIAL: Excellent  EVALUATION COMPLEXITY: Low   PLAN: OT FREQUENCY: 1x/week (decreased frequency due to insurance-based VL)  OT  DURATION: 12 weeks  PLANNED INTERVENTIONS: self care/ADL training, therapeutic exercise, therapeutic activity, neuromuscular re-education, functional mobility training, moist heat, cryotherapy, patient/family education, cognitive remediation/compensation, visual/perceptual remediation/compensation, and DME and/or AE instructions  RECOMMENDED OTHER SERVICES: Currently has order for OP PT and ST services  CONSULTED AND AGREED WITH PLAN OF CARE: Patient  PLAN FOR NEXT SESSION: Introduce visual compensatory strategies and coordination activities targeting [facility]   Rosie Fate, MSOT, OTR/L 04/09/2022, 12:33 PM

## 2022-04-11 ENCOUNTER — Telehealth (HOSPITAL_COMMUNITY): Payer: Self-pay

## 2022-04-11 NOTE — Telephone Encounter (Signed)
Called pt's wife to let her know that paperwork would not be ready until tomorrow. Victorino Dike will give them a call when it is ready to pick up. No answer, left vm. AW

## 2022-04-16 ENCOUNTER — Encounter: Payer: Self-pay | Admitting: Occupational Therapy

## 2022-04-16 ENCOUNTER — Encounter: Payer: Self-pay | Admitting: Speech Pathology

## 2022-04-16 ENCOUNTER — Ambulatory Visit: Payer: Managed Care, Other (non HMO) | Admitting: Occupational Therapy

## 2022-04-16 ENCOUNTER — Ambulatory Visit: Payer: Managed Care, Other (non HMO) | Attending: Nurse Practitioner | Admitting: Physical Therapy

## 2022-04-16 ENCOUNTER — Ambulatory Visit: Payer: Managed Care, Other (non HMO) | Admitting: Speech Pathology

## 2022-04-16 ENCOUNTER — Encounter: Payer: Self-pay | Admitting: Physical Therapy

## 2022-04-16 DIAGNOSIS — R41842 Visuospatial deficit: Secondary | ICD-10-CM

## 2022-04-16 DIAGNOSIS — I69351 Hemiplegia and hemiparesis following cerebral infarction affecting right dominant side: Secondary | ICD-10-CM | POA: Insufficient documentation

## 2022-04-16 DIAGNOSIS — I69318 Other symptoms and signs involving cognitive functions following cerebral infarction: Secondary | ICD-10-CM | POA: Insufficient documentation

## 2022-04-16 DIAGNOSIS — I63532 Cerebral infarction due to unspecified occlusion or stenosis of left posterior cerebral artery: Secondary | ICD-10-CM | POA: Diagnosis not present

## 2022-04-16 DIAGNOSIS — R41841 Cognitive communication deficit: Secondary | ICD-10-CM | POA: Diagnosis present

## 2022-04-16 DIAGNOSIS — R278 Other lack of coordination: Secondary | ICD-10-CM | POA: Diagnosis present

## 2022-04-16 NOTE — Therapy (Signed)
OUTPATIENT SPEECH LANGUAGE PATHOLOGY TREATMENT NOTE   Patient Name: Vincent Oconnell MRN: [id number] DOB:[birth year], 66 y.o., male Today's Date: 04/16/2022  PCP: Not listed REFERRING PROVIDER: Mathews Argyle, NP  END OF SESSION:   End of Session - 04/16/22 1313     Visit Number 2    Number of Visits 17    Date for SLP Re-Evaluation 06/11/22    SLP Start Time 1314    SLP Stop Time  1355    SLP Time Calculation (min) 41 min    Activity Tolerance Patient tolerated treatment well             History reviewed. No pertinent past medical history. Past Surgical History:  Procedure Laterality Date   IR ANGIO VERTEBRAL SEL VERTEBRAL UNI L MOD SED  04/03/2022   IR CT HEAD LTD  04/02/2022   IR CT HEAD LTD  04/02/2022   IR PERCUTANEOUS ART THROMBECTOMY/INFUSION INTRACRANIAL INC DIAG ANGIO  04/01/2022   IR PERCUTANEOUS ART THROMBECTOMY/INFUSION INTRACRANIAL INC DIAG ANGIO  04/02/2022   IR US GUIDE VASC ACCESS RIGHT  04/01/2022   IR US GUIDE VASC ACCESS RIGHT  04/02/2022   LOOP RECORDER INSERTION N/A 04/04/2022   Procedure: LOOP RECORDER INSERTION;  Surgeon: Lanier Prude, MD;  [facility]: [facility];  Service: Cardiovascular;  Laterality: N/A;   RADIOLOGY WITH ANESTHESIA N/A 04/01/2022   Procedure: RADIOLOGY WITH ANESTHESIA;  Surgeon: Radiologist, Medication, MD;  Location: [facility];  Service: Radiology;  Laterality: N/A;   Patient Active Problem List   Diagnosis Date Noted   Essential hypertension 04/04/2022   Hyperlipidemia 04/04/2022   Acute ischemic left PCA stroke (HCC) 04/01/2022    ONSET DATE: 04/01/22  REFERRING DIAG: I63.9 (ICD-10-CM) - Cerebrovascular accident (CVA), unspecified mechanism (HCC)  THERAPY DIAG:  Cognitive communication deficit  Rationale for Evaluation and Treatment Rehabilitation  SUBJECTIVE: "I'm doing good."  PAIN:  Are you having pain? No     OBJECTIVE:   TODAY'S TREATMENT: Completed CLQT this session and provided cog-comm handout for edu  on diagnosis.    Personal Facts: 7/8 Symbol Cancellation: 11/12 ; pt self corrected 5 errors Confrontation Naming: 10/10 Clock Drawing: 12//13 Story Retelling: 5/10 Symbol Trails: 5/10 Generative Naming: 5/9 Design Memory 6/6 Mazes: 4/8 Design Generation 5/13   OVERALL SCORE: MILD  Attention, Memory, Exec Function, Language, Visuospatial (ALL MILD)        PATIENT EDUCATION: Education details: Cog comm impairment Person educated: Patient Education method: Explanation Education comprehension: verbalized understanding and needs further education         GOALS: Goals reviewed with patient? Yes   SHORT TERM GOALS: Target date: 05/08/2022     Pt will increase auditory comprehension by recalling and verbalizing strategies to assist with active listening during conversation with minA. Baseline: Goal status: INITIAL   2.  Pt will comprehend functional memory or visual aids for recall of important information during structured conversations with minA. Baseline:  Goal status: INITIAL     LONG TERM GOALS: Target date: 06/05/2022       Pt will comprehend functional memory or visual aids for recall of important information during unstructured conversations independently. Baseline:  Goal status: INITIAL   2.  Pt will increase auditory comprehension by recalling and verbalizing strategies to assist with active listening during unstructured conversation independently. Baseline:  Goal status: INITIAL   ASSESSMENT:   CLINICAL IMPRESSION: See tx note. Answered all pt questions today. Cont with current POC.  SLP rec skilled ST services to  address cognitive-communication impairment to maximize functional communication and increase pt ability to return to work.    OBJECTIVE IMPAIRMENTS include attention, memory, awareness, and executive functioning. These impairments are limiting patient from return to work and effectively communicating at home and in community. Factors affecting  potential to achieve goals and functional outcome are  NA .Marland Kitchen Patient will benefit from skilled SLP services to address above impairments and improve overall function.   REHAB POTENTIAL: Good   PLAN: SLP FREQUENCY: 1-2x/week   SLP DURATION: 8 weeks   PLANNED INTERVENTIONS: Environmental controls, Cueing hierachy, Cognitive reorganization, Internal/external aids, Functional tasks, SLP instruction and feedback, Compensatory strategies, and Patient/family education    [place], CCC-SLP 04/16/2022, 1:14 PM

## 2022-04-16 NOTE — Therapy (Signed)
OUTPATIENT PHYSICAL THERAPY NEURO EVALUATION   Patient Name: Vincent Oconnell MRN: [id number] DOB:04-14-1956, 66 y.o., male Today's Date: 04/16/2022   PCP: None REFERRING PROVIDER: August Albino, NP    PT End of Session - 04/16/22 1410     Visit Number 1    PT Start Time [id number]    PT Stop Time 1310    PT Time Calculation (min) 46 min    Activity Tolerance Patient tolerated treatment well    Behavior During Therapy [facility] for tasks assessed/performed             History reviewed. No pertinent past medical history. Past Surgical History:  Procedure Laterality Date   IR ANGIO VERTEBRAL SEL VERTEBRAL UNI L MOD SED  04/03/2022   IR CT HEAD LTD  04/02/2022   IR CT HEAD LTD  04/02/2022   IR PERCUTANEOUS ART THROMBECTOMY/INFUSION INTRACRANIAL INC DIAG ANGIO  04/01/2022   IR PERCUTANEOUS ART THROMBECTOMY/INFUSION INTRACRANIAL INC DIAG ANGIO  04/02/2022   IR US GUIDE VASC ACCESS RIGHT  04/01/2022   IR US GUIDE VASC ACCESS RIGHT  04/02/2022   LOOP RECORDER INSERTION N/A 04/04/2022   Procedure: LOOP RECORDER INSERTION;  Surgeon: Vickie Epley, MD;  Location: [facility];  Service: Cardiovascular;  Laterality: N/A;   RADIOLOGY WITH ANESTHESIA N/A 04/01/2022   Procedure: RADIOLOGY WITH ANESTHESIA;  Surgeon: Radiologist, Medication, MD;  Location: [facility];  Service: Radiology;  Laterality: N/A;   Patient Active Problem List   Diagnosis Date Noted   Essential hypertension 04/04/2022   Hyperlipidemia 04/04/2022   Acute ischemic left PCA stroke ([place]) 04/01/2022    ONSET DATE: 04/04/2022   REFERRING DIAG: Diagnosis I63.532 (ICD-10-CM) - Acute ischemic left PCA stroke (HCC)   THERAPY DIAG:  Hemiplegia and hemiparesis following cerebral infarction affecting right dominant side ([place])  Other lack of coordination  Rationale for Evaluation and Treatment Rehabilitation  SUBJECTIVE:                                                                                                                                                                                               SUBJECTIVE STATEMENT: Patient reports he was working out when he had a stroke. His RUE is completely numb. He can move it and denies pain. He has a R homonymous hemianopsia and some R facial numbness as well.   Pt accompanied by: Vincent Oconnell  PERTINENT HISTORY: 66 y/o male presented to ED on 04/01/22 for R sided vision loss and dizziness. TNK given on 7/17 after CT head was negative with symptoms worsening. CTA showed L PCA occlusion. S/p mechanical thrombectomy with stent  placement on 7/18. MRI showed acute L PCA infarcts. No significant PMH.   PAIN:  Are you having pain? No  PRECAUTIONS: None  WEIGHT BEARING RESTRICTIONS No  FALLS: Has patient fallen in last 6 months? 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  OBJECTIVE:   DIAGNOSTIC FINDINGS: MRI IMPRESSION:  Acute left PCA territory infarcts, described above. Associated edema  and petechial hemorrhage without mass occupying acute hemorrhage or  significant mass effect.   COMPARISON:  CT head 04/01/2022.   FINDINGS:  Brain: Acute left PCA territory infarcts involving the left  thalamus, hippocampus, medial left temporal lobe and occipital lobe.  Associated edema without significant mass effect. No midline shift.  Associated petechial hemorrhage without mass occupying acute  hemorrhage. No hydrocephalus, mass lesion, or extra-axial fluid  collection.   COGNITION: Overall cognitive status: Within functional limits for tasks assessed   SENSATION: Not tested Patient reports numbness of RUE and face.  COORDINATION: WNL- mini tramp jumps aout and back and tandem, with good control and speed.  EDEMA:  None   MUSCLE LENGTH: Hamstrings: Right 80 deg; Left 80 deg Thomas test: Right WNL deg; Left WNL deg   POSTURE: No Significant postural limitations  LOWER EXTREMITY ROM:   WNL in all joints and all planes of movement   LOWER EXTREMITY MMT:    MMT Right Eval Left Eval  Hip flexion 4 5  Hip extension 4+ 5  Hip abduction 5 5  Hip adduction    Hip internal rotation    Hip external rotation    Knee flexion 5 5  Knee extension 5 5  Ankle dorsiflexion 5 5  Ankle plantarflexion    Ankle inversion    Ankle eversion    (Blank rows = not tested)  BED MOBILITY: I   TRANSFERS: I  STAIRS:  Level of Assistance: Complete Independence  Stair Negotiation Technique: Alternating Pattern  with No Rails   GAIT: Gait pattern, speed, distances all WNL   FUNCTIONAL TESTs:  5 times sit to stand: 10 Timed up and go (TUG): 7.0, dual task 7.9, Holding glass of water 8.7 Berg Balance Scale: 56 MCTSIB: Condition 1: Avg of 3 trials: N/T sec, Condition 2: Avg of 3 trials: N/T sec, Condition 3: Avg of 3 trials: N/T sec, Condition 4: Avg of 3 trials: N/T sec, and Total Score: N/T/120   TODAY'S TREATMENT:  Patient education   PATIENT EDUCATION: Education details: Evaluation results, no identified PT needs, encouraged slow return to exercising with caution about exerting too much effort or using weights. Person educated: Patient Education method: Explanation Education comprehension: verbalized understanding   HOME EXERCISE PROGRAM: N/A    GOALS: N/A   ASSESSMENT:  CLINICAL IMPRESSION: Patient is a 66 y.o. who was seen today for physical therapy evaluation and treatment for L PCA CVA. He demonstrated return to his baseline for all functional testing, with no skilled PT needs identified. Evaluation only.   OBJECTIVE IMPAIRMENTS N/A.   ACTIVITY LIMITATIONS N/A  PARTICIPATION LIMITATIONS: N/A  PERSONAL FACTORS N/A   REHAB POTENTIAL: Good  CLINICAL DECISION MAKING:  Stable/uncomplicated  EVALUATION COMPLEXITY: Low  PLAN: PT FREQUENCY: 1v  PT DURATION: 1v  PLANNED INTERVENTIONS: N/A  PLAN FOR NEXT SESSION: N/A- OT and ST are following him.   [name], DPT 04/16/2022,  2:12 PM

## 2022-04-20 NOTE — Therapy (Signed)
OUTPATIENT OCCUPATIONAL THERAPY TREATMENT SESSION  Patient Name: Vincent Oconnell MRN: [id number] DOB:[birth year], 66 y.o., male Today's Date: 04/20/2022  PCP: Not on file REFERRING PROVIDER: Mathews Argyle, NP    OT End of Session - 04/16/22 1553     Visit Number 2   Date for OT Re-Evaluation 07/08/2022   Authorization Type [employer]   Authorization Time Period VL: 30 combined OT/ST   Authorization - Visit Number 4   Authorization - Number of Visits 30   OT Start Time 1532   OT Stop Time 1615   OT Time Calculation (min) 43 min   Activity Tolerance Patient tolerated treatment well   Behavior During Therapy [facility] for tasks assessed/performed      History reviewed. No pertinent past medical history.  Past Surgical History:  Procedure Laterality Date   IR ANGIO VERTEBRAL SEL VERTEBRAL UNI L MOD SED  04/03/2022   IR CT HEAD LTD  04/02/2022   IR CT HEAD LTD  04/02/2022   IR PERCUTANEOUS ART THROMBECTOMY/INFUSION INTRACRANIAL INC DIAG ANGIO  04/01/2022   IR PERCUTANEOUS ART THROMBECTOMY/INFUSION INTRACRANIAL INC DIAG ANGIO  04/02/2022   IR US GUIDE VASC ACCESS RIGHT  04/01/2022   IR US GUIDE VASC ACCESS RIGHT  04/02/2022   LOOP RECORDER INSERTION N/A 04/04/2022   Procedure: LOOP RECORDER INSERTION;  Surgeon: Lanier Prude, MD;  [facility]: [facility];  Service: Cardiovascular;  Laterality: N/A;   RADIOLOGY WITH ANESTHESIA N/A 04/01/2022   Procedure: RADIOLOGY WITH ANESTHESIA;  Surgeon: Radiologist, Medication, MD;  Location: [facility];  Service: Radiology;  Laterality: N/A;   Patient Active Problem List   Diagnosis Date Noted   Essential hypertension 04/04/2022   Hyperlipidemia 04/04/2022   Acute ischemic left PCA stroke (HCC) 04/01/2022    ONSET DATE: 04/01/22  REFERRING DIAG: F57.322 (ICD-10-CM) - Acute ischemic left PCA stroke (HCC)   THERAPY DIAG:  Hemiplegia and hemiparesis following cerebral infarction affecting right dominant side (HCC)  Other lack of  coordination  Visuospatial deficit  Other symptoms and signs involving cognitive functions following cerebral infarction  Rationale for Evaluation and Treatment Rehabilitation  SUBJECTIVE:   SUBJECTIVE STATEMENT: Pt inquired about which MD he needs to ask to get cleared to return to driving Pt accompanied by: self  PERTINENT HISTORY: Acute ischemic left PCA stroke due to left P2 occlusion of cryptogenic etiology s/p mechanical thrombectomy x2 needing rescue left PCA stent; loop recorder insertion 04/04/22; PMH includes h/o rotator cuff sx L side 2 years ago; R side > 5 years ago  PRECAUTIONS: No driving  PAIN: Are you having pain? 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  PATIENT GOALS: Return to [employer] as safely as possible  OBJECTIVE:   HAND DOMINANCE: Right   TODAY'S TREATMENT:  Condition-Specific Education Continued condition-specific education, particularly including neuro reed and typical recovery patterns, benefits of repetitive engagement in tasks at just right challenge level to facilitate motor learning, introduction of visual compensatory strategies, and education regarding potential return to driving, emphasizing importance of receiving medical clearance from physician prior to attempting and follow up w/ ophthalmology  Putty Exercises Gross grasp of putty w/ R hand, focusing on attempting movement pattern against resistance opposed to straining to force movement; completed 25x w/ red, med-soft putty. Pt also instructed to focus on using R hand only to rotate putty in-hand between reps.   Thumb-to-finger opposition completed 5x along length of rolled red, med-soft putty w/ R hand; pt  demo'd appropriate pad-to-pad prehension     PATIENT EDUCATION: Ongoing condition-specific education related to therapeutic interventions completed this session; see treatment section above Person  educated: Patient Education method: Explanation Education comprehension: verbalized understanding   HOME EXERCISE PROGRAM: To be administered   GOALS: Goals reviewed with patient? No  SHORT TERM GOALS: Target date: 05/17/22    Status:  1 Pt will demonstrate understanding of at least 2 visual perceptual compensatory strategies to incorporate into IADLs prn  Progressing  2 Pt will demonstrate understanding of HEP designed for RUE GM and [facility] for improved participation in meal prep and work-related tasks  Progressing    LONG TERM GOALS: Target date: 07/08/22    Status:  1 Pt will improve efficiency during functional fine motor tasks by improving time to complete 9-HPT w/ RUE by at least 8 sec  Baseline: Right: 41.93 sec; Left: 27.47 sec Progressing  2 Pt will improve accuracy and overall participation in meal prep tasks as evidenced by completing PPT: Item 2 in 10 sec or less in preparation for return to work Baseline: not assessed at evaluation due to medical restrictions Progressing  3 Pt will improve grip strength in R hand to at least 20 lbs for functional use in IADLs  Baseline: not assessed at evaluation due to medical restrictions Progressing  4 Pt will perform physical and cognitive tasks simultaneously for at least 5 min with no errors to facilitate safety and independence w/ 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 OT reviewed all goals w/ pt who is agreeable to POC at this time. Session today w/ focus on patient education, largely based on pt questions and concerns w/ potential return to driving; OT facilitated thorough discussion related to condition-specific education and follow up w/ appropriate physicians w/ pt verbalizing understanding. OT then introduced putty exercises into initial HEP to address R hand strength, somatosensory input, and  FMC and coordination w/ pt able to return demo of exercises w/out difficulty.  PERFORMANCE DEFICITS in functional skills including coordination, dexterity, sensation, strength, FMC, GMC, mobility, balance, and UE functional use, cognitive skills including attention and memory.  IMPAIRMENTS are limiting patient from IADLs, work, and leisure.   COMORBIDITIES has no other co-morbidities that affects occupational performance. Patient will benefit from skilled OT to address above impairments and improve overall function.   PLAN: OT FREQUENCY: 1x/week (decreased frequency due to insurance-based VL)  OT DURATION: 12 weeks  PLANNED INTERVENTIONS: self care/ADL training, therapeutic exercise, therapeutic activity, neuromuscular re-education, functional mobility training, moist heat, cryotherapy, patient/family education, cognitive remediation/compensation, visual/perceptual remediation/compensation, and DME and/or AE instructions  RECOMMENDED OTHER SERVICES: Receiving PT and ST services at this location  CONSULTED AND AGREED WITH PLAN OF CARE: Patient  PLAN FOR NEXT SESSION: Assess PPT: Items 1 and 2; introduce visual compensatory strategies and coordination activities targeting [facility]   Rosie Fate, MSOT, OTR/L 04/16/2022, 4:43 PM

## 2022-04-22 ENCOUNTER — Ambulatory Visit: Payer: Managed Care, Other (non HMO) | Admitting: Occupational Therapy

## 2022-04-22 ENCOUNTER — Ambulatory Visit: Payer: Managed Care, Other (non HMO) | Admitting: Speech Pathology

## 2022-04-25 ENCOUNTER — Encounter: Payer: Self-pay | Admitting: Occupational Therapy

## 2022-04-25 ENCOUNTER — Encounter: Payer: Self-pay | Admitting: Speech Pathology

## 2022-04-25 ENCOUNTER — Ambulatory Visit: Payer: Managed Care, Other (non HMO) | Admitting: Speech Pathology

## 2022-04-25 ENCOUNTER — Ambulatory Visit: Payer: Managed Care, Other (non HMO) | Admitting: Occupational Therapy

## 2022-04-25 DIAGNOSIS — I69318 Other symptoms and signs involving cognitive functions following cerebral infarction: Secondary | ICD-10-CM

## 2022-04-25 DIAGNOSIS — R41842 Visuospatial deficit: Secondary | ICD-10-CM

## 2022-04-25 DIAGNOSIS — R41841 Cognitive communication deficit: Secondary | ICD-10-CM

## 2022-04-25 DIAGNOSIS — I69351 Hemiplegia and hemiparesis following cerebral infarction affecting right dominant side: Secondary | ICD-10-CM | POA: Diagnosis not present

## 2022-04-25 DIAGNOSIS — R278 Other lack of coordination: Secondary | ICD-10-CM

## 2022-04-25 NOTE — Therapy (Signed)
OUTPATIENT OCCUPATIONAL THERAPY TREATMENT SESSION  Patient Name: Vincent Oconnell MRN: [id number] DOB:Nov 16, 1955, 66 y.o., male Today's Date: 04/25/2022  PCP: Not on file REFERRING PROVIDER: Mathews Argyle, NP    OT End of Session - 04/25/22 0807     Visit Number 3    Date for OT Re-Evaluation 07/08/22    Authorization Type [employer]    Authorization Time Period VL: 30 combined OT/ST    Authorization - Visit Number 5    Authorization - Number of Visits 30    OT Start Time 0803    OT Stop Time 0846    OT Time Calculation (min) 43 min    Activity Tolerance Patient tolerated treatment well    Behavior During Therapy [facility] for tasks assessed/performed            History reviewed. No pertinent past medical history.  Past Surgical History:  Procedure Laterality Date   IR ANGIO VERTEBRAL SEL VERTEBRAL UNI L MOD SED  04/03/2022   IR CT HEAD LTD  04/02/2022   IR CT HEAD LTD  04/02/2022   IR PERCUTANEOUS ART THROMBECTOMY/INFUSION INTRACRANIAL INC DIAG ANGIO  04/01/2022   IR PERCUTANEOUS ART THROMBECTOMY/INFUSION INTRACRANIAL INC DIAG ANGIO  04/02/2022   IR US GUIDE VASC ACCESS RIGHT  04/01/2022   IR US GUIDE VASC ACCESS RIGHT  04/02/2022   LOOP RECORDER INSERTION N/A 04/04/2022   Procedure: LOOP RECORDER INSERTION;  Surgeon: Lanier Prude, MD;  [facility]: [facility];  Service: Cardiovascular;  Laterality: N/A;   RADIOLOGY WITH ANESTHESIA N/A 04/01/2022   Procedure: RADIOLOGY WITH ANESTHESIA;  Surgeon: Radiologist, Medication, MD;  Location: [facility];  Service: Radiology;  Laterality: N/A;   Patient Active Problem List   Diagnosis Date Noted   Essential hypertension 04/04/2022   Hyperlipidemia 04/04/2022   Acute ischemic left PCA stroke (HCC) 04/01/2022    ONSET DATE: 04/01/22  REFERRING DIAG: V74.827 (ICD-10-CM) - Acute ischemic left PCA stroke (HCC)   THERAPY DIAG:  Hemiplegia and hemiparesis following cerebral infarction affecting right dominant side (HCC)  Other lack of  coordination  Visuospatial deficit  Other symptoms and signs involving cognitive functions following cerebral infarction  Rationale for Evaluation and Treatment Rehabilitation  SUBJECTIVE:   SUBJECTIVE STATEMENT: Pt reports he has been doing a little cooking at home w/ knife-work and it feels comfortable; has also moved his car around in the yard and tried a few pushups w/out difficulty Pt accompanied by: self  PERTINENT HISTORY: Acute ischemic left PCA stroke due to left P2 occlusion of cryptogenic etiology s/p mechanical thrombectomy x2 needing rescue left PCA stent; loop recorder insertion 04/04/22; MRI indicated acute left PCA territory infarcts involving L thalamus, hippocampus, medial L temporal lobe and occipital lobe.  PRECAUTIONS: No driving until medically cleared  PAIN: Are you having pain? 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 Able to complete all w/out difficulty w/ OT providing relevant education regarding avoiding overworking and focus on slowly returning to increased weight/resistance and reps  Trunk Rotation Supine trunk rotation, slowly rotating knees down toward the mat to alternating sides to target trunk stretch particularly along affected side; able  to complete to both sides w/out difficulty  Coordination Activities Using R hand only to pick up easy-grip pegs one at a time and rotate in-hand to place into resistance pegboard; completed 15 pegs w/ no difficulty/drops   Picking up 5 marbles one at a time, translating each to fingertips, and placing on easy-grip pegs to facilitate  in-hand manipulation, FMC, and intrinsic hand strengthening. Completed w/ min drops; increased success with increased repetition. Used same pattern to remove marbles off pegs.  Education provided on coordination activities pt is able to complete at home to target [facility] and coordination; handout administered at conclusion of session    PATIENT EDUCATION: Continued condition-specific education, particularly including neuro reed and typical recovery patterns, benefits of repetitive engagement in tasks at just right challenge level to facilitate motor learning (also addressed pt-specific tasks and concerns), and visual field deficits and relevant safety considerations/compensatory strategies. Also reviewed previously discussed importance of receiving medical clearance from physician prior to attempting and follow up w/ ophthalmology to further assess visual field deficit. Person educated: Patient Education method: Explanation Education comprehension: verbalized understanding   HOME EXERCISE PROGRAM: To be administered   GOALS: Goals reviewed with patient? No  SHORT TERM GOALS: Target date: 05/17/22    Status:  1 Pt will demonstrate understanding of at least 2 visual perceptual compensatory strategies to incorporate into IADLs prn  Progressing  2 Pt will demonstrate understanding of HEP designed for RUE GM and [facility] for improved participation in meal prep and work-related tasks  Progressing    LONG TERM GOALS: Target date: 07/08/22    Status:  1 Pt will improve efficiency during functional fine motor tasks by improving time to complete 9-HPT w/ RUE by at least 8 sec  Baseline: Right: 41.93 sec; Left: 27.47 sec Progressing  2 Pt will improve accuracy and overall participation in meal prep tasks as evidenced by completing PPT: Item 2 in 10 sec or less in preparation for return to work Baseline: not assessed at evaluation due to medical restrictions Progressing  3 Pt will improve grip strength  in R hand to at least 20 lbs for functional use in IADLs  Baseline: not assessed at evaluation due to medical restrictions Progressing  4 Pt will perform physical and cognitive tasks simultaneously for at least 5 min with no errors to facilitate safety and independence w/ 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 OT also addressed NMR for RUE GMC, visual perceptual deficits, increased somatosensory input through affected UE, safety and compensatory strategies for IADLs (e.g., meal prep, leisure/working out, etc.) and higher level FM skills. Pt continues to make good progress toward goals.  PERFORMANCE DEFICITS in functional skills including coordination, dexterity, sensation, strength, FMC, GMC, mobility, balance, and UE functional use, cognitive skills including attention and memory.  IMPAIRMENTS are limiting patient from IADLs, work, and leisure.   COMORBIDITIES has no other co-morbidities that affects occupational performance. Patient will benefit from skilled OT to address above impairments and improve overall function.   PLAN: OT FREQUENCY: 1x/week (decreased frequency due to insurance-based VL)  OT DURATION: 12 weeks  PLANNED INTERVENTIONS: self care/ADL training, therapeutic exercise, therapeutic activity, neuromuscular re-education, functional mobility training, moist heat, cryotherapy, patient/family education, cognitive remediation/compensation, visual/perceptual remediation/compensation, and DME and/or AE instructions  RECOMMENDED  OTHER SERVICES: Receiving PT and ST services at this location  CONSULTED AND AGREED WITH PLAN OF CARE: Patient  PLAN FOR NEXT SESSION: Assess PPT: Items 1 and 2; continue w/ NMR  of RUE [facility]   Rosie Fate, MSOT, OTR/L 04/25/22, 9:30 AM

## 2022-04-25 NOTE — Therapy (Signed)
OUTPATIENT SPEECH LANGUAGE PATHOLOGY TREATMENT NOTE   Patient Name: Vincent Oconnell MRN: [id number] DOB:17-Apr-1956, 66 y.o., male Today's Date: 04/25/2022  PCP: Not listed REFERRING PROVIDER: Mathews Argyle, NP  END OF SESSION:   End of Session - 04/25/22 0848     Visit Number 3    Number of Visits 17    Date for SLP Re-Evaluation 06/11/22    SLP Start Time 0847    SLP Stop Time  0927    SLP Time Calculation (min) 40 min    Activity Tolerance Patient tolerated treatment well             History reviewed. No pertinent past medical history. Past Surgical History:  Procedure Laterality Date   IR ANGIO VERTEBRAL SEL VERTEBRAL UNI L MOD SED  04/03/2022   IR CT HEAD LTD  04/02/2022   IR CT HEAD LTD  04/02/2022   IR PERCUTANEOUS ART THROMBECTOMY/INFUSION INTRACRANIAL INC DIAG ANGIO  04/01/2022   IR PERCUTANEOUS ART THROMBECTOMY/INFUSION INTRACRANIAL INC DIAG ANGIO  04/02/2022   IR US GUIDE VASC ACCESS RIGHT  04/01/2022   IR US GUIDE VASC ACCESS RIGHT  04/02/2022   LOOP RECORDER INSERTION N/A 04/04/2022   Procedure: LOOP RECORDER INSERTION;  Surgeon: Lanier Prude, MD;  Location: [facility];  Service: Cardiovascular;  Laterality: N/A;   RADIOLOGY WITH ANESTHESIA N/A 04/01/2022   Procedure: RADIOLOGY WITH ANESTHESIA;  Surgeon: Radiologist, Medication, MD;  Location: [facility];  Service: Radiology;  Laterality: N/A;   Patient Active Problem List   Diagnosis Date Noted   Essential hypertension 04/04/2022   Hyperlipidemia 04/04/2022   Acute ischemic left PCA stroke (HCC) 04/01/2022    ONSET DATE: 04/01/22  REFERRING DIAG: I63.9 (ICD-10-CM) - Cerebrovascular accident (CVA), unspecified mechanism (HCC)  THERAPY DIAG:  Cognitive communication deficit  Rationale for Evaluation and Treatment Rehabilitation  SUBJECTIVE: "I do everything around the house."  PAIN:  Are you having pain? No     OBJECTIVE:   TODAY'S TREATMENT:  04/25/22: Skilled ST services targeted  attention this session. Pt reports he would like to return to work in mid September. Began education on attention strategies this session. Pt required minA to provide accurate examples of each attention type. Benefited from repetition of information and additional explanation related to job tasks. Pt reports he does not currently suffer from any fatigue. Will cont attention strategies next session.   04/16/22: Completed CLQT this session and provided cog-comm handout for edu on diagnosis.    Personal Facts: 7/8 Symbol Cancellation: 11/12 ; pt self corrected 5 errors Confrontation Naming: 10/10 Clock Drawing: 12//13 Story Retelling: 5/10 Symbol Trails: 5/10 Generative Naming: 5/9 Design Memory 6/6 Mazes: 4/8 Design Generation 5/13   OVERALL SCORE: MILD  Attention, Memory, Exec Function, Language, Visuospatial (ALL MILD)        PATIENT EDUCATION: Education details: Cog comm impairment Person educated: Patient Education method: Explanation Education comprehension: verbalized understanding and needs further education         GOALS: Goals reviewed with patient? Yes   SHORT TERM GOALS: Target date: 05/08/2022     Pt will increase auditory comprehension by recalling and verbalizing strategies to assist with active listening during conversation with minA. Baseline: Goal status: INITIAL   2.  Pt will comprehend functional memory or visual aids for recall of important information during structured conversations with minA. Baseline:  Goal status: INITIAL     LONG TERM GOALS: Target date: 06/05/2022       Pt will comprehend  functional memory or visual aids for recall of important information during unstructured conversations independently. Baseline:  Goal status: INITIAL   2.  Pt will increase auditory comprehension by recalling and verbalizing strategies to assist with active listening during unstructured conversation independently. Baseline:  Goal status: INITIAL    ASSESSMENT:   CLINICAL IMPRESSION: See tx note. Answered all pt questions today. Cont with current POC.  SLP rec skilled ST services to address cognitive-communication impairment to maximize functional communication and increase pt ability to return to work.    OBJECTIVE IMPAIRMENTS include attention, memory, awareness, and executive functioning. These impairments are limiting patient from return to work and effectively communicating at home and in community. Factors affecting potential to achieve goals and functional outcome are  NA .Marland Kitchen Patient will benefit from skilled SLP services to address above impairments and improve overall function.   REHAB POTENTIAL: Good   PLAN: SLP FREQUENCY: 1-2x/week   SLP DURATION: 8 weeks   PLANNED INTERVENTIONS: Environmental controls, Cueing hierachy, Cognitive reorganization, Internal/external aids, Functional tasks, SLP instruction and feedback, Compensatory strategies, and Patient/family education    [place], CCC-SLP 04/25/2022, 8:49 AM

## 2022-04-26 ENCOUNTER — Other Ambulatory Visit: Payer: Self-pay

## 2022-04-29 ENCOUNTER — Encounter: Payer: Self-pay | Admitting: Speech Pathology

## 2022-04-29 ENCOUNTER — Encounter: Payer: Self-pay | Admitting: Occupational Therapy

## 2022-04-29 ENCOUNTER — Ambulatory Visit: Payer: Managed Care, Other (non HMO) | Admitting: Speech Pathology

## 2022-04-29 ENCOUNTER — Ambulatory Visit: Payer: Managed Care, Other (non HMO) | Admitting: Occupational Therapy

## 2022-04-29 DIAGNOSIS — I69318 Other symptoms and signs involving cognitive functions following cerebral infarction: Secondary | ICD-10-CM

## 2022-04-29 DIAGNOSIS — R41842 Visuospatial deficit: Secondary | ICD-10-CM

## 2022-04-29 DIAGNOSIS — R41841 Cognitive communication deficit: Secondary | ICD-10-CM

## 2022-04-29 DIAGNOSIS — R278 Other lack of coordination: Secondary | ICD-10-CM

## 2022-04-29 DIAGNOSIS — I69351 Hemiplegia and hemiparesis following cerebral infarction affecting right dominant side: Secondary | ICD-10-CM | POA: Diagnosis not present

## 2022-04-29 NOTE — Therapy (Signed)
OUTPATIENT SPEECH LANGUAGE PATHOLOGY TREATMENT NOTE   Patient Name: Vincent Oconnell MRN: [id number] DOB:26-Jan-1956, 66 y.o., male Today's Date: 04/29/2022  PCP: Not listed REFERRING PROVIDER: Mathews Argyle, NP  END OF SESSION:   End of Session - 04/29/22 1402     Visit Number 4    Number of Visits 17    Date for SLP Re-Evaluation 06/11/22    SLP Start Time 1400    SLP Stop Time  1440    SLP Time Calculation (min) 40 min    Activity Tolerance Patient tolerated treatment well             History reviewed. No pertinent past medical history. Past Surgical History:  Procedure Laterality Date   IR ANGIO VERTEBRAL SEL VERTEBRAL UNI L MOD SED  04/03/2022   IR CT HEAD LTD  04/02/2022   IR CT HEAD LTD  04/02/2022   IR PERCUTANEOUS ART THROMBECTOMY/INFUSION INTRACRANIAL INC DIAG ANGIO  04/01/2022   IR PERCUTANEOUS ART THROMBECTOMY/INFUSION INTRACRANIAL INC DIAG ANGIO  04/02/2022   IR US GUIDE VASC ACCESS RIGHT  04/01/2022   IR US GUIDE VASC ACCESS RIGHT  04/02/2022   LOOP RECORDER INSERTION N/A 04/04/2022   Procedure: LOOP RECORDER INSERTION;  Surgeon: Lanier Prude, MD;  [facility]: [facility];  Service: Cardiovascular;  Laterality: N/A;   RADIOLOGY WITH ANESTHESIA N/A 04/01/2022   Procedure: RADIOLOGY WITH ANESTHESIA;  Surgeon: Radiologist, Medication, MD;  Location: [facility];  Service: Radiology;  Laterality: N/A;   Patient Active Problem List   Diagnosis Date Noted   Essential hypertension 04/04/2022   Hyperlipidemia 04/04/2022   Acute ischemic left PCA stroke (HCC) 04/01/2022    ONSET DATE: 04/01/22  REFERRING DIAG: I63.9 (ICD-10-CM) - Cerebrovascular accident (CVA), unspecified mechanism (HCC)  THERAPY DIAG:  Cognitive communication deficit  Rationale for Evaluation and Treatment Rehabilitation  SUBJECTIVE: "I do everything around the house."  PAIN:  Are you having pain? No     OBJECTIVE:   TODAY'S TREATMENT:  04/29/22: Skilled ST services targeted  attention this session. Pt reports he feels that he is not having trouble. He had several questions about returning to work. Currently, SLP is most concerned about attention and memory. SLP explained that the doctor is the person that releases patients back to work; however, in terms of cognitive skills, SLP suspects pt will be ready to trial work around late [facility] Oct. Pt continues to require redirection or repetition of information to adequately recall verbal information. Pt to complete edu on attention strategies next session.   04/25/22: Skilled ST services targeted attention this session. Pt reports he would like to return to work in mid September. Began education on attention strategies this session. Pt required minA to provide accurate examples of each attention type. Benefited from repetition of information and additional explanation related to job tasks. Pt reports he does not currently suffer from any fatigue. Will cont attention strategies next session.   04/16/22: Completed CLQT this session and provided cog-comm handout for edu on diagnosis.    Personal Facts: 7/8 Symbol Cancellation: 11/12 ; pt self corrected 5 errors Confrontation Naming: 10/10 Clock Drawing: 12//13 Story Retelling: 5/10 Symbol Trails: 5/10 Generative Naming: 5/9 Design Memory 6/6 Mazes: 4/8 Design Generation 5/13   OVERALL SCORE: MILD  Attention, Memory, Exec Function, Language, Visuospatial (ALL MILD)        PATIENT EDUCATION: Education details: Cog comm impairment Person educated: Patient Education method: Explanation Education comprehension: verbalized understanding and needs further education  GOALS: Goals reviewed with patient? Yes   SHORT TERM GOALS: Target date: 05/08/2022     Pt will increase auditory comprehension by recalling and verbalizing strategies to assist with active listening during conversation with minA. Baseline: Goal status: INITIAL   2.  Pt will comprehend  functional memory or visual aids for recall of important information during structured conversations with minA. Baseline:  Goal status: INITIAL     LONG TERM GOALS: Target date: 06/05/2022       Pt will comprehend functional memory or visual aids for recall of important information during unstructured conversations independently. Baseline:  Goal status: INITIAL   2.  Pt will increase auditory comprehension by recalling and verbalizing strategies to assist with active listening during unstructured conversation independently. Baseline:  Goal status: INITIAL   ASSESSMENT:   CLINICAL IMPRESSION: See tx note. Answered all pt questions today. Cont with current POC.  SLP rec skilled ST services to address cognitive-communication impairment to maximize functional communication and increase pt ability to return to work.    OBJECTIVE IMPAIRMENTS include attention, memory, awareness, and executive functioning. These impairments are limiting patient from return to work and effectively communicating at home and in community. Factors affecting potential to achieve goals and functional outcome are  NA .Marland Kitchen Patient will benefit from skilled SLP services to address above impairments and improve overall function.   REHAB POTENTIAL: Good   PLAN: SLP FREQUENCY: 1-2x/week   SLP DURATION: 8 weeks   PLANNED INTERVENTIONS: Environmental controls, Cueing hierachy, Cognitive reorganization, Internal/external aids, Functional tasks, SLP instruction and feedback, Compensatory strategies, and Patient/family education    [place], CCC-SLP 04/29/2022, 2:03 PM

## 2022-04-29 NOTE — Therapy (Signed)
OUTPATIENT OCCUPATIONAL THERAPY TREATMENT SESSION  Patient Name: Vincent Oconnell MRN: [id number] DOB:[birth year], 66 y.o., male Today's Date: 04/29/2022  PCP: Not on file REFERRING PROVIDER: August Albino, NP    OT End of Session - 04/29/22 1320     Visit Number 4    Date for OT Re-Evaluation 07/08/22    Authorization Type [employer]    Authorization Time Period VL: 30 combined OT/ST    Authorization - Visit Number 7    Authorization - Number of Visits 30    OT Start Time 1962    OT Stop Time 1356    OT Time Calculation (min) 40 min    Activity Tolerance Patient tolerated treatment well    Behavior During Therapy [facility] for tasks assessed/performed            History reviewed. No pertinent past medical history.  Past Surgical History:  Procedure Laterality Date   IR ANGIO VERTEBRAL SEL VERTEBRAL UNI L MOD SED  04/03/2022   IR CT HEAD LTD  04/02/2022   IR CT HEAD LTD  04/02/2022   IR PERCUTANEOUS ART THROMBECTOMY/INFUSION INTRACRANIAL INC DIAG ANGIO  04/01/2022   IR PERCUTANEOUS ART THROMBECTOMY/INFUSION INTRACRANIAL INC DIAG ANGIO  04/02/2022   IR US GUIDE VASC ACCESS RIGHT  04/01/2022   IR US GUIDE VASC ACCESS RIGHT  04/02/2022   LOOP RECORDER INSERTION N/A 04/04/2022   Procedure: LOOP RECORDER INSERTION;  Surgeon: Vickie Epley, MD;  Location: [place] [facility];  Service: Cardiovascular;  Laterality: N/A;   RADIOLOGY WITH ANESTHESIA N/A 04/01/2022   Procedure: RADIOLOGY WITH ANESTHESIA;  Surgeon: Radiologist, Medication, MD;  Location: [place];  Service: Radiology;  Laterality: N/A;   Patient Active Problem List   Diagnosis Date Noted   Essential hypertension 04/04/2022   Hyperlipidemia 04/04/2022   Acute ischemic left PCA stroke ([place]) 04/01/2022    ONSET DATE: 04/01/22  REFERRING DIAG: I29.798 (ICD-10-CM) - Acute ischemic left PCA stroke (HCC)   THERAPY DIAG:  Hemiplegia and hemiparesis following cerebral infarction affecting right dominant side (HCC)  Other lack of  coordination  Visuospatial deficit  Other symptoms and signs involving cognitive functions following cerebral infarction  Rationale for Evaluation and Treatment Rehabilitation  SUBJECTIVE:   SUBJECTIVE STATEMENT: Pt reports he has a follow-up w/ neurology next week and is unsure if he has an appt w/ ophthalmology scheduled yet or not Pt accompanied by: self  PERTINENT HISTORY: Acute ischemic left PCA stroke due to left P2 occlusion of cryptogenic etiology s/p mechanical thrombectomy x2 needing rescue left PCA stent; loop recorder insertion 04/04/22; MRI indicated acute left PCA territory infarcts involving L thalamus, hippocampus, medial L temporal lobe and occipital lobe.  PRECAUTIONS: No driving until medically cleared  PAIN: Are you having pain? 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 Completed w/ minimal drops; increased success with decreased speed. Used translation pattern to retrieve marbles off pegs and place into container.    PATIENT EDUCATION: Continued  condition-specific education, particularly including neuro reed and typical recovery patterns, visual field deficits and relevant safety considerations/compensatory strategies, as well as progress toward goals and interpretation of improvements w/ objective measurements Person educated: Patient Education method: Explanation Education comprehension: verbalized  understanding   HOME EXERCISE PROGRAM: Putty exercises, coordination activities (printed handout)   GOALS: Goals reviewed with patient? No  SHORT TERM GOALS: Target date: 05/17/22    Status:  1 Pt will demonstrate understanding of at least 2 visual perceptual compensatory strategies to incorporate into IADLs prn  Met - 04/25/22  2 Pt will demonstrate understanding of HEP designed for RUE GM and FMC for improved participation in meal prep and work-related tasks  Met - 04/29/22    LONG TERM GOALS: Target date: 07/08/22    Status:  1 Pt will improve efficiency during functional fine motor tasks by improving time to complete 9-HPT w/ RUE by at least 8 sec  Baseline: Right: 41.93 sec; Left: 27.47 sec Met - 04/29/22: 28.21 sec  2 Pt will improve accuracy and overall participation in meal prep tasks as evidenced by completing PPT: Item 2 in 10 sec or less in preparation for return to work Baseline: not assessed at evaluation Met - 04/29/22:  10 sec  3 Pt will improve grip strength in R hand to at least 20 lbs for functional use in IADLs  Baseline: not assessed at evaluation due to medical restrictions Met - 04/29/22: 30+ lbs w/ RUE  4 Pt will perform physical and cognitive tasks simultaneously for at least 5 min with no errors to facilitate safety and independence w/ hopeful return to work as a chef Progressing    ASSESSMENT:  CLINICAL IMPRESSION: Pt is doing very well and met multiple goals this session. OT started session focusing on NMR of gross and FMC using pegboard and marble activities. Due to smoothness of movements, control and coordination, as well as overall success observed w/ both activities, OT reassessed progress toward functional goals. Pt improved time to complete 9-HPT  by 13 seconds and completed PPT: Item 2 WFL. OT also assessed grip strength due to pt's report of no concern w/ hand strength at this time; did not instruct pt to apply maximum amount of force w/ pt still able  to achieve grip strength WFL. OT provided pertinent education regarding objective improvements and continued condition-specific education, answering pt questions as able. OT also reviewed recommended HEP to facilitate carryover to home. Despite progress made this session, pt will continue to benefit from skilled OT services to improve higher level performance skills and dual tasking to facilitate return to high level IADLs and work as a chef. Pt agreeable to continued plan.  PERFORMANCE DEFICITS in functional skills including coordination, dexterity, sensation, strength, FMC, GMC, mobility, balance, and UE functional use, cognitive skills including attention and memory.  IMPAIRMENTS are limiting patient from IADLs, work, and leisure.   COMORBIDITIES has no other co-morbidities that affects occupational performance. Patient will benefit from skilled OT to address above impairments and improve overall function.   PLAN: OT FREQUENCY: 1x/week (decreased frequency due to insurance-based VL)  OT DURATION: 12 weeks  PLANNED INTERVENTIONS: self care/ADL training, therapeutic exercise, therapeutic activity, neuromuscular re-education, functional mobility training, moist heat, cryotherapy, patient/family education, cognitive remediation/compensation, visual/perceptual remediation/compensation, and DME and/or AE instructions  RECOMMENDED OTHER SERVICES: Receiving PT and ST services at this location  CONSULTED AND AGREED WITH PLAN OF CARE: Patient  PLAN FOR NEXT SESSION: Higher level dual tasking (physical/cognitive), functional mobility, practice of   visual compensatory strategies   Kathrine Cords, MSOT, OTR/L 04/29/22, 2:21 PM

## 2022-05-02 ENCOUNTER — Other Ambulatory Visit (HOSPITAL_COMMUNITY): Payer: Self-pay

## 2022-05-06 ENCOUNTER — Ambulatory Visit: Payer: Managed Care, Other (non HMO) | Admitting: Speech Pathology

## 2022-05-06 ENCOUNTER — Ambulatory Visit (INDEPENDENT_AMBULATORY_CARE_PROVIDER_SITE_OTHER): Payer: Managed Care, Other (non HMO)

## 2022-05-06 ENCOUNTER — Encounter: Payer: Self-pay | Admitting: Speech Pathology

## 2022-05-06 ENCOUNTER — Ambulatory Visit: Payer: Managed Care, Other (non HMO) | Admitting: Occupational Therapy

## 2022-05-06 DIAGNOSIS — I63532 Cerebral infarction due to unspecified occlusion or stenosis of left posterior cerebral artery: Secondary | ICD-10-CM

## 2022-05-06 DIAGNOSIS — I69351 Hemiplegia and hemiparesis following cerebral infarction affecting right dominant side: Secondary | ICD-10-CM

## 2022-05-06 DIAGNOSIS — I69318 Other symptoms and signs involving cognitive functions following cerebral infarction: Secondary | ICD-10-CM

## 2022-05-06 DIAGNOSIS — R41841 Cognitive communication deficit: Secondary | ICD-10-CM

## 2022-05-06 DIAGNOSIS — R278 Other lack of coordination: Secondary | ICD-10-CM

## 2022-05-06 DIAGNOSIS — R41842 Visuospatial deficit: Secondary | ICD-10-CM

## 2022-05-06 NOTE — Therapy (Signed)
OUTPATIENT SPEECH LANGUAGE PATHOLOGY TREATMENT NOTE   Patient Name: Vincent Oconnell MRN: [id number] DOB:[birth year], 66 y.o., male Today's Date: 05/06/2022  PCP: Not listed REFERRING PROVIDER: Mathews Argyle, NP  END OF SESSION:   End of Session - 05/06/22 1235     Visit Number 5    Number of Visits 17    Date for SLP Re-Evaluation 06/11/22    SLP Start Time 1230    SLP Stop Time  1310    SLP Time Calculation (min) 40 min    Activity Tolerance Patient tolerated treatment well             History reviewed. No pertinent past medical history. Past Surgical History:  Procedure Laterality Date   IR ANGIO VERTEBRAL SEL VERTEBRAL UNI L MOD SED  04/03/2022   IR CT HEAD LTD  04/02/2022   IR CT HEAD LTD  04/02/2022   IR PERCUTANEOUS ART THROMBECTOMY/INFUSION INTRACRANIAL INC DIAG ANGIO  04/01/2022   IR PERCUTANEOUS ART THROMBECTOMY/INFUSION INTRACRANIAL INC DIAG ANGIO  04/02/2022   IR US GUIDE VASC ACCESS RIGHT  04/01/2022   IR US GUIDE VASC ACCESS RIGHT  04/02/2022   LOOP RECORDER INSERTION N/A 04/04/2022   Procedure: LOOP RECORDER INSERTION;  Surgeon: Lanier Prude, MD;  [facility];  Service: Cardiovascular;  Laterality: N/A;   RADIOLOGY WITH ANESTHESIA N/A 04/01/2022   Procedure: RADIOLOGY WITH ANESTHESIA;  Surgeon: Radiologist, Medication, MD;  Location: [facility];  Service: Radiology;  Laterality: N/A;   Patient Active Problem List   Diagnosis Date Noted   Essential hypertension 04/04/2022   Hyperlipidemia 04/04/2022   Acute ischemic left PCA stroke (HCC) 04/01/2022    ONSET DATE: 04/01/22  REFERRING DIAG: I63.9 (ICD-10-CM) - Cerebrovascular accident (CVA), unspecified mechanism (HCC)  THERAPY DIAG:  Cognitive communication deficit  Rationale for Evaluation and Treatment Rehabilitation  SUBJECTIVE: "I do everything around the house."  PAIN:  Are you having pain? No     OBJECTIVE:   TODAY'S TREATMENT:  05/06/22: Skilled ST services targeted  attention this session. Provided continued education on attention strategies. Pt continues to report, "I already do these things". Pt is not able to recall complex information provided to him verbally. He has continued to ask the same questions and tell the same stories. Pt brought up his sensation in his arm. SLP explained that OT is the therapy that addresses this. Pt reports OT has not told him how sensation works in his arm. SLP knows that pt has been told this information several times, as she has heard OT explain this to patient. SLP made pt aware that his memory is impacted. He would benefit from using strategies. SLP provided him with pencil and paper to write down information provided by OT in the next session. He needed several repetitions and to be told what to write down. We will cont to work on strategies to increase his memory and awareness of deficits.   04/29/22: Skilled ST services targeted attention this session. Pt reports he feels that he is not having trouble. He had several questions about returning to work. Currently, SLP is most concerned about attention and memory. SLP explained that the doctor is the person that releases patients back to work; however, in terms of cognitive skills, SLP suspects pt will be ready to trial work around late [facility] Oct. Pt continues to require redirection or repetition of information to adequately recall verbal information. Pt to complete edu on attention strategies next session.   04/25/22: Skilled  ST services targeted attention this session. Pt reports he would like to return to work in mid September. Began education on attention strategies this session. Pt required minA to provide accurate examples of each attention type. Benefited from repetition of information and additional explanation related to job tasks. Pt reports he does not currently suffer from any fatigue. Will cont attention strategies next session.   04/16/22: Completed CLQT this session  and provided cog-comm handout for edu on diagnosis.    Personal Facts: 7/8 Symbol Cancellation: 11/12 ; pt self corrected 5 errors Confrontation Naming: 10/10 Clock Drawing: 12//13 Story Retelling: 5/10 Symbol Trails: 5/10 Generative Naming: 5/9 Design Memory 6/6 Mazes: 4/8 Design Generation 5/13   OVERALL SCORE: MILD  Attention, Memory, Exec Function, Language, Visuospatial (ALL MILD)        PATIENT EDUCATION: Education details: Cog comm impairment Person educated: Patient Education method: Explanation Education comprehension: verbalized understanding and needs further education         GOALS: Goals reviewed with patient? Yes   SHORT TERM GOALS: Target date: 05/08/2022     Pt will increase auditory comprehension by recalling and verbalizing strategies to assist with active listening during conversation with minA. Baseline: Goal status: INITIAL   2.  Pt will comprehend functional memory or visual aids for recall of important information during structured conversations with minA. Baseline:  Goal status: INITIAL     LONG TERM GOALS: Target date: 06/05/2022       Pt will comprehend functional memory or visual aids for recall of important information during unstructured conversations independently. Baseline:  Goal status: INITIAL   2.  Pt will increase auditory comprehension by recalling and verbalizing strategies to assist with active listening during unstructured conversation independently. Baseline:  Goal status: INITIAL   ASSESSMENT:   CLINICAL IMPRESSION: See tx note. Answered all pt questions today. Cont with current POC.  SLP rec skilled ST services to address cognitive-communication impairment to maximize functional communication and increase pt ability to return to work.    OBJECTIVE IMPAIRMENTS include attention, memory, awareness, and executive functioning. These impairments are limiting patient from return to work and effectively communicating at home  and in community. Factors affecting potential to achieve goals and functional outcome are  NA .Marland Kitchen Patient will benefit from skilled SLP services to address above impairments and improve overall function.   REHAB POTENTIAL: Good   PLAN: SLP FREQUENCY: 1-2x/week   SLP DURATION: 8 weeks   PLANNED INTERVENTIONS: Environmental controls, Cueing hierachy, Cognitive reorganization, Internal/external aids, Functional tasks, SLP instruction and feedback, Compensatory strategies, and Patient/family education    [place], CCC-SLP 05/06/2022, 12:35 PM

## 2022-05-06 NOTE — Therapy (Signed)
OUTPATIENT OCCUPATIONAL THERAPY TREATMENT SESSION  Patient Name: Vincent Oconnell MRN: [id number] DOB:15-Apr-1956, 66 y.o., male Today's Date: 05/06/2022  PCP: Not on file REFERRING PROVIDER: August Albino, NP    OT End of Session - 05/06/22 1418     Visit Number 5    Date for OT Re-Evaluation 07/08/22    Authorization Type [employer]    Authorization Time Period VL: 30 combined OT/ST    Authorization - Visit Number 10    Authorization - Number of Visits 30    OT Start Time 1062    OT Stop Time 1405    OT Time Calculation (min) 48 min    Activity Tolerance Patient tolerated treatment well    Behavior During Therapy [facility] for tasks assessed/performed            No past medical history on file.  Past Surgical History:  Procedure Laterality Date   IR ANGIO VERTEBRAL SEL VERTEBRAL UNI L MOD SED  04/03/2022   IR CT HEAD LTD  04/02/2022   IR CT HEAD LTD  04/02/2022   IR PERCUTANEOUS ART THROMBECTOMY/INFUSION INTRACRANIAL INC DIAG ANGIO  04/01/2022   IR PERCUTANEOUS ART THROMBECTOMY/INFUSION INTRACRANIAL INC DIAG ANGIO  04/02/2022   IR US GUIDE VASC ACCESS RIGHT  04/01/2022   IR US GUIDE VASC ACCESS RIGHT  04/02/2022   LOOP RECORDER INSERTION N/A 04/04/2022   Procedure: LOOP RECORDER INSERTION;  Surgeon: Vickie Epley, MD;  Location: [place] [facility];  Service: Cardiovascular;  Laterality: N/A;   RADIOLOGY WITH ANESTHESIA N/A 04/01/2022   Procedure: RADIOLOGY WITH ANESTHESIA;  Surgeon: Radiologist, Medication, MD;  Location: [facility];  Service: Radiology;  Laterality: N/A;   Patient Active Problem List   Diagnosis Date Noted   Essential hypertension 04/04/2022   Hyperlipidemia 04/04/2022   Acute ischemic left PCA stroke ([place]) 04/01/2022    ONSET DATE: 04/01/22  REFERRING DIAG: I94.854 (ICD-10-CM) - Acute ischemic left PCA stroke (HCC)   THERAPY DIAG:  Hemiplegia and hemiparesis following cerebral infarction affecting right dominant side (HCC)  Other lack of  coordination  Visuospatial deficit  Other symptoms and signs involving cognitive functions following cerebral infarction  Rationale for Evaluation and Treatment Rehabilitation  SUBJECTIVE:   SUBJECTIVE STATEMENT: Pt reports he continues to have numbness and altered sensation along the R side of his face, trunk, and upper arm; states there is a mild feeling of pins and needles in the fingertips of his R hand Pt accompanied by: self  PERTINENT HISTORY: Acute ischemic left PCA stroke due to left P2 occlusion of cryptogenic etiology s/p mechanical thrombectomy x2 needing rescue left PCA stent; loop recorder insertion 04/04/22; MRI indicated acute left PCA territory infarcts involving L thalamus, hippocampus, medial L temporal lobe and occipital lobe.  PRECAUTIONS: No driving until medically cleared  PAIN: Are you having pain? 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  Dual Tasking Copying pattern onto pegboard while naming something designated by the corresponding category indicated by color; activity addressed dual tasking w/ [facility] and cognitive components, coordination, alternating attention, hand strengthening, and visual perception. Pt able  to complete activity w/ 1 self-correction of spatial orientation error w/ OT providing verbal cue x1 for correction when pt repeated something he had already said. Otherwise able to complete w/out difficulty.   Sensory Re-education Provided  corresponding education related to sensory re-education w/ corresponding handout provided: reviewed safety considerations and strategies/exercises to facilitate improvement, particularly related to deficits involving light touch and stereognosis. Completed stereognosis activity, identifying 4 common objects by touch only; able to identify 3/4 objects w/ L hand w/out difficulty. Pt verbalized understanding of provided education.    PATIENT EDUCATION: See treatment section above Person educated: Patient Education method: Explanation Education comprehension: verbalized understanding   HOME EXERCISE PROGRAM: Putty exercises, coordination activities (printed handout)   GOALS: Goals reviewed with patient? No  SHORT TERM GOALS: Target date: 05/17/22    Status:  1 Pt will demonstrate understanding of at least 2 visual perceptual compensatory strategies to incorporate into IADLs prn  Met - 04/25/22  2 Pt will demonstrate understanding of HEP designed for RUE GM and [facility] for improved participation in meal prep and work-related tasks  Met - 04/29/22    LONG TERM GOALS: Target date: 07/08/22    Status:  1 Pt will improve efficiency during functional fine motor tasks by improving time to complete 9-HPT w/ RUE by at least 8 sec  Baseline: Right: 41.93 sec; Left: 27.47 sec Met - 04/29/22: 28.21 sec  2 Pt will improve accuracy and overall participation in meal prep tasks as evidenced by completing PPT: Item 2 in 10 sec or less in preparation for return to work Baseline: not assessed at evaluation Met - 04/29/22:  10 sec  3 Pt will improve grip strength in R hand to at least 20 lbs for functional use in IADLs  Baseline: not assessed at evaluation due to medical restrictions Met - 04/29/22: 30+ lbs w/ RUE  4 Pt will perform physical and cognitive tasks simultaneously for at least 5 min with no errors to facilitate safety and independence w/ 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 OT facilitated thorough discussion related to ongoing condition-specific education to improve understanding of functional impacts and anticipatory awareness. Pt incorporated memory compensatory strategies this session to assist w/ recall of information as OT has provided education related to difference between motor and sensory impact post-CVA, visual deficits, and safety considerations across all sessions w/ limited retention. However, pt continues to be very motivated to improve and reports he relies on handouts and HEP for continued improvement at home. Pt does have a hospital follow-up session w/ neurology on Thursday, 8/24 and OT encouraged pt to ask relevant questions related to return to driving and return to work during this appt; pt verbalized understanding. Also encouraged follow-up w/ ophthalmology for comprehensive assessment of potential R visual field deficit. OT will continue to address and improve higher level performance skills and dual tasking to facilitate return to high level IADLs and work as a Biomedical scientist. Pt agreeable to continued plan.  PERFORMANCE DEFICITS in functional skills including coordination, dexterity, sensation, strength, FMC, GMC, mobility, balance, and UE functional use, cognitive skills including attention and memory.  IMPAIRMENTS are limiting patient from IADLs, work, and leisure.   COMORBIDITIES has no other co-morbidities that affects occupational performance. Patient will benefit from skilled OT to address above impairments and  improve overall function.   PLAN: OT FREQUENCY: 1x/week (decreased frequency due to insurance-based VL)  OT DURATION: 12 weeks  PLANNED INTERVENTIONS: self care/ADL training, therapeutic exercise, therapeutic  activity, neuromuscular re-education, functional mobility training, moist heat, cryotherapy, patient/family education, cognitive remediation/compensation, visual/perceptual remediation/compensation, and DME and/or AE instructions  RECOMMENDED OTHER SERVICES: Receiving PT and ST services at this location  CONSULTED AND AGREED WITH PLAN OF CARE: Patient  PLAN FOR NEXT SESSION: Higher level dual tasking (physical/cognitive), functional mobility, practice of visual compensatory strategies   Kathrine Cords, MSOT, OTR/L 05/06/22, 2:19 PM

## 2022-05-08 LAB — CUP PACEART REMOTE DEVICE CHECK
Date Time Interrogation Session: 20230821022327
Implantable Pulse Generator Implant Date: 20230720
Pulse Gen Serial Number: 511012320

## 2022-05-09 ENCOUNTER — Encounter: Payer: Self-pay | Admitting: Adult Health

## 2022-05-09 ENCOUNTER — Ambulatory Visit: Payer: Managed Care, Other (non HMO) | Admitting: Adult Health

## 2022-05-09 VITALS — BP 148/83 | HR 73 | Ht 67.0 in | Wt 187.2 lb

## 2022-05-09 DIAGNOSIS — E785 Hyperlipidemia, unspecified: Secondary | ICD-10-CM

## 2022-05-09 DIAGNOSIS — I63432 Cerebral infarction due to embolism of left posterior cerebral artery: Secondary | ICD-10-CM | POA: Diagnosis not present

## 2022-05-09 NOTE — Progress Notes (Signed)
PATIENT: Vincent Oconnell DOB: [birth year]  REASON FOR VISIT: follow up HISTORY FROM: patient PRIMARY NEUROLOGIST: Dr. Leonie Man  Chief Complaint  Patient presents with   [place],  Stroke 04-01-2022. Doing OT/ SLP Vincent Oconnell.  No PT needed any longer.  Need opthamologist appt around 06-02-2022, will check closer to home. Wife with pt.      HISTORY OF PRESENT ILLNESS: Today 05/09/22: Vincent Oconnell is a 66 year old male with a history of left PCA infarct.  He returns today for follow-up.  Overall he feels that he is doing well.  He states that his vision has returned to his baseline.  Reports that in the right upper extremity he still feels some numbness but denies any significant weakness.  He is working with occupational therapy.  No longer in physical therapy.  He is currently taking aspirin and Brilinta.  Remains on Lipitor for his cholesterol.  Has an appointment in September with his PCP.  He did get the loop recorder implanted.  He returns today for an evaluation.   HISTORY ( copied from Dr. Leonie Man note): Vincent Oconnell is a 66 y.o. male with a past medical history significant for arthritis, no other known medical problems and not on any medications.     He has been in his usual state of health and had been feeling well including mowing the lawn this morning.  He had gone to the gym and was working out his leg muscles.  He got up and adjusted the weights (was not in the middle of straining), when he suddenly felt dizzy and had to sit down on the weight bench.  He noted he could not see on the right side.  On EMS arrival they noted that he had significant right arm and leg weakness as well.    He initially had an NIH stroke scale of 3 (partial hemianopia on the right side, right leg drift, mild dysarthria, one-point each).  TNK checklist was reviewed and patient was consented with appropriate delivery of TNK after negative head CT.  Subsequently he developed worsening symptoms  with an NIH as high as 12 (2 points for drowsiness, 2 points for full right hemianopia, 3 points for right arm weakness, 3 points for right leg weakness, one-point for sensory loss on the right side, one-point for dysarthria).  Head CT was repeated and did not show any hemorrhage. Initial CTA was poor diagnostic quality and therefore was repeated and demonstrated left P2 occlusion for which risks and benefits of thrombectomy were discussed with wife.  She agreed that he would want thrombectomy and patient also assented.  MRI  Acute left PCA territory infarcts, described above. Associated edema and petechial hemorrhage without mass occupying acute hemorrhage or significant mass effect. 2D Echo 60-65%, no thrombus or shunt identified Recommend Loop recorder  LDL 128 HgbA1c 5.9 VTE prophylaxis - Recommended patient  Patient she is being.  Patient is stable for night she  REVIEW OF SYSTEMS: Out of a complete 14 system review of symptoms, the patient complains only of the following symptoms, and all other reviewed systems are negative.  ALLERGIES: Allergies  Allergen Reactions   Other Other (See Comments)    NO NARCOTICS - per pt's wife    HOME MEDICATIONS: Outpatient Medications Prior to Visit  Medication Sig Dispense Refill   aspirin 81 MG chewable tablet Chew 1 tablet (81 mg total) by mouth daily. 30 tablet 12   atorvastatin (LIPITOR) 80 MG tablet Take  1 tablet (80 mg total) by mouth daily. 30 tablet 0   Multiple Vitamins-Minerals (MULTIVITAMIN MEN 50+) TABS Take 1 tablet by mouth daily.     ticagrelor (BRILINTA) 90 MG TABS tablet Take 1 tablet (90 mg total) by mouth 2 (two) times daily. 60 tablet 0   No facility-administered medications prior to visit.    PAST MEDICAL HISTORY: No past medical history on file.  PAST SURGICAL HISTORY: Past Surgical History:  Procedure Laterality Date   IR ANGIO VERTEBRAL SEL VERTEBRAL UNI L MOD SED  04/03/2022   IR CT HEAD LTD  04/02/2022   IR CT  HEAD LTD  04/02/2022   IR PERCUTANEOUS ART THROMBECTOMY/INFUSION INTRACRANIAL INC DIAG ANGIO  04/01/2022   IR PERCUTANEOUS ART THROMBECTOMY/INFUSION INTRACRANIAL INC DIAG ANGIO  04/02/2022   IR US GUIDE VASC ACCESS RIGHT  04/01/2022   IR US GUIDE VASC ACCESS RIGHT  04/02/2022   LOOP RECORDER INSERTION N/A 04/04/2022   Procedure: LOOP RECORDER INSERTION;  Surgeon: Lanier Prude, MD;  [facility];  Service: Cardiovascular;  Laterality: N/A;   RADIOLOGY WITH ANESTHESIA N/A 04/01/2022   Procedure: RADIOLOGY WITH ANESTHESIA;  Surgeon: Radiologist, Medication, MD;  Location: [facility];  Service: Radiology;  Laterality: N/A;    FAMILY HISTORY: No family history on file.  SOCIAL HISTORY: Social History   Socioeconomic History   Marital status: Legally Separated    Spouse name: Not on file   Number of children: Not on file   Years of education: Not on file   Highest education level: Not on file  Occupational History   Not on file  Tobacco Use   Smoking status: Never   Smokeless tobacco: Never  Substance and Sexual Activity   Alcohol use: No   Drug use: Not on file   Sexual activity: Not on file  Other Topics Concern   Not on file  Social History Narrative   Not on file   Social Determinants of Health   Financial Resource Strain: Not on file  Food Insecurity: Not on file  Transportation Needs: Not on file  Physical Activity: Not on file  Stress: Not on file  Social Connections: Not on file  Intimate Partner Violence: Not on file      PHYSICAL EXAM  There were no vitals filed for this visit. There is no height or weight on file to calculate BMI.  Generalized: Well developed, in no acute distress   Neurological examination  Mentation: Alert oriented to time, place, history taking. Follows all commands speech and language fluent Cranial nerve II-XII: Pupils were equal round reactive to light. Extraocular movements were full, visual field were full on  confrontational test. Facial sensation and strength were normal. Uvula tongue midline. Head turning and shoulder shrug  were normal and symmetric. Motor: The motor testing reveals 5 over 5 strength of all 4 extremities. Good symmetric motor tone is noted throughout.  Sensory: Sensory testing is intact to soft touch on all 4 extremities. No evidence of extinction is noted.  Coordination: Cerebellar testing reveals good finger-nose-finger and heel-to-shin bilaterally.  Gait and station: Gait is normal. Tandem gait is normal. Romberg is negative. No drift is seen.  Reflexes: Deep tendon reflexes are symmetric and normal bilaterally.   DIAGNOSTIC DATA (LABS, IMAGING, TESTING) - I reviewed patient records, labs, notes, testing and imaging myself where available.  Lab Results  Component Value Date   WBC 5.8 04/01/2022   HGB 15.6 04/01/2022   HCT 46.0 04/01/2022  MCV 90.6 04/01/2022   PLT 188 04/01/2022      Component Value Date/Time   NA 141 04/01/2022 1608   K 3.7 04/01/2022 1608   CL 103 04/01/2022 1608   CO2 25 04/01/2022 1600   GLUCOSE 122 (H) 04/01/2022 1608   BUN 13 04/01/2022 1608   CREATININE 1.30 (H) 04/01/2022 1608   CALCIUM 9.5 04/01/2022 1600   PROT 7.1 04/01/2022 1600   ALBUMIN 4.2 04/01/2022 1600   AST 21 04/01/2022 1600   ALT 19 04/01/2022 1600   ALKPHOS 52 04/01/2022 1600   BILITOT 0.9 04/01/2022 1600   GFRNONAA >60 04/01/2022 1600   GFRAA >60 09/15/2019 2141   Lab Results  Component Value Date   CHOL 251 (H) 04/02/2022   HDL 65 04/02/2022   LDLCALC 128 (H) 04/02/2022   TRIG 292 (H) 04/02/2022   CHOLHDL 3.9 04/02/2022   Lab Results  Component Value Date   HGBA1C 5.9 (H) 04/01/2022      ASSESSMENT AND PLAN 66 y.o. year old male  has no past medical history on file. here with:  LEFT PCA infarct   Continue aspirin 81 mg daily and Brilinta (ticagrelor) 90 mg bid   for secondary stroke prevention.  Discussed secondary stroke prevention measures and  importance of close PCP follow up for aggressive stroke risk factor management. I have gone over the pathophysiology of stroke, warning signs and symptoms, risk factors and their management in some detail with instructions to go to the closest emergency room for symptoms of concern. HTN: BP goal <130/90.  Will keep a BP log for his PCP to see in Sept. HLD: LDL goal <70. Recent LDL 128 on lipitor 80 mg daily DMII: A1c goal<7.0. Recent A1c 5.9.  Encouraged patient to monitor diet and encouraged exercise Will discuss with OT about return to work date Okay to resume driving FU with our office PRN  Butch Penny, MSN, NP-C 05/09/2022, 8:46 AM [facility] [address], [address] [place], [place] [address] [phone]

## 2022-05-09 NOTE — Patient Instructions (Addendum)
Your Plan:  Continue aspirin 81 mg daily and Brilinta (ticagrelor) 90 mg  twice a day for 6 months then ASA alone Blood pressure goal <130/90 Cholesterol LDL goal <70 Diabetes goal A1c <7 Monitor diet and try to exercise   Thank you for coming to see [place] at [facility]. I hope we have been able to provide you high quality care today.  You may receive a patient satisfaction survey over the next few weeks. We would appreciate your feedback and comments so that we may continue to improve ourselves and the health of our patients.

## 2022-05-12 NOTE — Progress Notes (Signed)
I agree with the above plan 

## 2022-05-13 ENCOUNTER — Ambulatory Visit: Payer: Managed Care, Other (non HMO) | Admitting: Occupational Therapy

## 2022-05-13 ENCOUNTER — Encounter: Payer: Self-pay | Admitting: Occupational Therapy

## 2022-05-13 ENCOUNTER — Ambulatory Visit: Payer: Managed Care, Other (non HMO) | Admitting: Speech Pathology

## 2022-05-13 DIAGNOSIS — I69318 Other symptoms and signs involving cognitive functions following cerebral infarction: Secondary | ICD-10-CM

## 2022-05-13 DIAGNOSIS — R41842 Visuospatial deficit: Secondary | ICD-10-CM

## 2022-05-13 DIAGNOSIS — R41841 Cognitive communication deficit: Secondary | ICD-10-CM

## 2022-05-13 DIAGNOSIS — R278 Other lack of coordination: Secondary | ICD-10-CM

## 2022-05-13 DIAGNOSIS — I69351 Hemiplegia and hemiparesis following cerebral infarction affecting right dominant side: Secondary | ICD-10-CM

## 2022-05-13 NOTE — Therapy (Unsigned)
OUTPATIENT OCCUPATIONAL THERAPY TREATMENT SESSION & DISCHARGE SUMMARY  Patient Name: Vincent Oconnell MRN: [id number] DOB:1956/01/03, 66 y.o., male Today's Date: 05/13/2022  PCP: Not on file REFERRING PROVIDER: August Albino, NP    OT End of Session - 05/13/22 1705     Visit Number 6    Date for OT Re-Evaluation 07/08/22    Authorization Type [employer]    Authorization Time Period VL: 30 combined OT/ST    Authorization - Visit Number 11    Authorization - Number of Visits 30    OT Start Time 1700    OT Stop Time 1740    OT Time Calculation (min) 40 min    Activity Tolerance Patient tolerated treatment well    Behavior During Therapy WFL for tasks assessed/performed            OCCUPATIONAL THERAPY DISCHARGE SUMMARY  Visits from Start of Care: 6  Current functional level related to goals / functional outcomes: Pt reports he is able to complete all functional activities w/ at least Mod Ind; see goals below   Remaining deficits: Limitations w/ executive functioning, memory and attention; suspected R visual field deficit (to be assessed by ophthalmology)   Education / Equipment: Thorough condition-specific education; secondary stroke prevention and symptom recognition; HEP; visual compensatory strategies  Patient agrees to discharge. Patient goals were met. Patient is being discharged due to meeting the stated rehab goals.    History reviewed. No pertinent past medical history.  Past Surgical History:  Procedure Laterality Date   IR ANGIO VERTEBRAL SEL VERTEBRAL UNI L MOD SED  04/03/2022   IR CT HEAD LTD  04/02/2022   IR CT HEAD LTD  04/02/2022   IR PERCUTANEOUS ART THROMBECTOMY/INFUSION INTRACRANIAL INC DIAG ANGIO  04/01/2022   IR PERCUTANEOUS ART THROMBECTOMY/INFUSION INTRACRANIAL INC DIAG ANGIO  04/02/2022   IR US GUIDE VASC ACCESS RIGHT  04/01/2022   IR US GUIDE VASC ACCESS RIGHT  04/02/2022   LOOP RECORDER INSERTION N/A 04/04/2022   Procedure: LOOP RECORDER INSERTION;   Surgeon: Vickie Epley, MD;  Location: [facility];  Service: Cardiovascular;  Laterality: N/A;   RADIOLOGY WITH ANESTHESIA N/A 04/01/2022   Procedure: RADIOLOGY WITH ANESTHESIA;  Surgeon: Radiologist, Medication, MD;  Location: [facility] [facility];  Service: Radiology;  Laterality: N/A;   Patient Active Problem List   Diagnosis Date Noted   Essential hypertension 04/04/2022   Hyperlipidemia 04/04/2022   Acute ischemic left PCA stroke ([place]) 04/01/2022    ONSET DATE: 04/01/22  REFERRING DIAG: U63.335 (ICD-10-CM) - Acute ischemic left PCA stroke (HCC)   THERAPY DIAG:  Hemiplegia and hemiparesis following cerebral infarction affecting right dominant side (HCC)  Other lack of coordination  Visuospatial deficit  Other symptoms and signs involving cognitive functions following cerebral infarction  Rationale for Evaluation and Treatment Rehabilitation  SUBJECTIVE:   SUBJECTIVE STATEMENT: Pt he had a follow up w/ neurology; has potential plans to return to work on 05/28/22 and reports he has been released to return to driving Pt accompanied by: self  PERTINENT HISTORY: Acute ischemic left PCA stroke due to left P2 occlusion of cryptogenic etiology s/p mechanical thrombectomy x2 needing rescue left PCA stent; loop recorder insertion 04/04/22; MRI indicated acute left PCA territory infarcts involving L thalamus, hippocampus, medial L temporal lobe and occipital lobe  PAIN: Are you having pain? 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  Dual Tasking Picking up 1" blocks w/ R hand and placing in a stack corresponding to the color called out using ClockYourself app; completed 20 blocks. OT graded task up to increase challenge on cognition and alternating attention during additional attempts. Activity targeted GMC/coordination, attention and alternating attention, and visual perception.    PATIENT EDUCATION: See treatment section above Person educated: Patient Education method: Explanation Education comprehension: verbalized understanding   HOME EXERCISE PROGRAM: Putty exercises, coordination activities (printed handout)   GOALS: Goals reviewed with patient? No  SHORT TERM GOALS: Target date: 05/17/22    Status:  1 Pt will demonstrate understanding of at least 2 visual perceptual compensatory strategies to incorporate into IADLs prn  Met - 04/25/22  2 Pt will demonstrate understanding of HEP designed for RUE GM and [facility] for improved participation in meal prep and work-related tasks  Met - 04/29/22    LONG TERM GOALS: Target date: 07/08/22    Status:  1 Pt will improve efficiency during functional fine motor tasks by improving time to complete 9-HPT w/ RUE by at least 8 sec  Baseline: Right: 41.93 sec; Left: 27.47 sec Met - 04/29/22: 28.21 sec  2 Pt will improve accuracy and overall participation in meal prep tasks as evidenced by completing PPT: Item 2 in 10 sec or less in preparation for return to work Baseline: not assessed at evaluation Met - 04/29/22:  10 sec  3 Pt will improve grip strength in R hand to at least 20 lbs for functional use in IADLs  Baseline: not assessed at evaluation due to medical restrictions Met - 04/29/22: 30+ lbs w/ RUE  4 Pt will perform physical and cognitive tasks simultaneously for at least 5 min with no errors to facilitate safety and independence w/ 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 Pt has shown improvements in all areas addressed w/ 3 of 3 STGs and 4 of 4 LTGs met. Pt reports no further functional concerns at this time, states he has been released to return to driving, and has plans to return to work next month. Remaining deficits w/ altered sensation as well as memory and recall. In preparation for d/c, OT reviewed provided education, HEP, and answered pt questions as able. Pt is appropriate for d/c from skilled OT to HEP at this time, reports he is satisfied with progress, and is currently agreeable to discharge plan.  PERFORMANCE DEFICITS in functional skills including coordination, dexterity, sensation, strength, FMC, GMC, mobility, balance, and UE functional use, cognitive skills including attention and memory.  IMPAIRMENTS are limiting patient from IADLs, work, and leisure.   COMORBIDITIES has no other co-morbidities that affects occupational performance. Patient will benefit from skilled OT to address above impairments and improve overall function.   PLAN: OT FREQUENCY: 1x/week (decreased frequency due to insurance-based VL)  OT DURATION: 12 weeks  PLANNED INTERVENTIONS: self care/ADL training, therapeutic exercise, therapeutic activity, neuromuscular re-education, functional mobility training, moist heat, cryotherapy, patient/family education, cognitive remediation/compensation, visual/perceptual remediation/compensation, and DME and/or  AE instructions  RECOMMENDED OTHER SERVICES: Receiving PT and ST services at this location  CONSULTED AND AGREED WITH PLAN OF CARE: Patient  PLAN FOR NEXT SESSION: D/C   Kathrine Cords, MSOT, OTR/L 05/13/22, 5:07 PM

## 2022-05-14 ENCOUNTER — Encounter: Payer: Self-pay | Admitting: Speech Pathology

## 2022-05-14 NOTE — Therapy (Signed)
OUTPATIENT SPEECH LANGUAGE PATHOLOGY TREATMENT NOTE   Patient Name: Vincent Oconnell MRN: [id number] DOB:26-Feb-1956, 66 y.o., male Today's Date: 05/14/2022  PCP: Not listed REFERRING PROVIDER: Mathews Argyle, NP  END OF SESSION:   End of Session - 05/14/22 1427     Visit Number 6    Number of Visits 17    Date for SLP Re-Evaluation 06/11/22    SLP Start Time 1615    SLP Stop Time  1700    SLP Time Calculation (min) 45 min    Activity Tolerance Patient tolerated treatment well             History reviewed. No pertinent past medical history. Past Surgical History:  Procedure Laterality Date   IR ANGIO VERTEBRAL SEL VERTEBRAL UNI L MOD SED  04/03/2022   IR CT HEAD LTD  04/02/2022   IR CT HEAD LTD  04/02/2022   IR PERCUTANEOUS ART THROMBECTOMY/INFUSION INTRACRANIAL INC DIAG ANGIO  04/01/2022   IR PERCUTANEOUS ART THROMBECTOMY/INFUSION INTRACRANIAL INC DIAG ANGIO  04/02/2022   IR US GUIDE VASC ACCESS RIGHT  04/01/2022   IR US GUIDE VASC ACCESS RIGHT  04/02/2022   LOOP RECORDER INSERTION N/A 04/04/2022   Procedure: LOOP RECORDER INSERTION;  Surgeon: Lanier Prude, MD;  Location: [facility];  Service: Cardiovascular;  Laterality: N/A;   RADIOLOGY WITH ANESTHESIA N/A 04/01/2022   Procedure: RADIOLOGY WITH ANESTHESIA;  Surgeon: Radiologist, Medication, MD;  Location: [facility];  Service: Radiology;  Laterality: N/A;   Patient Active Problem List   Diagnosis Date Noted   Essential hypertension 04/04/2022   Hyperlipidemia 04/04/2022   Acute ischemic left PCA stroke (HCC) 04/01/2022    ONSET DATE: 04/01/22  REFERRING DIAG: I63.9 (ICD-10-CM) - Cerebrovascular accident (CVA), unspecified mechanism (HCC)  THERAPY DIAG:  Cognitive communication deficit  Rationale for Evaluation and Treatment Rehabilitation  SUBJECTIVE: "I do everything around the house."  PAIN:  Are you having pain? No     OBJECTIVE:   TODAY'S TREATMENT:  05/13/22: Skilled ST services targeted  comprehension and attention this session. Pt was asked to complete a "Employee Work Schedule" task. A larger calendar was given to him to assist with vision. Pt required modA to complete the task. Task should take an avg of 10-15 minutes and pt finished in 35 minutes with several errors. Pt continues to exhibit decreased awareness of deficits. SLP explained that with his job, he is going to have to manage several different components - this task should have been "easy-to-moderate" to complete. Pt reports he was surprised by amount of errors made; however, OT reported pt then told her that "he is not good at puzzles" and proceeded to tell her that he will be fine at work. OT explained that this task was NOT a puzzle, but a task demonstrating the skills needed to return to work.   Pt reports neurologist told him he was able to drive and return to work.   05/06/22: Skilled ST services targeted attention this session. Provided continued education on attention strategies. Pt continues to report, "I already do these things". Pt is not able to recall complex information provided to him verbally. He has continued to ask the same questions and tell the same stories. Pt brought up his sensation in his arm. SLP explained that OT is the therapy that addresses this. Pt reports OT has not told him how sensation works in his arm. SLP knows that pt has been told this information several times, as she has heard  OT explain this to patient. SLP made pt aware that his memory is impacted. He would benefit from using strategies. SLP provided him with pencil and paper to write down information provided by OT in the next session. He needed several repetitions and to be told what to write down. We will cont to work on strategies to increase his memory and awareness of deficits.   04/29/22: Skilled ST services targeted attention this session. Pt reports he feels that he is not having trouble. He had several questions about returning to  work. Currently, SLP is most concerned about attention and memory. SLP explained that the doctor is the person that releases patients back to work; however, in terms of cognitive skills, SLP suspects pt will be ready to trial work around late [facility] Oct. Pt continues to require redirection or repetition of information to adequately recall verbal information. Pt to complete edu on attention strategies next session.   04/25/22: Skilled ST services targeted attention this session. Pt reports he would like to return to work in mid September. Began education on attention strategies this session. Pt required minA to provide accurate examples of each attention type. Benefited from repetition of information and additional explanation related to job tasks. Pt reports he does not currently suffer from any fatigue. Will cont attention strategies next session.   04/16/22: Completed CLQT this session and provided cog-comm handout for edu on diagnosis.    Personal Facts: 7/8 Symbol Cancellation: 11/12 ; pt self corrected 5 errors Confrontation Naming: 10/10 Clock Drawing: 12//13 Story Retelling: 5/10 Symbol Trails: 5/10 Generative Naming: 5/9 Design Memory 6/6 Mazes: 4/8 Design Generation 5/13   OVERALL SCORE: MILD  Attention, Memory, Exec Function, Language, Visuospatial (ALL MILD)        PATIENT EDUCATION: Education details: Cog comm impairment Person educated: Patient Education method: Explanation Education comprehension: verbalized understanding and needs further education         GOALS: Goals reviewed with patient? Yes   SHORT TERM GOALS: Target date: 05/08/2022     Pt will increase auditory comprehension by recalling and verbalizing strategies to assist with active listening during conversation with minA. Baseline: Goal status: ONGOING   2.  Pt will comprehend functional memory or visual aids for recall of important information during structured conversations with minA. Baseline:   Goal status: ONGOING     LONG TERM GOALS: Target date: 06/05/2022       Pt will comprehend functional memory or visual aids for recall of important information during unstructured conversations independently. Baseline:  Goal status: ONGOING   2.  Pt will increase auditory comprehension by recalling and verbalizing strategies to assist with active listening during unstructured conversation independently. Baseline:  Goal status:ONGOING   ASSESSMENT:   CLINICAL IMPRESSION: See tx note.Cont with current POC.  SLP rec skilled ST services to address cognitive-communication impairment to maximize functional communication and increase pt ability to return to work.    OBJECTIVE IMPAIRMENTS include attention, memory, awareness, and executive functioning. These impairments are limiting patient from return to work and effectively communicating at home and in community. Factors affecting potential to achieve goals and functional outcome are  NA .Marland Kitchen Patient will benefit from skilled SLP services to address above impairments and improve overall function.   REHAB POTENTIAL: Good   PLAN: SLP FREQUENCY: 1-2x/week   SLP DURATION: 8 weeks   PLANNED INTERVENTIONS: Environmental controls, Cueing hierachy, Cognitive reorganization, Internal/external aids, Functional tasks, SLP instruction and feedback, Compensatory strategies, and Patient/family education    [place]  L Renette Butters, CCC-SLP 05/14/2022, 2:28 PM

## 2022-05-21 ENCOUNTER — Ambulatory Visit: Payer: Managed Care, Other (non HMO) | Admitting: Occupational Therapy

## 2022-05-21 ENCOUNTER — Ambulatory Visit: Payer: Managed Care, Other (non HMO) | Attending: Nurse Practitioner | Admitting: Speech Pathology

## 2022-05-21 ENCOUNTER — Telehealth: Payer: Self-pay | Admitting: Adult Health

## 2022-05-21 ENCOUNTER — Encounter: Payer: Self-pay | Admitting: Speech Pathology

## 2022-05-21 DIAGNOSIS — R41841 Cognitive communication deficit: Secondary | ICD-10-CM | POA: Insufficient documentation

## 2022-05-21 NOTE — Telephone Encounter (Signed)
Okay to write letter.  Please let the patient know as well that after our visit I did reach out to Occupational Therapy but they never responded to my message

## 2022-05-21 NOTE — Therapy (Signed)
OUTPATIENT SPEECH LANGUAGE PATHOLOGY TREATMENT NOTE   Patient Name: Vincent Oconnell MRN: [id number] DOB:04-04-1956, 66 y.o., male Today's Date: 05/21/2022  PCP: Not listed REFERRING PROVIDER: Mathews Argyle, NP  END OF SESSION:   End of Session - 05/21/22 0847     Visit Number 7    Number of Visits 17    Date for SLP Re-Evaluation 06/11/22    SLP Start Time 0845    SLP Stop Time  0925    SLP Time Calculation (min) 40 min    Activity Tolerance Patient tolerated treatment well             History reviewed. No pertinent past medical history. Past Surgical History:  Procedure Laterality Date   IR ANGIO VERTEBRAL SEL VERTEBRAL UNI L MOD SED  04/03/2022   IR CT HEAD LTD  04/02/2022   IR CT HEAD LTD  04/02/2022   IR PERCUTANEOUS ART THROMBECTOMY/INFUSION INTRACRANIAL INC DIAG ANGIO  04/01/2022   IR PERCUTANEOUS ART THROMBECTOMY/INFUSION INTRACRANIAL INC DIAG ANGIO  04/02/2022   IR US GUIDE VASC ACCESS RIGHT  04/01/2022   IR US GUIDE VASC ACCESS RIGHT  04/02/2022   LOOP RECORDER INSERTION N/A 04/04/2022   Procedure: LOOP RECORDER INSERTION;  Surgeon: Lanier Prude, MD;  Location: [facility];  Service: Cardiovascular;  Laterality: N/A;   RADIOLOGY WITH ANESTHESIA N/A 04/01/2022   Procedure: RADIOLOGY WITH ANESTHESIA;  Surgeon: Radiologist, Medication, MD;  Location: [facility];  Service: Radiology;  Laterality: N/A;   Patient Active Problem List   Diagnosis Date Noted   Essential hypertension 04/04/2022   Hyperlipidemia 04/04/2022   Acute ischemic left PCA stroke (HCC) 04/01/2022    ONSET DATE: 04/01/22  REFERRING DIAG: I63.9 (ICD-10-CM) - Cerebrovascular accident (CVA), unspecified mechanism (HCC)  THERAPY DIAG: Cognitive communication deficit  Rationale for Evaluation and Treatment Rehabilitation  SUBJECTIVE: "I do everything around the house."  PAIN:  Are you having pain? No     OBJECTIVE:   TODAY'S TREATMENT:  05/21/22: Pt reports he would like to go back to  work and feels able to do so. SLP suspects pt will struggle with tasks related to memory when he goes back to work and would advise waiting until strategies are educated and implemented.  Pt brought back HEP completed. He made two, small errors; however, was able to go back, find errors, and correct independently.    Began education on memory strategies this session. Pt reports he does not use any external strategies currently and could benefit from several. Pt to buy a planner and begin completing to-do lists at home. Pt to bring in examples next session.    05/13/22: Skilled ST services targeted comprehension and attention this session. Pt was asked to complete a "Employee Work Schedule" task. A larger calendar was given to him to assist with vision. Pt required modA to complete the task. Task should take an avg of 10-15 minutes and pt finished in 35 minutes with several errors. Pt continues to exhibit decreased awareness of deficits. SLP explained that with his job, he is going to have to manage several different components - this task should have been "easy-to-moderate" to complete. Pt reports he was surprised by amount of errors made; however, OT reported pt then told her that "he is not good at puzzles" and proceeded to tell her that he will be fine at work. OT explained that this task was NOT a puzzle, but a task demonstrating the skills needed to return to work.  Pt reports neurologist told him he was able to drive and return to work.   05/06/22: Skilled ST services targeted attention this session. Provided continued education on attention strategies. Pt continues to report, "I already do these things". Pt is not able to recall complex information provided to him verbally. He has continued to ask the same questions and tell the same stories. Pt brought up his sensation in his arm. SLP explained that OT is the therapy that addresses this. Pt reports OT has not told him how sensation works in his  arm. SLP knows that pt has been told this information several times, as she has heard OT explain this to patient. SLP made pt aware that his memory is impacted. He would benefit from using strategies. SLP provided him with pencil and paper to write down information provided by OT in the next session. He needed several repetitions and to be told what to write down. We will cont to work on strategies to increase his memory and awareness of deficits.   04/29/22: Skilled ST services targeted attention this session. Pt reports he feels that he is not having trouble. He had several questions about returning to work. Currently, SLP is most concerned about attention and memory. SLP explained that the doctor is the person that releases patients back to work; however, in terms of cognitive skills, SLP suspects pt will be ready to trial work around late [facility] Oct. Pt continues to require redirection or repetition of information to adequately recall verbal information. Pt to complete edu on attention strategies next session.   04/25/22: Skilled ST services targeted attention this session. Pt reports he would like to return to work in mid September. Began education on attention strategies this session. Pt required minA to provide accurate examples of each attention type. Benefited from repetition of information and additional explanation related to job tasks. Pt reports he does not currently suffer from any fatigue. Will cont attention strategies next session.   04/16/22: Completed CLQT this session and provided cog-comm handout for edu on diagnosis.    Personal Facts: 7/8 Symbol Cancellation: 11/12 ; pt self corrected 5 errors Confrontation Naming: 10/10 Clock Drawing: 12//13 Story Retelling: 5/10 Symbol Trails: 5/10 Generative Naming: 5/9 Design Memory 6/6 Mazes: 4/8 Design Generation 5/13   OVERALL SCORE: MILD  Attention, Memory, Exec Function, Language, Visuospatial (ALL MILD)        PATIENT  EDUCATION: Education details: Cog comm impairment Person educated: Patient Education method: Explanation Education comprehension: verbalized understanding and needs further education         GOALS: Goals reviewed with patient? Yes   SHORT TERM GOALS: Target date: 05/08/2022     Pt will increase auditory comprehension by recalling and verbalizing strategies to assist with active listening during conversation with minA. Baseline: Goal status: ONGOING   2.  Pt will comprehend functional memory or visual aids for recall of important information during structured conversations with minA. Baseline:  Goal status: ONGOING     LONG TERM GOALS: Target date: 06/05/2022       Pt will comprehend functional memory or visual aids for recall of important information during unstructured conversations independently. Baseline:  Goal status: ONGOING   2.  Pt will increase auditory comprehension by recalling and verbalizing strategies to assist with active listening during unstructured conversation independently. Baseline:  Goal status:ONGOING   ASSESSMENT:   CLINICAL IMPRESSION: See tx note.Cont with current POC.  SLP rec skilled ST services to address cognitive-communication impairment to maximize functional  communication and increase pt ability to return to work.    OBJECTIVE IMPAIRMENTS include attention, memory, awareness, and executive functioning. These impairments are limiting patient from return to work and effectively communicating at home and in community. Factors affecting potential to achieve goals and functional outcome are  NA .Marland Kitchen Patient will benefit from skilled SLP services to address above impairments and improve overall function.   REHAB POTENTIAL: Good   PLAN: SLP FREQUENCY: 1-2x/week   SLP DURATION: 8 weeks   PLANNED INTERVENTIONS: Environmental controls, Cueing hierachy, Cognitive reorganization, Internal/external aids, Functional tasks, SLP instruction and feedback,  Compensatory strategies, and Patient/family education    [place], CCC-SLP 05/21/2022, 8:48 AM

## 2022-05-21 NOTE — Telephone Encounter (Signed)
Pt states he needs a letter stating he can go back to work. Pt states he is going back to work 05/28/2022 and would like to have to he letter by then. Would like the letter to be emailed to [email or web address] , if this is a problem pt would like a call.

## 2022-05-22 ENCOUNTER — Encounter: Payer: Self-pay | Admitting: *Deleted

## 2022-05-22 NOTE — Telephone Encounter (Signed)
Spoke with patient and let him know we will email him the letter today. Also advised that [facility] NP reached out to OT after their last visit but they have not responded to her message. Pt verbalized understanding and stated he saw OT yesterday and will see them again next Monday. He will let them know. He verbalized appreciation.

## 2022-05-22 NOTE — Telephone Encounter (Signed)
Letter written and ready for MM NP's signature.

## 2022-05-22 NOTE — Telephone Encounter (Signed)
Letter has been emailed securely to [email or web address] per pt request.

## 2022-05-22 NOTE — Telephone Encounter (Signed)
signed

## 2022-05-27 ENCOUNTER — Ambulatory Visit: Payer: Managed Care, Other (non HMO) | Admitting: Speech Pathology

## 2022-05-27 ENCOUNTER — Ambulatory Visit: Payer: Managed Care, Other (non HMO) | Admitting: Occupational Therapy

## 2022-06-03 ENCOUNTER — Ambulatory Visit: Payer: Managed Care, Other (non HMO) | Admitting: Occupational Therapy

## 2022-06-03 ENCOUNTER — Ambulatory Visit: Payer: Managed Care, Other (non HMO) | Admitting: Speech Pathology

## 2022-06-03 NOTE — Progress Notes (Signed)
[facility] Inserted Loop recorder

## 2022-06-06 ENCOUNTER — Ambulatory Visit (INDEPENDENT_AMBULATORY_CARE_PROVIDER_SITE_OTHER): Payer: Managed Care, Other (non HMO)

## 2022-06-06 DIAGNOSIS — I63532 Cerebral infarction due to unspecified occlusion or stenosis of left posterior cerebral artery: Secondary | ICD-10-CM

## 2022-06-06 LAB — CUP PACEART REMOTE DEVICE CHECK
Date Time Interrogation Session: 20230921020044
Implantable Pulse Generator Implant Date: 20230720
Pulse Gen Serial Number: 511012320

## 2022-06-17 NOTE — Progress Notes (Signed)
[facility] Summary Report / Loop Recorder 

## 2022-06-22 ENCOUNTER — Other Ambulatory Visit (HOSPITAL_COMMUNITY): Payer: Self-pay

## 2022-07-08 ENCOUNTER — Ambulatory Visit (INDEPENDENT_AMBULATORY_CARE_PROVIDER_SITE_OTHER): Payer: Managed Care, Other (non HMO)

## 2022-07-08 DIAGNOSIS — I63532 Cerebral infarction due to unspecified occlusion or stenosis of left posterior cerebral artery: Secondary | ICD-10-CM | POA: Diagnosis not present

## 2022-07-09 ENCOUNTER — Other Ambulatory Visit: Payer: Self-pay

## 2022-07-09 LAB — CUP PACEART REMOTE DEVICE CHECK
Date Time Interrogation Session: 20231022020340
Implantable Pulse Generator Implant Date: 20230720
Pulse Gen Serial Number: 511012320

## 2022-07-09 NOTE — Patient Outreach (Signed)
First telephone outreach attempt to obtain mRS. No answer. Left message for returned call.    [facility] Management Assistant [phone]  

## 2022-07-16 ENCOUNTER — Other Ambulatory Visit: Payer: Self-pay

## 2022-07-16 NOTE — Patient Outreach (Signed)
Second telephone outreach attempt to obtain mRS. No answer. Left message for returned call.    [facility] Management Assistant [phone]  

## 2022-07-18 ENCOUNTER — Other Ambulatory Visit: Payer: Self-pay

## 2022-07-18 NOTE — Patient Outreach (Signed)
3 outreach attempts were completed to obtain mRs. mRs could not be obtained because patient never returned my calls. mRs=7      Care Management Assistant [phone]  

## 2022-07-19 ENCOUNTER — Other Ambulatory Visit: Payer: Self-pay

## 2022-07-19 NOTE — Patient Outreach (Signed)
Patient's wife called back and I was able to obtain mRS successfully. MRS= 1, updated notes.   [place] Management Assistant [phone]

## 2022-08-12 ENCOUNTER — Ambulatory Visit (INDEPENDENT_AMBULATORY_CARE_PROVIDER_SITE_OTHER): Payer: Managed Care, Other (non HMO)

## 2022-08-12 DIAGNOSIS — I63532 Cerebral infarction due to unspecified occlusion or stenosis of left posterior cerebral artery: Secondary | ICD-10-CM | POA: Diagnosis not present

## 2022-08-12 NOTE — Progress Notes (Signed)
[facility] Summary Report / Loop Recorder 

## 2022-08-13 LAB — CUP PACEART REMOTE DEVICE CHECK
Date Time Interrogation Session: 20231127022124
Implantable Pulse Generator Implant Date: 20230720
Pulse Gen Serial Number: 511012320

## 2022-08-26 ENCOUNTER — Emergency Department (HOSPITAL_COMMUNITY): Payer: Managed Care, Other (non HMO)

## 2022-08-26 ENCOUNTER — Emergency Department (HOSPITAL_COMMUNITY)
Admission: EM | Admit: 2022-08-26 | Discharge: 2022-08-27 | Discharge status: Against Medical Advice | Payer: Managed Care, Other (non HMO) | Attending: Medical | Admitting: Medical

## 2022-08-26 DIAGNOSIS — R200 Anesthesia of skin: Secondary | ICD-10-CM | POA: Diagnosis not present

## 2022-08-26 DIAGNOSIS — R1031 Right lower quadrant pain: Secondary | ICD-10-CM | POA: Diagnosis not present

## 2022-08-26 DIAGNOSIS — Z5321 Procedure and treatment not carried out due to patient leaving prior to being seen by health care provider: Secondary | ICD-10-CM | POA: Insufficient documentation

## 2022-08-26 LAB — COMPREHENSIVE METABOLIC PANEL
ALT: 112 U/L — ABNORMAL HIGH (ref 0–44)
AST: 92 U/L — ABNORMAL HIGH (ref 15–41)
Albumin: 3.9 g/dL (ref 3.5–5.0)
Alkaline Phosphatase: 121 U/L (ref 38–126)
Anion gap: 10 (ref 5–15)
BUN: 12 mg/dL (ref 8–23)
CO2: 24 mmol/L (ref 22–32)
Calcium: 9.2 mg/dL (ref 8.9–10.3)
Chloride: 103 mmol/L (ref 98–111)
Creatinine, Ser: 1.23 mg/dL (ref 0.61–1.24)
GFR, Estimated: >60 mL/min (ref >60)
Glucose, Bld: 103 mg/dL — ABNORMAL HIGH (ref 70–99)
Potassium: 3.9 mmol/L (ref 3.5–5.1)
Sodium: 137 mmol/L (ref 135–145)
Total Bilirubin: 0.4 mg/dL (ref 0.3–1.2)
Total Protein: 7.4 g/dL (ref 6.5–8.1)

## 2022-08-26 LAB — CBC
HCT: 44.3 % (ref 39.0–52.0)
Hemoglobin: 14.8 g/dL (ref 13.0–17.0)
MCH: 30.3 pg (ref 26.0–34.0)
MCHC: 33.4 g/dL (ref 30.0–36.0)
MCV: 90.6 fL (ref 80.0–100.0)
Platelets: 206 10*3/uL (ref 150–400)
RBC: 4.89 MIL/uL (ref 4.22–5.81)
RDW: 12.3 % (ref 11.5–15.5)
WBC: 5.1 10*3/uL (ref 4.0–10.5)
nRBC: 0 % (ref 0.0–0.2)

## 2022-08-26 LAB — TROPONIN I (HIGH SENSITIVITY): Troponin I (High Sensitivity): 4 ng/L (ref <18)

## 2022-08-26 NOTE — ED Triage Notes (Addendum)
Pt having numbness in hand and shoulder and upper side (ribs) on R side. Reports it is baseline in hand but it has gotten worse and new in shoulder and side. Today it seemed to worsen around 4:30. Pt takes Brillenta and aspirin. Also has implanted cardiac monitor. No alerts

## 2022-08-26 NOTE — ED Provider Triage Note (Addendum)
Emergency Medicine Provider Triage Evaluation Note  Vincent Oconnell , a 65 y.o. male  was evaluated in triage.  Pt complains of right sided numbness, tingling of RUE since 5PM today. Denies any changes of speech, confusion, headache. No N/V, chest pain or SOB. C/o of right flank pain that "feels like something is inside me."  Review of Systems  Positive: Right flank pain Negative: Vision changes  Physical Exam  BP (!) 141/103   Pulse 76   Temp 98.2 F (36.8 C)   Resp 16   SpO2 100%  Gen:   Awake, no distress   Resp:  Normal effort  MSK:   Moves extremities without difficulty  Other:  5/5 strength of BUE and BLE, no neurodeficits, sensation intact, no differences in sensation  Medical Decision Making  Medically screening exam initiated at 8:43 PM.  Appropriate orders placed.  Vincent Oconnell was informed that the remainder of the evaluation will be completed by another provider, this initial triage assessment does not replace that evaluation, and the importance of remaining in the ED until their evaluation is complete.  NIH 0   ,  Vincent Ewings, PA 08/26/22 2045    Vincent Oconnell, [place] 08/26/22 2049

## 2022-08-27 NOTE — ED Notes (Signed)
Pt name called x3 times no response and not seen in lobby

## 2022-09-11 ENCOUNTER — Ambulatory Visit (INDEPENDENT_AMBULATORY_CARE_PROVIDER_SITE_OTHER): Payer: Managed Care, Other (non HMO)

## 2022-09-11 DIAGNOSIS — I63532 Cerebral infarction due to unspecified occlusion or stenosis of left posterior cerebral artery: Secondary | ICD-10-CM

## 2022-09-12 LAB — CUP PACEART REMOTE DEVICE CHECK
Date Time Interrogation Session: 20231227020850
Implantable Pulse Generator Implant Date: 20230720
Pulse Gen Serial Number: 511012320

## 2022-09-19 NOTE — Progress Notes (Signed)
[facility] Loop Recorder  

## 2022-09-23 DIAGNOSIS — M25562 Pain in left knee: Secondary | ICD-10-CM | POA: Insufficient documentation

## 2022-09-24 ENCOUNTER — Encounter: Payer: Self-pay | Admitting: Physician Assistant

## 2022-09-24 ENCOUNTER — Other Ambulatory Visit: Payer: Self-pay | Admitting: Physician Assistant

## 2022-09-24 DIAGNOSIS — M25562 Pain in left knee: Secondary | ICD-10-CM

## 2022-10-03 NOTE — Progress Notes (Signed)
[facility] Summary Report / Loop Recorder

## 2022-10-09 ENCOUNTER — Other Ambulatory Visit: Payer: Self-pay | Admitting: Radiology

## 2022-10-09 DIAGNOSIS — I771 Stricture of artery: Secondary | ICD-10-CM

## 2022-10-10 ENCOUNTER — Other Ambulatory Visit: Payer: Self-pay | Admitting: Student

## 2022-10-10 ENCOUNTER — Ambulatory Visit (INDEPENDENT_AMBULATORY_CARE_PROVIDER_SITE_OTHER): Payer: Managed Care, Other (non HMO)

## 2022-10-10 DIAGNOSIS — I63532 Cerebral infarction due to unspecified occlusion or stenosis of left posterior cerebral artery: Secondary | ICD-10-CM

## 2022-10-10 LAB — CUP PACEART REMOTE DEVICE CHECK
Date Time Interrogation Session: 20240125021855
Implantable Pulse Generator Implant Date: 20230720
Pulse Gen Serial Number: 511012320

## 2022-10-10 NOTE — H&P (Incomplete)
Chief Complaint: Left PCA with occlusion of the proximal left P2 segment. Patient presents for cerebral angiogram with intervention  Referring Physician(s): Han,Aimee H  Supervising Physician: {Supervising Physician:[id number]}  Patient Status: {IR Consult Patient Status:21879}  History of Present Illness: Vincent Oconnell is a 67 y.o. male outpatient. History of arthritis. Presented to the ED in July 2023 with right sided  dizziness, right hemianopia, dysarthria and right sided weakness. CT  at the time showed no infarct or hemorrhage. Dr. Harrold Donath [facility] Attending performed a cerebral angiogram  on 7.17.23 found a fetal left PCA with occlusion of the proximal left P2 segment. Patient re-occluded overnight. On 7.18.23. Dr. Harrold Donath performed a second aspiration. Per procedure notes  minimal recanalization (TICI 1). Cangrelor load dose administered and a 4 x 24 mm neuroform atlas stent was deployed across the occlusion with complete recanalization.  Patient presented to the ED at [facility] on 12.11.23 with right arm numbness.  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 Patient presents for left PCA thrombectomy and stent placement   Currently without any significant complaints. Patient alert and laying in bed,calm. Denies any fevers, headache, chest pain, SOB, cough, abdominal pain, nausea, vomiting or bleeding. Return precautions and treatment recommendations and follow-up discussed with the patient  who is agreeable with the plan.    No past medical history on file.  Past Surgical History:  Procedure Laterality Date   IR ANGIO VERTEBRAL SEL VERTEBRAL UNI L MOD SED  04/03/2022   IR CT HEAD LTD  04/02/2022   IR CT HEAD LTD  04/02/2022   IR PERCUTANEOUS ART THROMBECTOMY/INFUSION INTRACRANIAL INC DIAG ANGIO  04/01/2022   IR PERCUTANEOUS ART THROMBECTOMY/INFUSION INTRACRANIAL INC DIAG ANGIO  04/02/2022   IR US GUIDE VASC ACCESS RIGHT  04/01/2022   IR  US GUIDE VASC ACCESS RIGHT  04/02/2022   LOOP RECORDER INSERTION N/A 04/04/2022   Procedure: LOOP RECORDER INSERTION;  Surgeon: Vickie Epley, MD;  Location: [facility];  Service: Cardiovascular;  Laterality: N/A;   RADIOLOGY WITH ANESTHESIA N/A 04/01/2022   Procedure: RADIOLOGY WITH ANESTHESIA;  Surgeon: Radiologist, Medication, MD;  Location: [facility];  Service: Radiology;  Laterality: N/A;    Allergies: Other  Medications: Prior to Admission medications   Medication Sig Start Date End Date Taking? Authorizing Provider  aspirin 81 MG chewable tablet Chew 1 tablet (81 mg total) by mouth daily. 04/05/22   August Albino, NP  atorvastatin (LIPITOR) 80 MG tablet Take 1 tablet (80 mg total) by mouth daily. 04/05/22   August Albino, NP  Multiple Vitamins-Minerals (MULTIVITAMIN MEN 50+) TABS Take 1 tablet by mouth daily. Patient not taking: Reported on 05/09/2022    [provider]  ticagrelor (BRILINTA) 90 MG TABS tablet Take 1 tablet (90 mg total) by mouth 2 (two) times daily. 04/04/22   August Albino, NP     No family history on file.  Social History   Socioeconomic History   Marital status: Married    Spouse name: Not on file   Number of children: Not on file   Years of education: Not on file   Highest education level: Not on file  Occupational History   Not on file  Tobacco Use   Smoking status: Never   Smokeless tobacco: Never  Substance and Sexual Activity   Alcohol use: No   Drug use: Not on file   Sexual activity: Not on file  Other Topics Concern   Not on file  Social History Narrative   Not on file   Social Determinants of Health   Financial Resource Strain: Not on file  Food Insecurity: Not on file  Transportation Needs: Not on file  Physical Activity: Not on file  Stress: Not on file  Social Connections: Not on file    ECOG Status: {CHL ONC ECOG HY:[id number]}  Review of Systems: A 12 point ROS discussed and pertinent positives are  indicated in the HPI above.  All other systems are negative.  Review of Systems  Vital Signs: There were no vitals taken for this visit.  Advance Care Plan: {Advance Care XBMW:41324}    Physical Exam  Imaging: CUP PACEART REMOTE DEVICE CHECK  Result Date: 10/10/2022 ILR summary report received. Battery status OK. Normal device function. No new symptom, tachy, brady, or pause episodes. No new AF episodes.  AF burden is 0% of the time.  Monthly summary reports and ROV/PRN Kathy Breach, RN, CCDS, [employer]  CUP PACEART REMOTE DEVICE CHECK  Result Date: 09/12/2022 ILR summary report received. Battery status OK. Normal device function. No new symptom, tachy, brady, or pause episodes. No new AF episodes. Monthly summary reports and ROV/PRN LA   Labs:  CBC: Recent Labs    04/01/22 1600 04/01/22 1608 08/26/22 2059  WBC 5.8  --  5.1  HGB 14.8 15.6 14.8  HCT 44.5 46.0 44.3  PLT 188  --  206    COAGS: Recent Labs    04/01/22 1600  INR 1.0  APTT 25    BMP: Recent Labs    04/01/22 1600 04/01/22 1608 08/26/22 2059  NA 142 141 137  K 3.8 3.7 3.9  CL 104 103 103  CO2 25  --  24  GLUCOSE 123* 122* 103*  BUN 12 13 12   CALCIUM 9.5  --  9.2  CREATININE 1.29* 1.30* 1.23  GFRNONAA >60  --  >60    LIVER FUNCTION TESTS: Recent Labs    04/01/22 1600 08/26/22 2059  BILITOT 0.9 0.4  AST 21 92*  ALT 19 112*  ALKPHOS 52 121  PROT 7.1 7.4  ALBUMIN 4.2 3.9     Assessment and Plan:  67 y.o. male History of arthritis. Presented to the ED in July 2023 with right sided  dizziness, right hemianopia, dysarthria and right sided weakness. CT  at the time showed no infarct or hemorrhage. Dr. Harrold Donath [facility] Attending performed a cerebral angiogram  on 7.17.23 found a fetal left PCA with occlusion of the proximal left P2 segment. Patient re-occluded overnight. On 7.18.23. Dr. Harrold Donath performed a second aspiration. Per procedure notes  minimal  recanalization (TICI 1). Cangrelor load dose administered and a 4 x 24 mm neuroform atlas stent was deployed across the occlusion with complete recanalization.  Patient presented to the ED at [facility] on 12.11.23 with right arm numbness.  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 Patient presents for left PCA thrombectomy and stent placement   Patient is on 81 ASA and Brillinta. No pertinent allergies. Patient has been NPO since midnight.   Risks and benefits of cerebral arteriogram with intervention were discussed with the patient including, but not limited to bleeding, infection, vascular injury, contrast induced renal failure, stroke, reperfusion hemorrhage, or even death. This interventional procedure involves the use of X-rays and because of the nature of the planned procedure, it is possible that  we will have prolonged use of X-ray fluoroscopy. Potential radiation risks to you include (but are not limited to) the following: - A slightly elevated risk for cancer  several years later in life. This risk is typically less than 0.5% percent. This risk is low in comparison to the normal incidence of human cancer, which is 33% for women and 50% for men according to the [employer]. - Radiation induced injury can include skin redness, resembling a rash, tissue breakdown / ulcers and hair loss (which can be temporary or permanent).  The likelihood of either of these occurring depends on the difficulty of the procedure and whether you are sensitive to radiation due to previous procedures, disease, or genetic conditions.  IF your procedure requires a prolonged use of radiation, you will be notified and given written instructions for further action.  It is your responsibility to monitor the irradiated area for the 2 weeks following the procedure and to notify your physician if you are concerned that you have suffered a radiation induced injury.   All of the patient's  questions were answered, patient is agreeable to proceed. Consent signed and in chart.   Thank you for this interesting consult.  I greatly enjoyed meeting Williard Keller and look forward to participating in their care.  A copy of this report was sent to the requesting provider on this date.  Electronically Signed: Alene Mires, NP 10/10/2022, 8:17 PM   I spent a total of  {New Out-Pt:[id number]}  {Established Out-Pt:[id number]} in face to face in clinical consultation, greater than 50% of which was counseling/coordinating care for ***

## 2022-10-11 ENCOUNTER — Ambulatory Visit (HOSPITAL_COMMUNITY)
Admission: RE | Admit: 2022-10-11 | Discharge: 2022-10-11 | Disposition: A | Discharge status: Home / Self Care | Payer: Managed Care, Other (non HMO) | Source: Ambulatory Visit | Attending: Student | Admitting: Student

## 2022-10-11 ENCOUNTER — Other Ambulatory Visit (HOSPITAL_COMMUNITY): Payer: Self-pay | Admitting: Student

## 2022-10-11 ENCOUNTER — Other Ambulatory Visit: Payer: Self-pay | Admitting: Radiology

## 2022-10-11 ENCOUNTER — Other Ambulatory Visit: Payer: Self-pay

## 2022-10-11 DIAGNOSIS — I639 Cerebral infarction, unspecified: Secondary | ICD-10-CM

## 2022-10-11 DIAGNOSIS — I63532 Cerebral infarction due to unspecified occlusion or stenosis of left posterior cerebral artery: Secondary | ICD-10-CM

## 2022-10-11 DIAGNOSIS — Z79899 Other long term (current) drug therapy: Secondary | ICD-10-CM | POA: Insufficient documentation

## 2022-10-11 DIAGNOSIS — Z7902 Long term (current) use of antithrombotics/antiplatelets: Secondary | ICD-10-CM | POA: Insufficient documentation

## 2022-10-11 DIAGNOSIS — Z7982 Long term (current) use of aspirin: Secondary | ICD-10-CM | POA: Diagnosis not present

## 2022-10-11 DIAGNOSIS — M25562 Pain in left knee: Secondary | ICD-10-CM

## 2022-10-11 DIAGNOSIS — I771 Stricture of artery: Secondary | ICD-10-CM

## 2022-10-11 DIAGNOSIS — I63512 Cerebral infarction due to unspecified occlusion or stenosis of left middle cerebral artery: Secondary | ICD-10-CM

## 2022-10-11 DIAGNOSIS — Z8673 Personal history of transient ischemic attack (TIA), and cerebral infarction without residual deficits: Secondary | ICD-10-CM | POA: Insufficient documentation

## 2022-10-11 HISTORY — PX: IR US GUIDE VASC ACCESS RIGHT: IMG2390

## 2022-10-11 HISTORY — PX: IR ANGIO VERTEBRAL SEL VERTEBRAL BILAT MOD SED: IMG5369

## 2022-10-11 HISTORY — PX: IR ANGIO INTRA EXTRACRAN SEL INTERNAL CAROTID BILAT MOD SED: IMG5363

## 2022-10-11 LAB — CBC
HCT: 44.4 % (ref 39.0–52.0)
Hemoglobin: 14.8 g/dL (ref 13.0–17.0)
MCH: 30.5 pg (ref 26.0–34.0)
MCHC: 33.3 g/dL (ref 30.0–36.0)
MCV: 91.5 fL (ref 80.0–100.0)
Platelets: 185 10*3/uL (ref 150–400)
RBC: 4.85 MIL/uL (ref 4.22–5.81)
RDW: 12.5 % (ref 11.5–15.5)
WBC: 7.4 10*3/uL (ref 4.0–10.5)
nRBC: 0 % (ref 0.0–0.2)

## 2022-10-11 LAB — PROTIME-INR
INR: 1 (ref 0.8–1.2)
Prothrombin Time: 13 seconds (ref 11.4–15.2)

## 2022-10-11 LAB — BASIC METABOLIC PANEL
Anion gap: 6 (ref 5–15)
BUN: 13 mg/dL (ref 8–23)
CO2: 30 mmol/L (ref 22–32)
Calcium: 9 mg/dL (ref 8.9–10.3)
Chloride: 102 mmol/L (ref 98–111)
Creatinine, Ser: 1.33 mg/dL — ABNORMAL HIGH (ref 0.61–1.24)
GFR, Estimated: 59 mL/min — ABNORMAL LOW (ref >60)
Glucose, Bld: 107 mg/dL — ABNORMAL HIGH (ref 70–99)
Potassium: 4.4 mmol/L (ref 3.5–5.1)
Sodium: 138 mmol/L (ref 135–145)

## 2022-10-11 MED ORDER — SODIUM CHLORIDE 0.9 % IV SOLN: Freq: Once | INTRAVENOUS | Status: AC

## 2022-10-11 MED ORDER — FENTANYL CITRATE (PF) 100 MCG/2ML IJ SOLN
INTRAMUSCULAR | Status: AC | PRN
  Administered 2022-10-11 (×2): 25 ug via INTRAVENOUS

## 2022-10-11 MED ORDER — MIDAZOLAM HCL 2 MG/2ML IJ SOLN
INTRAMUSCULAR | Status: AC | PRN
  Administered 2022-10-11: 1 mg via INTRAVENOUS
  Administered 2022-10-11: .5 mg via INTRAVENOUS

## 2022-10-11 MED ORDER — LIDOCAINE HCL 1 % IJ SOLN
INTRAMUSCULAR | Status: AC
  Filled 2022-10-11: qty 20

## 2022-10-11 MED ORDER — MIDAZOLAM HCL 2 MG/2ML IJ SOLN
INTRAMUSCULAR | Status: AC
  Filled 2022-10-11: qty 2

## 2022-10-11 MED ORDER — HEPARIN SODIUM (PORCINE) 1000 UNIT/ML IJ SOLN
INTRAMUSCULAR | Status: AC
  Filled 2022-10-11: qty 10

## 2022-10-11 MED ORDER — FENTANYL CITRATE (PF) 100 MCG/2ML IJ SOLN
INTRAMUSCULAR | Status: AC
  Filled 2022-10-11: qty 2

## 2022-10-11 MED ORDER — IOHEXOL 300 MG/ML  SOLN
150.0000 mL | Freq: Once | INTRAMUSCULAR | Status: AC | PRN
  Administered 2022-10-11: 70 mL via INTRA_ARTERIAL

## 2022-10-11 NOTE — Procedures (Signed)
INTERVENTIONAL NEURORADIOLOGY BRIEF POSTPROCEDURE NOTE  DIAGNOSTIC CEREBRAL ANGIOGRAM  Attending: Dr. Pedro Earls  Diagnosis: S/p left fetal PCA stenting  Access site: RCFA  Access closure: 25F exoseal  Anesthesia: Moderate sedation  Medication used: 1.5 mg Versed IV; 50 mcg Fentanyl IV.  Complications: None.  Estimated blood loss: None.  Specimen: None.  Findings: Status post stenting of a fetal left PCA. 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 There is brisk distal opacification.   The patient tolerated the procedure well without incident or complication and is in stable condition.   PLAN: Continue on dual anti-platelet with brilinta and aspirin for 6 more months.

## 2022-10-11 NOTE — H&P (Signed)
Chief Complaint: Patient was seen in consultation today for No chief complaint on file.  at the request of Han,Aimee H  Referring Physician(s): Han,Aimee H  Supervising Physician: Baldemar Lenis  Patient Status: [facility] - Out-pt  History of Present Illness: Vincent Oconnell is a 67 y.o. male with a history of left PCA infarct.  He underwent revascularization with mechanical thrombectomy and stenting.  Overall he feels that he is doing well. He states that his vision has returned to his baseline. Reports that in the right upper extremity he still feels some numbness but denies any significant weakness. He is currently taking aspirin and Brilinta. Remains on Lipitor for his cholesterol.    No past medical history on file.  Past Surgical History:  Procedure Laterality Date   IR ANGIO VERTEBRAL SEL VERTEBRAL UNI L MOD SED  04/03/2022   IR CT HEAD LTD  04/02/2022   IR CT HEAD LTD  04/02/2022   IR PERCUTANEOUS ART THROMBECTOMY/INFUSION INTRACRANIAL INC DIAG ANGIO  04/01/2022   IR PERCUTANEOUS ART THROMBECTOMY/INFUSION INTRACRANIAL INC DIAG ANGIO  04/02/2022   IR US GUIDE VASC ACCESS RIGHT  04/01/2022   IR US GUIDE VASC ACCESS RIGHT  04/02/2022   LOOP RECORDER INSERTION N/A 04/04/2022   Procedure: LOOP RECORDER INSERTION;  Surgeon: Lanier Prude, MD;  Location: [facility];  Service: Cardiovascular;  Laterality: N/A;   RADIOLOGY WITH ANESTHESIA N/A 04/01/2022   Procedure: RADIOLOGY WITH ANESTHESIA;  Surgeon: Radiologist, Medication, MD;  Location: [facility];  Service: Radiology;  Laterality: N/A;    Allergies: Other  Medications: Prior to Admission medications   Medication Sig Start Date End Date Taking? Authorizing Provider  aspirin 81 MG chewable tablet Chew 1 tablet (81 mg total) by mouth daily. 04/05/22  Yes Mathews Argyle, NP  atorvastatin (LIPITOR) 80 MG tablet Take 1 tablet (80 mg total) by mouth daily. 04/05/22  Yes Mathews Argyle, NP  Multiple Vitamins-Minerals  (MULTIVITAMIN MEN 50+) TABS Take 1 tablet by mouth daily.   Yes [provider]  ticagrelor (BRILINTA) 90 MG TABS tablet Take 1 tablet (90 mg total) by mouth 2 (two) times daily. 04/04/22  Yes Mathews Argyle, NP     No family history on file.  Social History   Socioeconomic History   Marital status: Married    Spouse name: Not on file   Number of children: Not on file   Years of education: Not on file   Highest education level: Not on file  Occupational History   Not on file  Tobacco Use   Smoking status: Never   Smokeless tobacco: Never  Substance and Sexual Activity   Alcohol use: No   Drug use: Not on file   Sexual activity: Not on file  Other Topics Concern   Not on file  Social History Narrative   Not on file   Social Determinants of Health   Financial Resource Strain: Not on file  Food Insecurity: Not on file  Transportation Needs: Not on file  Physical Activity: Not on file  Stress: Not on file  Social Connections: Not on file      Review of Systems: A 12 point ROS discussed and pertinent positives are indicated in the HPI above.  All other systems are negative.  Review of Systems  Genitourinary:  Positive for frequency.  Musculoskeletal:        Describes his right arm feeling like a brick from shoulder to forearm.  No functional limitation,  however.  All other systems reviewed and are negative.   Vital Signs: BP (!) 133/91   Pulse 63   Temp 98.3 F (36.8 C)   Resp 16   Ht 5\' 7"  (1.702 m)   Wt 180 lb (81.6 kg)   SpO2 99%   BMI 28.19 kg/m   Physical Exam Vitals reviewed.  Constitutional:      General: He is not in acute distress.    Appearance: Normal appearance. He is not ill-appearing.  HENT:     Head: Normocephalic and atraumatic.     Mouth/Throat:     Mouth: Mucous membranes are moist.     Pharynx: Oropharynx is clear.  Eyes:     General: No scleral icterus.    Extraocular Movements: Extraocular movements intact.   Cardiovascular:     Pulses: Normal pulses.     Heart sounds: Normal heart sounds.  Pulmonary:     Effort: Pulmonary effort is normal.     Breath sounds: Normal breath sounds.  Abdominal:     General: Abdomen is flat.     Palpations: Abdomen is soft.  Skin:    General: Skin is warm and dry.  Neurological:     General: No focal deficit present.     Mental Status: He is alert and oriented to person, place, and time.  Psychiatric:        Mood and Affect: Mood normal.        Behavior: Behavior normal.     Imaging: CUP PACEART REMOTE DEVICE CHECK  Result Date: 10/10/2022 ILR summary report received. Battery status OK. Normal device function. No new symptom, tachy, brady, or pause episodes. No new AF episodes.  AF burden is 0% of the time.  Monthly summary reports and ROV/PRN Kathy Breach, RN, CCDS, [employer]   Labs:  CBC: Recent Labs    04/01/22 1600 04/01/22 1608 08/26/22 2059 10/11/22 0712  WBC 5.8  --  5.1 7.4  HGB 14.8 15.6 14.8 14.8  HCT 44.5 46.0 44.3 44.4  PLT 188  --  206 185    COAGS: Recent Labs    04/01/22 1600 10/11/22 0712  INR 1.0 1.0  APTT 25  --     BMP: Recent Labs    04/01/22 1600 04/01/22 1608 08/26/22 2059 10/11/22 0712  NA 142 141 137 138  K 3.8 3.7 3.9 4.4  CL 104 103 103 102  CO2 25  --  24 30  GLUCOSE 123* 122* 103* 107*  BUN 12 13 12 13   CALCIUM 9.5  --  9.2 9.0  CREATININE 1.29* 1.30* 1.23 1.33*  GFRNONAA >60  --  >60 59*    LIVER FUNCTION TESTS: Recent Labs    04/01/22 1600 08/26/22 2059  BILITOT 0.9 0.4  AST 21 92*  ALT 19 112*  ALKPHOS 52 121  PROT 7.1 7.4  ALBUMIN 4.2 3.9    Assessment and Plan:  Status post Left PCA thrombectomy and stenting on 04/01/22 --has recovered well clinically and is back to work since September --heaviness and numbness remains in his RUE --vitals, imaging, labs, and medication reviewed --presents for diagnostic cerebral angiogram for surveillance and follow up, will  plan to proceed with anticipated discharge today  Risks and benefits of cerebral angiogram were discussed with the patient including, but not limited to bleeding, infection, vascular injury, contrast induced renal failure, stroke or even death.  This interventional procedure involves the use of X-rays and because of the nature of the  planned procedure, it is possible that we will have prolonged use of X-ray fluoroscopy.  Potential radiation risks to you include (but are not limited to) the following: - A slightly elevated risk for cancer several years later in life. This risk is typically less than 0.5% percent. This risk is low in comparison to the normal incidence of human cancer, which is 33% for women and 50% for men according to the [place]. - Radiation induced injury can include skin redness, resembling a rash, tissue breakdown / ulcers and hair loss (which can be temporary or permanent).   The likelihood of either of these occurring depends on the difficulty of the procedure and whether you are sensitive to radiation due to previous procedures, disease, or genetic conditions.   IF your procedure requires a prolonged use of radiation, you will be notified and given written instructions for further action.  It is your responsibility to monitor the irradiated area for the 2 weeks following the procedure and to notify your physician if you are concerned that you have suffered a radiation induced injury.    All of the patient's questions were answered, patient is agreeable to proceed.  Consent signed and in chart.   Thank you for this interesting consult.  I greatly enjoyed meeting Alecxander Mainwaring and look forward to participating in their care.  A copy of this report was sent to the requesting provider on this date.  Electronically Signed: Pasty Spillers, PA 10/11/2022, 8:18 AM   I spent a total of 15 Minutes in face to face in clinical consultation, greater than 50% of  which was counseling/coordinating care for cerebral angiogram

## 2022-10-11 NOTE — Discharge Instructions (Signed)
Femoral Site Care This sheet gives you information about how to care for yourself after your procedure. Your health care provider may also give you more specific instructions. If you have problems or questions, contact your health care provider. What can I expect after the procedure?  After the procedure, it is common to have: Bruising that usually fades within 1-2 weeks. Tenderness at the site. Follow these instructions at home: Wound care Follow instructions from your health care provider about how to take care of your insertion site. Make sure you: Wash your hands with soap and water before you change your bandage (dressing). If soap and water are not available, use hand sanitizer. Remove your dressing as told by your health care provider. In 24 hours Do not take baths, swim, or use a hot tub until your health care provider approves. You may shower 24-48 hours after the procedure or as told by your health care provider. Gently wash the site with plain soap and water. Pat the area dry with a clean towel. Do not rub the site. This may cause bleeding. Do not apply powder or lotion to the site. Keep the site clean and dry. Check your femoral site every day for signs of infection. Check for: Redness, swelling, or pain. Fluid or blood. Warmth. Pus or a bad smell. Activity For the first 2-3 days after your procedure, or as long as directed: Avoid climbing stairs as much as possible. Do not squat. Do not lift anything that is heavier than 10 lb (4.5 kg), or the limit that you are told, until your health care provider says that it is safe. For 5 days Rest as directed. Avoid sitting for a long time without moving. Get up to take short walks every 1-2 hours. Do not drive for 24 hours if you were given a medicine to help you relax (sedative). General instructions Take over-the-counter and prescription medicines only as told by your health care provider. Keep all follow-up visits as told by  your health care provider. This is important. Contact a health care provider if you have: A fever or chills. You have redness, swelling, or pain around your insertion site. Get help right away if: The catheter insertion area swells very fast. You pass out. You suddenly start to sweat or your skin gets clammy. The catheter insertion area is bleeding, and the bleeding does not stop when you hold steady pressure on the area. The area near or just beyond the catheter insertion site becomes pale, cool, tingly, or numb. These symptoms may represent a serious problem that is an emergency. Do not wait to see if the symptoms will go away. Get medical help right away. Call your local emergency services (911 in the [place] Do not drive yourself to the hospital. Summary After the procedure, it is common to have bruising that usually fades within 1-2 weeks. Check your femoral site every day for signs of infection. Do not lift anything that is heavier than 10 lb (4.5 kg), or the limit that you are told, until your health care provider says that it is safe. This information is not intended to replace advice given to you by your health care provider. Make sure you discuss any questions you have with your health care provider. Document Revised: 09/15/2017 Document Reviewed: 09/15/2017 Elsevier Patient Education  2020 [employer] 

## 2022-10-30 NOTE — Progress Notes (Signed)
[facility] Summary Report / Loop Recorder 

## 2022-11-11 ENCOUNTER — Ambulatory Visit: Payer: Managed Care, Other (non HMO)

## 2022-11-11 DIAGNOSIS — I63532 Cerebral infarction due to unspecified occlusion or stenosis of left posterior cerebral artery: Secondary | ICD-10-CM | POA: Diagnosis not present

## 2022-11-12 LAB — CUP PACEART REMOTE DEVICE CHECK
Date Time Interrogation Session: 20240225020815
Implantable Pulse Generator Implant Date: 20230720
Pulse Gen Serial Number: 511012320

## 2022-12-11 LAB — CUP PACEART REMOTE DEVICE CHECK
Date Time Interrogation Session: 20240327023347
Implantable Pulse Generator Implant Date: 20230720
Pulse Gen Serial Number: 511012320

## 2022-12-12 ENCOUNTER — Ambulatory Visit: Payer: Managed Care, Other (non HMO)

## 2022-12-12 DIAGNOSIS — I63532 Cerebral infarction due to unspecified occlusion or stenosis of left posterior cerebral artery: Secondary | ICD-10-CM | POA: Diagnosis not present

## 2022-12-23 NOTE — Progress Notes (Addendum)
[facility] Loop Recorder 

## 2022-12-27 ENCOUNTER — Emergency Department (HOSPITAL_COMMUNITY)
Admission: EM | Admit: 2022-12-27 | Discharge: 2022-12-27 | Disposition: A | Discharge status: Home / Self Care | Payer: Managed Care, Other (non HMO) | Attending: Emergency Medicine | Admitting: Emergency Medicine

## 2022-12-27 ENCOUNTER — Other Ambulatory Visit: Payer: Self-pay

## 2022-12-27 ENCOUNTER — Encounter (HOSPITAL_COMMUNITY): Payer: Self-pay

## 2022-12-27 DIAGNOSIS — Z7982 Long term (current) use of aspirin: Secondary | ICD-10-CM | POA: Diagnosis not present

## 2022-12-27 DIAGNOSIS — H1131 Conjunctival hemorrhage, right eye: Secondary | ICD-10-CM

## 2022-12-27 DIAGNOSIS — H5789 Other specified disorders of eye and adnexa: Secondary | ICD-10-CM | POA: Diagnosis present

## 2022-12-27 MED ORDER — ERYTHROMYCIN 5 MG/GM OP OINT: TOPICAL_OINTMENT | OPHTHALMIC | 0 refills | Status: DC

## 2022-12-27 MED ORDER — FLUORESCEIN SODIUM 1 MG OP STRP
1.0000 | ORAL_STRIP | Freq: Once | OPHTHALMIC | Status: DC
  Filled 2022-12-27: qty 1

## 2022-12-27 NOTE — ED Provider Triage Note (Signed)
Emergency Medicine Provider Triage Evaluation Note  Vincent Oconnell , a 67 y.o. male  was evaluated in triage.  Patient presenting today with a bloodshot right eye.  Started today.  He says that he always feels like there is something inside of his eye but he has not been touching or rubbing his eye.  No visual disturbance.  No known trauma.  Review of Systems  Positive:  Negative:   Physical Exam  BP (!) 148/83 (BP Location: Right Arm)   Pulse 78   Temp 98.2 F (36.8 C) (Oral)   Resp 16   Ht 5\' 8"  (1.727 m)   Wt 81.6 kg   SpO2 96%   BMI 27.37 kg/m  Gen:   Awake, no distress   Resp:  Normal effort  MSK:   Moves extremities without difficulty  Other:  Patient has a subconjunctival hemorrhage.  No signs of hyphema.  No visual disturbance.  Medical Decision Making  Medically screening exam initiated at 1:03 PM.  Appropriate orders placed.  Tarvaris Sly was informed that the remainder of the evaluation will be completed by another provider, this initial triage assessment does not replace that evaluation, and the importance of remaining in the ED until their evaluation is complete.    Originally was going to discharge the patient from triage with a subconjunctival hemorrhage however patient reported the feeling as though there is something in his eye which has been a chronic problem.  Believe it is reasonable to do a fluorescein exam so patient will wait for space in the back of the department.   Saddie Benders, PA-C 12/27/22 1306

## 2022-12-27 NOTE — ED Triage Notes (Signed)
Pt came in via POV d/t his eye being red around 45 minutes ago. Pt said that he did not feel any pain when someone asked him what was wrong with his eye. A/Ox4, denies pain, Hx of a stroke this past July leading him to be concerned & come in for eval.

## 2022-12-27 NOTE — ED Notes (Signed)
This RN reviewed discharge instructions with patient and wife. Both verbalized understanding and denied any further questions. PT well appearing upon discharge and reports tolerable pain. Pt ambulated with stable gait to exit. Pt endorses ride home.

## 2022-12-27 NOTE — Discharge Instructions (Addendum)
It was a pleasure taking care of you today!  You have a subconjunctival hemorrhage, this should resolve on its own.  You will be sent a prescription for erythromycin ointment, take as directed.  It is important that you call your eye specialist to set up a follow-up appointment during today's ED visit. Maintain your scheduled appointment with your primary care provider as scheduled. You may follow-up with your primary care provider as needed.  Return to the emergency department if you are experiencing increasing/worsening fever, eye drainage, vision changes, worsening symptoms.

## 2022-12-27 NOTE — ED Provider Notes (Signed)
[place] EMERGENCY DEPARTMENT AT [facility] Provider Note   CSN: [id number] Arrival date & time: 12/27/22  1239     History  No chief complaint on file.   Vincent Oconnell is a 67 y.o. male who presents emergency department with concerns for redness to his right eye onset today.  Denies recent sneezing, coughing, straining while having a bowel movement.  Does not wear contacts, wears glasses.  Is followed by his eye specialist with his last visit being in November.  Notes that he feels as if there is an eyelash in his eye however this is chronic and has been ongoing for several months.  Denies blurred vision, double vision.  No recent trauma.  The history is provided by the patient. No language interpreter was used.       Home Medications Prior to Admission medications   Medication Sig Start Date End Date Taking? Authorizing Provider  erythromycin ophthalmic ointment Place a 1/2 inch ribbon of ointment into the right lower eyelid up to four (4) times for the next five (5) days. 12/27/22  Yes ,  A, PA-C  aspirin 81 MG chewable tablet Chew 1 tablet (81 mg total) by mouth daily. 04/05/22   Mathews Argyle, NP  atorvastatin (LIPITOR) 80 MG tablet Take 1 tablet (80 mg total) by mouth daily. 04/05/22   Mathews Argyle, NP  Multiple Vitamins-Minerals (MULTIVITAMIN MEN 50+) TABS Take 1 tablet by mouth daily.    [provider]  ticagrelor (BRILINTA) 90 MG TABS tablet Take 1 tablet (90 mg total) by mouth 2 (two) times daily. 04/04/22   Mathews Argyle, NP      Allergies    Other    Review of Systems   Review of Systems  All other systems reviewed and are negative.   Physical Exam Updated Vital Signs BP (!) 140/78   Pulse 68   Temp 98.2 F (36.8 C) (Oral)   Resp 16   Ht 5\' 8"  (1.727 m)   Wt 81.6 kg   SpO2 97%   BMI 27.37 kg/m  Physical Exam Vitals and nursing note reviewed.  Constitutional:      General: He is not in acute distress.    Appearance:  Normal appearance. He is not ill-appearing.  HENT:     Head: Normocephalic and atraumatic.     Nose: Nose normal. No congestion or rhinorrhea.     Mouth/Throat:     Mouth: Mucous membranes are moist.     Pharynx: Oropharynx is clear. No oropharyngeal exudate or posterior oropharyngeal erythema.  Eyes:     General: Lids are everted, no foreign bodies appreciated. Vision grossly intact. No visual field deficit or scleral icterus.       Right eye: No foreign body or discharge.        Left eye: No foreign body or discharge.     Extraocular Movements: Extraocular movements intact.     Conjunctiva/sclera:     Right eye: Hemorrhage present.     Pupils: Pupils are equal, round, and reactive to light.     Comments: Lids everted, no appreciated foreign bodies. EOMI. PERRL. No visual field deficit. Subconjunctival hemorrhage on right.  Cardiovascular:     Rate and Rhythm: Normal rate.  Pulmonary:     Effort: Pulmonary effort is normal. No respiratory distress.  Abdominal:     Palpations: Abdomen is soft. There is no mass.     Tenderness: There is no abdominal tenderness.  Musculoskeletal:  General: Normal range of motion.     Cervical back: Neck supple.  Skin:    General: Skin is warm and dry.     Findings: No rash.  Neurological:     Mental Status: He is alert.     Sensory: Sensation is intact.     Motor: Motor function is intact.  Psychiatric:        Behavior: Behavior normal.     ED Results / Procedures / Treatments   Labs (all labs ordered are listed, but only abnormal results are displayed) Labs Reviewed - No data to display  EKG None  Radiology No results found.  Procedures Procedures    Medications Ordered in ED Medications  fluorescein ophthalmic strip 1 strip (has no administration in time range)    ED Course/ Medical Decision Making/ A&P Clinical Course as of 12/27/22 1754  Fri Dec 27, 2022  1726 Offered fluorescein exam to the patient today, patient  declines at this time and opts for treatment with antibiotic ointment and follow-up with his from allergist.  Discussed with patient discharge treatment plan.  Answered all verbal questions.  Patient appears safe for discharge at this time. [SB]    Clinical Course User Index [SB] ,  A, PA-C                             Medical Decision Making Risk Prescription drug management.   Patient presents with concerns for right eye redness onset today. Pt wears glasses. Pt denies vision changes, discharge, photophobia. On exam, patient with Lids everted, no appreciated foreign bodies. EOMI. PERRL. No visual field deficit. Subconjunctival hemorrhage on right. Differential diagnosis includes subconjunctival hemorrhage, corneal abrasion, corneal foreign body, traumatic iritis, hyphema.  Disposition: Presentation suspicious for subconjunctival hemorrhage. Offered fluorescein exam, patient declines at this time and opts for treatment with antibiotic ointment. No overt foreign body noted on exam. Less likely hyphema at this time. After consideration of the diagnostic results and the patients response to treatment, I feel that the patient would benefit from Discharge home. Erythromycin ointment prescription sent. Patient acknowledges and verbalizes understanding. Work note provided. Pt instructed to follow up with his eye doctor regarding todays ED visit. Supportive care measures and strict return precautions discussed with patient at bedside. Pt acknowledges and verbalizes understanding. Pt appears safe for discharge. Follow up as indicated in discharge paperwork.    This chart was dictated using voice recognition software, Dragon. Despite the best efforts of this provider to proofread and correct errors, errors may still occur which can change documentation meaning.   Final Clinical Impression(s) / ED Diagnoses Final diagnoses:  Subconjunctival hemorrhage of right eye    Rx / DC Orders ED  Discharge Orders          Ordered    erythromycin ophthalmic ointment        12/27/22 1724              ,  A, PA-C 12/27/22 1754    Virgina Norfolk, DO 12/27/22 1825

## 2023-01-13 ENCOUNTER — Ambulatory Visit (INDEPENDENT_AMBULATORY_CARE_PROVIDER_SITE_OTHER): Payer: Managed Care, Other (non HMO)

## 2023-01-13 DIAGNOSIS — I63532 Cerebral infarction due to unspecified occlusion or stenosis of left posterior cerebral artery: Secondary | ICD-10-CM

## 2023-01-13 LAB — CUP PACEART REMOTE DEVICE CHECK
Date Time Interrogation Session: 20240429020128
Implantable Pulse Generator Implant Date: 20230720
Pulse Gen Serial Number: 511012320

## 2023-01-20 NOTE — Progress Notes (Signed)
[facility] Summary Report / Loop Recorder 

## 2023-02-13 ENCOUNTER — Ambulatory Visit (INDEPENDENT_AMBULATORY_CARE_PROVIDER_SITE_OTHER): Payer: Managed Care, Other (non HMO)

## 2023-02-13 DIAGNOSIS — I63532 Cerebral infarction due to unspecified occlusion or stenosis of left posterior cerebral artery: Secondary | ICD-10-CM

## 2023-02-13 LAB — CUP PACEART REMOTE DEVICE CHECK
Date Time Interrogation Session: 20240530020240
Implantable Pulse Generator Implant Date: 20230720
Pulse Gen Serial Number: 511012320

## 2023-02-14 NOTE — Progress Notes (Signed)
[facility] LOOP RECORDER 

## 2023-03-05 NOTE — Progress Notes (Signed)
[facility] Summary Report / Loop Recorder 

## 2023-03-17 ENCOUNTER — Ambulatory Visit (INDEPENDENT_AMBULATORY_CARE_PROVIDER_SITE_OTHER): Payer: Managed Care, Other (non HMO)

## 2023-03-17 DIAGNOSIS — I63532 Cerebral infarction due to unspecified occlusion or stenosis of left posterior cerebral artery: Secondary | ICD-10-CM

## 2023-03-18 LAB — CUP PACEART REMOTE DEVICE CHECK
Date Time Interrogation Session: 20240630020414
Implantable Pulse Generator Implant Date: 20230720
Pulse Gen Serial Number: 511012320

## 2023-04-08 NOTE — Progress Notes (Signed)
[facility] Loop Recorder  

## 2023-04-10 ENCOUNTER — Other Ambulatory Visit (HOSPITAL_COMMUNITY): Payer: Self-pay | Admitting: Neuroradiology

## 2023-04-10 DIAGNOSIS — I63532 Cerebral infarction due to unspecified occlusion or stenosis of left posterior cerebral artery: Secondary | ICD-10-CM

## 2023-04-15 ENCOUNTER — Ambulatory Visit (INDEPENDENT_AMBULATORY_CARE_PROVIDER_SITE_OTHER): Payer: Managed Care, Other (non HMO)

## 2023-04-15 DIAGNOSIS — I63532 Cerebral infarction due to unspecified occlusion or stenosis of left posterior cerebral artery: Secondary | ICD-10-CM

## 2023-04-16 LAB — CUP PACEART REMOTE DEVICE CHECK
Date Time Interrogation Session: 20240731022251
Implantable Pulse Generator Implant Date: 20230720
Pulse Gen Serial Number: 511012320

## 2023-04-17 ENCOUNTER — Ambulatory Visit: Payer: Managed Care, Other (non HMO)

## 2023-04-19 ENCOUNTER — Ambulatory Visit
Admission: RE | Admit: 2023-04-19 | Discharge: 2023-04-19 | Disposition: A | Discharge status: Home / Self Care | Payer: Managed Care, Other (non HMO) | Source: Ambulatory Visit | Attending: Neuroradiology | Admitting: Neuroradiology

## 2023-04-19 DIAGNOSIS — I63532 Cerebral infarction due to unspecified occlusion or stenosis of left posterior cerebral artery: Secondary | ICD-10-CM

## 2023-04-20 ENCOUNTER — Emergency Department (HOSPITAL_COMMUNITY)
Admission: EM | Admit: 2023-04-20 | Discharge: 2023-04-21 | Disposition: A | Discharge status: Home / Self Care | Payer: Managed Care, Other (non HMO) | Attending: Emergency Medicine | Admitting: Emergency Medicine

## 2023-04-20 ENCOUNTER — Emergency Department (HOSPITAL_COMMUNITY): Payer: Managed Care, Other (non HMO)

## 2023-04-20 ENCOUNTER — Encounter (HOSPITAL_COMMUNITY): Payer: Self-pay

## 2023-04-20 ENCOUNTER — Other Ambulatory Visit: Payer: Self-pay

## 2023-04-20 DIAGNOSIS — R944 Abnormal results of kidney function studies: Secondary | ICD-10-CM | POA: Diagnosis not present

## 2023-04-20 DIAGNOSIS — M542 Cervicalgia: Secondary | ICD-10-CM | POA: Diagnosis not present

## 2023-04-20 DIAGNOSIS — I6522 Occlusion and stenosis of left carotid artery: Secondary | ICD-10-CM

## 2023-04-20 DIAGNOSIS — M25519 Pain in unspecified shoulder: Secondary | ICD-10-CM | POA: Insufficient documentation

## 2023-04-20 DIAGNOSIS — Z7982 Long term (current) use of aspirin: Secondary | ICD-10-CM | POA: Insufficient documentation

## 2023-04-20 LAB — I-STAT CHEM 8, ED
BUN: 15 mg/dL (ref 8–23)
Calcium, Ion: 1.16 mmol/L (ref 1.15–1.40)
Chloride: 102 mmol/L (ref 98–111)
Creatinine, Ser: 1.3 mg/dL — ABNORMAL HIGH (ref 0.61–1.24)
Glucose, Bld: 93 mg/dL (ref 70–99)
HCT: 45 % (ref 39.0–52.0)
Hemoglobin: 15.3 g/dL (ref 13.0–17.0)
Potassium: 3.8 mmol/L (ref 3.5–5.1)
Sodium: 138 mmol/L (ref 135–145)
TCO2: 26 mmol/L (ref 22–32)

## 2023-04-20 MED ORDER — LIDOCAINE 5 % EX PTCH
1.0000 | MEDICATED_PATCH | CUTANEOUS | Status: DC
  Administered 2023-04-20: 1 via TRANSDERMAL
  Filled 2023-04-20: qty 1

## 2023-04-20 MED ORDER — OXYCODONE-ACETAMINOPHEN 5-325 MG PO TABS
1.0000 | ORAL_TABLET | Freq: Once | ORAL | Status: AC
  Administered 2023-04-20: 1 via ORAL
  Filled 2023-04-20: qty 1

## 2023-04-20 MED ORDER — METHOCARBAMOL 500 MG PO TABS
500.0000 mg | ORAL_TABLET | Freq: Once | ORAL | Status: AC
  Administered 2023-04-20: 500 mg via ORAL
  Filled 2023-04-20: qty 1

## 2023-04-20 MED ORDER — ORPHENADRINE CITRATE 30 MG/ML IJ SOLN: 60.0000 mg | Freq: Two times a day (BID) | INTRAMUSCULAR | Status: DC

## 2023-04-20 MED ORDER — IOHEXOL 350 MG/ML SOLN
75.0000 mL | Freq: Once | INTRAVENOUS | Status: AC | PRN
  Administered 2023-04-20: 75 mL via INTRAVENOUS

## 2023-04-20 NOTE — ED Provider Notes (Incomplete)
Patient is a 67 year old male with a known stent in his vertebral artery on the left who is presenting today with worsening left-sided neck pain.  It started yesterday but improved with muscle rub and was a little bit better when he woke up this morning but he did the muscle gun and further muscle rubs and went to work however worsened after getting to work.  Patient has significant point tenderness in the left posterior neck area but denies any arm symptoms.  He denies any infectious symptoms.  No palpable lymph nodes.

## 2023-04-20 NOTE — ED Triage Notes (Signed)
Pt came in via POV d/t stating he has been having 9/10 pain in his upper Lt back area & neck area since yesterday. Denies CP or any recent falls/injuries.

## 2023-04-20 NOTE — ED Provider Notes (Signed)
[place] EMERGENCY DEPARTMENT AT [facility] Provider Note   CSN: [id number] Arrival date & time: 04/20/23  1517     History {Add pertinent medical, surgical, social history, OB history to HPI:1} Chief Complaint  Patient presents with   Shoulder Pain    Jonell Krontz is a 67 y.o. male.   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  He began to feel the pain last night, and took 2 aspirin and applied a muscle rub with slight improvement.  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  He admits to increased pain with any movement of his neck as well as abduction of his shoulder greater than 90 degrees.  He denies any fevers or chills.  He has had no nausea or vomiting.  He has had no changes in mental status, confusion, or disorientation.  He denies any vision changes, dizziness, or gait changes.  He has some residual numbness of his right extremities but denies any increased sensation deficits or focal weakness.  He has been doing yard work, but denies any falls or injuries.    Home Medications Prior to Admission medications   Medication Sig Start Date End Date Taking? Authorizing Provider  aspirin 81 MG chewable tablet Chew 1 tablet (81 mg total) by mouth daily. 04/05/22   Mathews Argyle, NP  atorvastatin (LIPITOR) 80 MG tablet Take 1 tablet (80 mg total) by mouth daily. 04/05/22   Mathews Argyle, NP  erythromycin ophthalmic ointment Place a 1/2 inch ribbon of ointment into the right lower eyelid up to four (4) times for the next five (5) days. 12/27/22   Blue, Soijett A, PA-C  Multiple Vitamins-Minerals (MULTIVITAMIN MEN 50+) TABS Take 1 tablet by mouth daily.    [provider]  ticagrelor (BRILINTA) 90 MG TABS tablet  Take 1 tablet (90 mg total) by mouth 2 (two) times daily. 04/04/22   Mathews Argyle, NP      Allergies    Other    Review of Systems   Review of Systems Negative except for as noted above in HPI  Physical Exam Updated Vital Signs BP (!) 153/91 (BP Location: Right Arm)   Pulse 88   Temp 98.8 F (37.1 C) (Oral)   Resp 18   Ht 5\' 7"  (1.702 m)   Wt 81.6 kg   SpO2 94%   BMI 28.19 kg/m  Physical Exam Vitals and nursing note reviewed.  Constitutional:      General: He is not in acute distress.    Appearance: He is well-developed.  HENT:     Head: Normocephalic and atraumatic.  Eyes:     Extraocular Movements: Extraocular movements intact.     Conjunctiva/sclera: Conjunctivae normal.     Pupils: Pupils are equal, round, and reactive to light.  Neck:     Comments: During his appreciated over the left cervical paraspinal muscles with exquisite tenderness noted to the inferior aspect of the left cervical paraspinal muscles.  Some tenderness noted over the left trapezius as well. Cardiovascular:     Rate and Rhythm: Normal rate and regular rhythm.     Heart sounds: No murmur heard. Pulmonary:     Effort: Pulmonary effort is normal. No respiratory distress.  Breath sounds: Normal breath sounds.     Comments: Saturating well on room air Abdominal:     General: There is no distension.     Palpations: Abdomen is soft.     Tenderness: There is no abdominal tenderness. There is no guarding.  Musculoskeletal:        General: No swelling or tenderness.     Cervical back: Neck supple.     Comments: No thoracic or lumbar spine tenderness to palpation.  Pain noted with most movement of the shoulder, but worsening pain with any flexion or abduction greater than 90 degrees.  Skin:    General: Skin is warm and dry.     Capillary Refill: Capillary refill takes less than 2 seconds.  Neurological:     Mental Status: He is alert.     Comments: Cranial nerves II through XII intact,  sensation chronically decreased on the right side without any acute changes.  Strength 5/5 in all extremities.  Normal finger-to-nose, no pronator drift.  Psychiatric:        Mood and Affect: Mood normal.     ED Results / Procedures / Treatments   Labs (all labs ordered are listed, but only abnormal results are displayed) Labs Reviewed - No data to display  EKG EKG Interpretation Date/Time:  Sunday April 20 2023 15:37:19 EDT Ventricular Rate:  88 PR Interval:  164 QRS Duration:  84 QT Interval:  358 QTC Calculation: 433 R Axis:   -56  Text Interpretation: Normal sinus rhythm Left anterior fascicular block No significant change since last tracing When compared with ECG of 26-Aug-2022 20:47, PREVIOUS ECG IS PRESENT Confirmed by Gwyneth Sprout ([id number]) on 04/20/2023 4:19:27 PM  Radiology DG Shoulder Left  Result Date: 04/20/2023 CLINICAL DATA:  Stiffness.  Tightness. EXAM: LEFT SHOULDER - 2+ VIEW COMPARISON:  None Available. FINDINGS: There is no evidence of fracture or dislocation. There is no evidence of arthropathy or other focal bone abnormality. Soft tissues are unremarkable. Loop recorder visible in the soft tissues of the left chest. IMPRESSION: Negative. Electronically Signed   By: Paulina Fusi M.D.   On: 04/20/2023 16:25    Procedures Procedures  {Document cardiac monitor, telemetry assessment procedure when appropriate:1}  Medications Ordered in ED Medications - No data to display  ED Course/ Medical Decision Making/ A&P   {   Click here for ABCD2, HEART and other calculatorsREFRESH Note before signing :1}                              Medical Decision Making  ***  {Document critical care time when appropriate:1} {Document review of labs and clinical decision tools ie heart score, Chads2Vasc2 etc:1}  {Document your independent review of radiology images, and any outside records:1} {Document your discussion with family members, caretakers, and with  consultants:1} {Document social determinants of health affecting pt's care:1} {Document your decision making why or why not admission, treatments were needed:1} Final Clinical Impression(s) / ED Diagnoses Final diagnoses:  None    Rx / DC Orders ED Discharge Orders     None

## 2023-04-20 NOTE — ED Notes (Signed)
Pt transported to xray 

## 2023-04-21 ENCOUNTER — Emergency Department (HOSPITAL_COMMUNITY): Payer: Managed Care, Other (non HMO)

## 2023-04-21 ENCOUNTER — Telehealth: Payer: Self-pay

## 2023-04-21 MED ORDER — DIAZEPAM 5 MG/ML IJ SOLN
2.5000 mg | Freq: Once | INTRAMUSCULAR | Status: AC
  Administered 2023-04-21: 2.5 mg via INTRAVENOUS
  Filled 2023-04-21: qty 2

## 2023-04-21 MED ORDER — METHOCARBAMOL 500 MG PO TABS: 500.0000 mg | ORAL_TABLET | Freq: Two times a day (BID) | ORAL | 0 refills | Status: DC

## 2023-04-21 MED ORDER — HYDROMORPHONE HCL 1 MG/ML IJ SOLN
0.5000 mg | Freq: Once | INTRAMUSCULAR | Status: AC
  Administered 2023-04-21: 0.5 mg via INTRAVENOUS
  Filled 2023-04-21: qty 1

## 2023-04-21 NOTE — Telephone Encounter (Signed)
Following alert received from [employer] received for 1 Tachy event on 04/21/23 at 01:41, 3 sec. Artifact oversensing noted. Called and spoke with [employer] who felt it is c/w artifact. Routed to clinic for review of rhythm.   Patient reports he was awake during this time fixing food. Patient denies any symptoms and doing well. Patient does report device has moved since implant. Routing to Dr. Lalla Brothers to advise.

## 2023-04-21 NOTE — ED Provider Notes (Cosign Needed Addendum)
Patient was received at shift change from [facility] DO please see  note for full detail  Patient with medical history including ischemic CVA, 2023 status post thrombectomy, left PCA stent placement, currently on aspirin and Brilinta, coming in with left-sided neck pain, started yesterday, pain is worse with movement improved with rest, denies any worsening paresthesia or weakness in his arms.  He denies any headaches, change in vision, paresthesia or weakness of the lower extremities, nausea and head trauma, says been compliant with his medications.  States he tried to work out the knot but has been unsuccessful.  Previous provider obtain CTA of head and neck shows possible thrombosis, further spoke with neurology who recommends MRI for further assessment.   Physical Exam  BP 128/77   Pulse 83   Temp 99.3 F (37.4 C) (Oral)   Resp 18   Ht 5\' 7"  (1.702 m)   Wt 81.6 kg   SpO2 96%   BMI 28.19 kg/m   Physical Exam Vitals and nursing note reviewed.  Constitutional:      General: He is not in acute distress.    Appearance: He is not ill-appearing.  HENT:     Head: Normocephalic and atraumatic.     Nose: No congestion.  Eyes:     Conjunctiva/sclera: Conjunctivae normal.  Cardiovascular:     Rate and Rhythm: Normal rate and regular rhythm.     Pulses: Normal pulses.  Pulmonary:     Effort: Pulmonary effort is normal.  Musculoskeletal:     Comments: Cervical spine palpated was nontender to palpation no step-off deformities noted, patient is moving his upper extremities without difficulty, 5-5 strength, neurovascularly intact.  Skin:    General: Skin is warm and dry.  Neurological:     Mental Status: He is alert.     GCS: GCS eye subscore is 4. GCS verbal subscore is 5. GCS motor subscore is 6.     Cranial Nerves: Cranial nerves 2-12 are intact. No cranial nerve deficit.     Motor: No weakness.     Coordination: Romberg sign negative. Finger-Nose-Finger Test normal.      Comments: Cranial nerves II through XII grossly intact no difficulty with word finding,  following two step commands there is no unilateral weakness present on my exam.  Psychiatric:        Mood and Affect: Mood normal.     Procedures  Procedures  ED Course / MDM    Medical Decision Making Amount and/or Complexity of Data Reviewed Radiology: ordered.  Risk Prescription drug management.   Lab Tests:  I Ordered, and personally interpreted labs.  The pertinent results include: I-STAT shows a creatinine of 1.3   Imaging Studies ordered:  I ordered imaging studies including plain film shoulder, CTA head and neck, MRI brain without I independently visualized and interpreted imaging which showed plain film of shoulder is unremarkable, CT head neck reveals filling defect in the mid cervical left ICA consistent with thrombosis, MRI is negative for acute findings I agree with the radiologist interpretation   Cardiac Monitoring:  The patient was maintained on a cardiac monitor.  I personally viewed and interpreted the cardiac monitored which showed an underlying rhythm of: EKG without signs of ischemia   Medicines ordered and prescription drug management:  I ordered medication including Dilaudid, Valium I have reviewed the patients home medicines and have made adjustments as needed  Critical Interventions:  N/A   Reevaluation:  Presents with neck pain, reassessed patient, states she  is having continued pain will provide with additional pain medication.  MRI is unremarkable, will consult with neurology for further recommendations  Patient is updated on these recommendations, he is agreement discharge at this time.  Consultations Obtained:  I requested consultation with Dr. Derry Lory and discussed lab and imaging findings as well as pertinent plan - they recommend: Likely patient be discharged but recommends consulting with neurointerventional radiology for further  recommendations. Neurointerventional radiology Dr. Joana Reamer rodrigues, if patient having no deficits at this time no immediate intervention is necessary, continue to manage medically, and will see as an outpatient.    Test Considered:  N/A    Rule out low suspicion for internal head bleed and or mass as CT imaging is negative for acute findings.  Low suspicion for CVA she has no focal deficit present my exam. Low suspicion for meningitis as she has no meningeal sign present.    Dispostion and problem list  After consideration of the diagnostic results and the patients response to treatment, I feel that the patent would benefit from discharge.  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 Neck pain-suspect this is more muscular in nature, will discharge home on a muscle relaxer, follow-up with PCP for further assessment strict return precautions.           Raymond, Kallay, PA-C 04/21/23 1610    Carroll Sage, PA-C 04/21/23 9604    Maia Plan, MD 04/24/23 [id number]

## 2023-04-21 NOTE — ED Notes (Signed)
Patient transported to MRI 

## 2023-04-21 NOTE — Discharge Instructions (Addendum)
You have a small clot in one of your blood vessels, please continue with your home medications aspirin and Brilinta, I need to follow-up with interventional radiology for further assessment and given contact information please call for follow-up Neck pain-likely this is muscular, would like you to follow-up with neurosurgery for further evaluation, I have given you muscle relaxer please take as prescribed and use over-the-counter pain medication as needed  I have also given you a prescription for a muscle relaxer this can make you drowsy do not consume alcohol or operate heavy machinery when taking this medication.   Come back to the emergency department if you develop chest pain, shortness of breath, severe abdominal pain, uncontrolled nausea, vomiting, diarrhea.

## 2023-04-24 ENCOUNTER — Telehealth (HOSPITAL_COMMUNITY): Payer: Self-pay | Admitting: Radiology

## 2023-04-24 NOTE — Telephone Encounter (Signed)
Pt called and stated he was in the ED over the weekend and had a MR scan done due to onset of stroke like sx. He is scheduled for a routine f/u MR per Dr. Quay Burow on 8/11. He wants to know if he still needs this second scan or if the one done on 8/5 will suffice. Message sent to Dr. Rondall Allegra to advise on this. JM

## 2023-04-25 ENCOUNTER — Other Ambulatory Visit (HOSPITAL_COMMUNITY): Payer: Self-pay | Admitting: Neuroradiology

## 2023-04-25 ENCOUNTER — Telehealth (HOSPITAL_COMMUNITY): Payer: Self-pay | Admitting: Radiology

## 2023-04-25 DIAGNOSIS — I63532 Cerebral infarction due to unspecified occlusion or stenosis of left posterior cerebral artery: Secondary | ICD-10-CM

## 2023-04-25 NOTE — Telephone Encounter (Signed)
Called pt to schedule f/u office visit with Dr. Quay Burow. Left VM for him to call back to schedule. JM

## 2023-04-27 ENCOUNTER — Other Ambulatory Visit: Payer: Managed Care, Other (non HMO)

## 2023-04-29 ENCOUNTER — Telehealth (HOSPITAL_COMMUNITY): Payer: Self-pay

## 2023-04-29 NOTE — Telephone Encounter (Signed)
2nd message left to let pt know that his appt for 8/16 had to be moved to 8/19. Dr. Quay Burow will be out that day. AB

## 2023-05-02 ENCOUNTER — Other Ambulatory Visit (HOSPITAL_COMMUNITY): Payer: Managed Care, Other (non HMO)

## 2023-05-02 ENCOUNTER — Emergency Department (HOSPITAL_COMMUNITY)
Admission: EM | Admit: 2023-05-02 | Discharge: 2023-05-02 | Discharge status: Against Medical Advice | Payer: Managed Care, Other (non HMO) | Attending: Emergency Medicine | Admitting: Emergency Medicine

## 2023-05-02 ENCOUNTER — Other Ambulatory Visit: Payer: Self-pay

## 2023-05-02 ENCOUNTER — Encounter (HOSPITAL_COMMUNITY): Payer: Self-pay | Admitting: *Deleted

## 2023-05-02 DIAGNOSIS — Z5321 Procedure and treatment not carried out due to patient leaving prior to being seen by health care provider: Secondary | ICD-10-CM | POA: Insufficient documentation

## 2023-05-02 DIAGNOSIS — R509 Fever, unspecified: Secondary | ICD-10-CM | POA: Diagnosis present

## 2023-05-02 DIAGNOSIS — U071 COVID-19: Secondary | ICD-10-CM | POA: Insufficient documentation

## 2023-05-02 HISTORY — DX: Cerebral infarction, unspecified: I63.9

## 2023-05-02 LAB — URINALYSIS, ROUTINE W REFLEX MICROSCOPIC
Bilirubin Urine: NEGATIVE
Glucose, UA: NEGATIVE mg/dL
Hgb urine dipstick: NEGATIVE
Ketones, ur: NEGATIVE mg/dL
Leukocytes,Ua: NEGATIVE
Nitrite: NEGATIVE
Protein, ur: NEGATIVE mg/dL
Specific Gravity, Urine: 1.026 (ref 1.005–1.030)
pH: 6 (ref 5.0–8.0)

## 2023-05-02 LAB — COMPREHENSIVE METABOLIC PANEL
ALT: 25 U/L (ref 0–44)
AST: 25 U/L (ref 15–41)
Albumin: 3.9 g/dL (ref 3.5–5.0)
Alkaline Phosphatase: 60 U/L (ref 38–126)
Anion gap: 10 (ref 5–15)
BUN: 12 mg/dL (ref 8–23)
CO2: 24 mmol/L (ref 22–32)
Calcium: 9 mg/dL (ref 8.9–10.3)
Chloride: 102 mmol/L (ref 98–111)
Creatinine, Ser: 1.44 mg/dL — ABNORMAL HIGH (ref 0.61–1.24)
GFR, Estimated: 53 mL/min — ABNORMAL LOW (ref >60)
Glucose, Bld: 125 mg/dL — ABNORMAL HIGH (ref 70–99)
Potassium: 3.6 mmol/L (ref 3.5–5.1)
Sodium: 136 mmol/L (ref 135–145)
Total Bilirubin: 1.2 mg/dL (ref 0.3–1.2)
Total Protein: 7.5 g/dL (ref 6.5–8.1)

## 2023-05-02 LAB — CBC
HCT: 46.9 % (ref 39.0–52.0)
Hemoglobin: 15.4 g/dL (ref 13.0–17.0)
MCH: 29.6 pg (ref 26.0–34.0)
MCHC: 32.8 g/dL (ref 30.0–36.0)
MCV: 90.2 fL (ref 80.0–100.0)
Platelets: 212 10*3/uL (ref 150–400)
RBC: 5.2 MIL/uL (ref 4.22–5.81)
RDW: 11.9 % (ref 11.5–15.5)
WBC: 6.6 10*3/uL (ref 4.0–10.5)
nRBC: 0 % (ref 0.0–0.2)

## 2023-05-02 LAB — LIPASE, BLOOD: Lipase: 23 U/L (ref 11–51)

## 2023-05-02 LAB — SARS CORONAVIRUS 2 BY RT PCR: SARS Coronavirus 2 by RT PCR: POSITIVE — AB

## 2023-05-02 NOTE — ED Notes (Signed)
Pt stated he was going to [facility] right after triage and did not come back. We called pt multiple times for vital check and registration. No response.

## 2023-05-02 NOTE — ED Triage Notes (Signed)
Fever with a headache since yesterday his temp has been very high and he has had  tylenol    at 0900am  no other symptoms

## 2023-05-04 ENCOUNTER — Ambulatory Visit
Admission: EM | Admit: 2023-05-04 | Discharge: 2023-05-04 | Disposition: A | Discharge status: Home / Self Care | Payer: Managed Care, Other (non HMO) | Attending: Physician Assistant | Admitting: Physician Assistant

## 2023-05-04 DIAGNOSIS — R051 Acute cough: Secondary | ICD-10-CM | POA: Diagnosis not present

## 2023-05-04 DIAGNOSIS — U071 COVID-19: Secondary | ICD-10-CM | POA: Diagnosis not present

## 2023-05-04 DIAGNOSIS — R509 Fever, unspecified: Secondary | ICD-10-CM | POA: Diagnosis not present

## 2023-05-04 MED ORDER — FLUTICASONE PROPIONATE 50 MCG/ACT NA SUSP: 1.0000 | Freq: Every day | NASAL | 0 refills | Status: DC

## 2023-05-04 MED ORDER — PROMETHAZINE-DM 6.25-15 MG/5ML PO SYRP: 5.0000 mL | ORAL_SOLUTION | Freq: Four times a day (QID) | ORAL | 0 refills | Status: DC | PRN

## 2023-05-04 NOTE — Discharge Instructions (Addendum)
Can use cough syrup as needed for cough Recommend Mucinex and Flonase Continue with Tylenol as needed for fever Drink plenty of fluids, rest Can return if you develop new or worsening symptoms

## 2023-05-04 NOTE — ED Provider Notes (Signed)
[facility]    CSN: [id number] Arrival date & time: 05/04/23  1122      History   Chief Complaint Chief Complaint  Patient presents with   Fever   COVID19 Testing    Per request    HPI Vincent Oconnell is a 67 y.o. male.   Pt complains of fever, congestion, cough that started three days ago.  He has been taking Tylenol with reduction of fever. He reports runny nose, congestion, mild headaches.  He denies nausea, vomiting.  Reports slight diarrhea that started today.  Pt went to the ED 05/02/2023 but left given wait time.  COVID test at this visit positive.  Patient denies history of asthma or lung disease.  He went to work today but left due to symptoms.  Reports he was wearing a mask at work.  Denies sick contacts.  Denies wheezing or shortness of breath    Past Medical History:  Diagnosis Date   Stroke [facility])     Patient Active Problem List   Diagnosis Date Noted   Essential hypertension 04/04/2022   Hyperlipidemia 04/04/2022   Acute ischemic left PCA stroke (HCC) 04/01/2022    Past Surgical History:  Procedure Laterality Date   IR ANGIO INTRA EXTRACRAN SEL INTERNAL CAROTID BILAT MOD SED  10/11/2022   IR ANGIO VERTEBRAL SEL VERTEBRAL BILAT MOD SED  10/11/2022   IR ANGIO VERTEBRAL SEL VERTEBRAL UNI L MOD SED  04/03/2022   IR CT HEAD LTD  04/02/2022   IR CT HEAD LTD  04/02/2022   IR PERCUTANEOUS ART THROMBECTOMY/INFUSION INTRACRANIAL INC DIAG ANGIO  04/01/2022   IR PERCUTANEOUS ART THROMBECTOMY/INFUSION INTRACRANIAL INC DIAG ANGIO  04/02/2022   IR US GUIDE VASC ACCESS RIGHT  04/01/2022   IR US GUIDE VASC ACCESS RIGHT  04/02/2022   IR US GUIDE VASC ACCESS RIGHT  10/11/2022   LOOP RECORDER INSERTION N/A 04/04/2022   Procedure: LOOP RECORDER INSERTION;  Surgeon: Lanier Prude, MD;  Location: [facility] IN[facility];  Service: Cardiovascular;  Laterality: N/A;   RADIOLOGY WITH ANESTHESIA N/A 04/01/2022   Procedure: RADIOLOGY WITH ANESTHESIA;  Surgeon: Radiologist,  Medication, MD;  Location: [facility] OR;  Service: Radiology;  Laterality: N/A;       Home Medications    Prior to Admission medications   Medication Sig Start Date End Date Taking? Authorizing Provider  aspirin 81 MG chewable tablet Chew 1 tablet (81 mg total) by mouth daily. 04/05/22  Yes Mathews Argyle, NP  atorvastatin (LIPITOR) 80 MG tablet Take 1 tablet (80 mg total) by mouth daily. 04/05/22  Yes Mathews Argyle, NP  fluticasone (FLONASE) 50 MCG/ACT nasal spray Place 1 spray into both nostrils daily. 05/04/23  Yes Ward, Tylene Fantasia, PA-C  Multiple Vitamins-Minerals (MULTIVITAMIN MEN 50+) TABS Take 1 tablet by mouth daily.   Yes [provider]  promethazine-dextromethorphan (PROMETHAZINE-DM) 6.25-15 MG/5ML syrup Take 5 mLs by mouth 4 (four) times daily as needed for cough. 05/04/23  Yes Ward, Tylene Fantasia, PA-C  ticagrelor (BRILINTA) 90 MG TABS tablet Take 1 tablet (90 mg total) by mouth 2 (two) times daily. 04/04/22  Yes Mathews Argyle, NP  erythromycin ophthalmic ointment Place a 1/2 inch ribbon of ointment into the right lower eyelid up to four (4) times for the next five (5) days. 12/27/22   Blue, Soijett A, PA-C  methocarbamol (ROBAXIN) 500 MG tablet Take 1 tablet (500 mg total) by mouth 2 (two) times daily. 04/21/23   Carroll Sage, PA-C  Family History History reviewed. No pertinent family history.  Social History Social History   Tobacco Use   Smoking status: Never   Smokeless tobacco: Never  Vaping Use   Vaping status: Never Used  Substance Use Topics   Alcohol use: No   Drug use: Never     Allergies   Other   Review of Systems Review of Systems  Constitutional:  Positive for fever. Negative for chills.  HENT:  Positive for congestion. Negative for ear pain and sore throat.   Eyes:  Negative for pain and visual disturbance.  Respiratory:  Positive for cough. Negative for shortness of breath.   Cardiovascular:  Negative for chest pain and palpitations.   Gastrointestinal:  Negative for abdominal pain and vomiting.  Genitourinary:  Negative for dysuria and hematuria.  Musculoskeletal:  Negative for arthralgias and back pain.  Skin:  Negative for color change and rash.  Neurological:  Negative for seizures and syncope.  All other systems reviewed and are negative.    Physical Exam Triage Vital Signs ED Triage Vitals  Encounter Vitals Group     BP 05/04/23 1136 129/74     Systolic BP Percentile --      Diastolic BP Percentile --      Pulse Rate 05/04/23 1136 (!) 103     Resp 05/04/23 1136 18     Temp 05/04/23 1136 99.7 F (37.6 C)     Temp Source 05/04/23 1136 Oral     SpO2 05/04/23 1136 93 %     Weight 05/04/23 1134 175 lb (79.4 kg)     Height 05/04/23 1134 5\' 7"  (1.702 m)     Head Circumference --      Peak Flow --      Pain Score 05/04/23 1129 0     Pain Loc --      Pain Education --      Exclude from Growth Chart --    No data found.  Updated Vital Signs BP 129/74 (BP Location: Left Arm)   Pulse (!) 103   Temp 99.7 F (37.6 C) (Oral)   Resp 18   Ht 5\' 7"  (1.702 m)   Wt 175 lb (79.4 kg)   SpO2 93%   BMI 27.41 kg/m   Visual Acuity Right Eye Distance:   Left Eye Distance:   Bilateral Distance:    Right Eye Near:   Left Eye Near:    Bilateral Near:     Physical Exam Vitals and nursing note reviewed.  Constitutional:      General: He is not in acute distress.    Appearance: He is well-developed.  HENT:     Head: Normocephalic and atraumatic.  Eyes:     Conjunctiva/sclera: Conjunctivae normal.  Cardiovascular:     Rate and Rhythm: Normal rate and regular rhythm.     Heart sounds: No murmur heard. Pulmonary:     Effort: Pulmonary effort is normal. No respiratory distress.     Breath sounds: Normal breath sounds.  Abdominal:     Palpations: Abdomen is soft.     Tenderness: There is no abdominal tenderness.  Musculoskeletal:        General: No swelling.     Cervical back: Neck supple.  Skin:     General: Skin is warm and dry.     Capillary Refill: Capillary refill takes less than 2 seconds.  Neurological:     Mental Status: He is alert.  Psychiatric:  Mood and Affect: Mood normal.      UC Treatments / Results  Labs (all labs ordered are listed, but only abnormal results are displayed) Labs Reviewed  SARS CORONAVIRUS 2 (TAT 6-24 HRS)    EKG   Radiology No results found.  Procedures Procedures (including critical care time)  Medications Ordered in UC Medications - No data to display  Initial Impression / Assessment and Plan / UC Course  I have reviewed the triage vital signs and the nursing notes.  Pertinent labs & imaging results that were available during my care of the patient were reviewed by me and considered in my medical decision making (see chart for details).     COVID.  At this time given mild symptoms do not recommend Paxlovid.  Patient is also on Brilinta.  Supportive care discussed.  Cough syrup and Flonase prescribed.  Work note given.  Return precautions given. patient overall well-appearing in no acute distress lungs clear to auscultation. Final Clinical Impressions(s) / UC Diagnoses   Final diagnoses:  Fever, unspecified  COVID  Acute cough     Discharge Instructions      Can use cough syrup as needed for cough Recommend Mucinex and Flonase Continue with Tylenol as needed for fever Drink plenty of fluids, rest Can return if you develop new or worsening symptoms      ED Prescriptions     Medication Sig Dispense Auth. Provider   promethazine-dextromethorphan (PROMETHAZINE-DM) 6.25-15 MG/5ML syrup Take 5 mLs by mouth 4 (four) times daily as needed for cough. 118 mL Ward, Shanda Bumps Z, PA-C   fluticasone (FLONASE) 50 MCG/ACT nasal spray Place 1 spray into both nostrils daily. 9.9 mL Ward, Tylene Fantasia, PA-C      PDMP not reviewed this encounter.   Ward, Tylene Fantasia, PA-C 05/04/23 1209

## 2023-05-04 NOTE — ED Triage Notes (Signed)
"  I have been having a bad cold the last 3 days and I need a COVID19 test". Taken to [facility] "who tested him" Friday night. Left before received results or care Friday night. "I need a work note as well".

## 2023-05-05 ENCOUNTER — Ambulatory Visit (HOSPITAL_COMMUNITY): Payer: Managed Care, Other (non HMO)

## 2023-05-05 ENCOUNTER — Ambulatory Visit (HOSPITAL_COMMUNITY)
Admission: RE | Admit: 2023-05-05 | Discharge: 2023-05-05 | Disposition: A | Discharge status: Home / Self Care | Payer: Managed Care, Other (non HMO) | Source: Ambulatory Visit | Attending: Neuroradiology | Admitting: Neuroradiology

## 2023-05-05 DIAGNOSIS — I63532 Cerebral infarction due to unspecified occlusion or stenosis of left posterior cerebral artery: Secondary | ICD-10-CM

## 2023-05-07 NOTE — Progress Notes (Signed)
[facility] Loop Recorder  

## 2023-05-12 ENCOUNTER — Ambulatory Visit (HOSPITAL_COMMUNITY)
Admission: RE | Admit: 2023-05-12 | Discharge: 2023-05-12 | Disposition: A | Discharge status: Home / Self Care | Payer: Managed Care, Other (non HMO) | Source: Ambulatory Visit | Attending: Neuroradiology | Admitting: Neuroradiology

## 2023-05-14 NOTE — Progress Notes (Signed)
Referring Physician(s): de Alveda Reasons, [place]  History of present illness:  Vincent Oconnell is a 67 year old male who presented to the emergency room on 04/01/2022 with dizziness, right hemianopia, dysarthria and mild right-sided weakness. His NIHSS was fluctuating between 3 and 12. Vincent Oconnell was taken to mechanical thrombectomy with recanalization of a fetal left P2/PCA. Vincent Oconnell responded well to treatment with significant improvement of symptoms. However, around 9 p.m. on the same day, patient head a sudden neurological decline with right upper extremity weakness and vision loss. Repeat CT angiogram of the head and neck showed reocclusion of the left P2/PCA segment.  Vincent Oconnell again underwent mechanical thrombectomy with  rescue left PCA stent placement.    On October 11, 2022, Vincent Oconnell underwent a cerebral angiogram to evaluate stent patency.  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  Vincent Oconnell was due for follow-up imaging to decide on need for continued dual anti-platelet therapy.  However, on 04/21/2023, Vincent Oconnell presented to the emergency room for severe left-sided neck pain.  Vincent Oconnell tried massage therapy without success prior to presenting to the hospital.  Due to his prior history of stroke, a CT angiogram of the head and neck was obtained.  It showed a small filling defect within the mid cervical left ICA concerning for nonocclusive intraluminal thrombus without significant stenosis.  On my independent review, a subtle luminal irregularity of the left ICA is also seen at this level, in addition to increased tortuosity, which could indicate fibromuscular dysplasia.  Review of prior angiograms showed that this irregularity was present before.     Vincent Oconnell has made a remarkable recovery since his stroke despite left PCA territory infarct. Vincent Oconnell states that his vision  has returned to baseline and that Vincent Oconnell has been doing well with his only limitation being short-term memory loss. Vincent Oconnell has no new neurological complaint.  His neck pain has improved.   Past Medical History:  Diagnosis Date   Stroke [facility])     Past Surgical History:  Procedure Laterality Date   IR ANGIO INTRA EXTRACRAN SEL INTERNAL CAROTID BILAT MOD SED  10/11/2022   IR ANGIO VERTEBRAL SEL VERTEBRAL BILAT MOD SED  10/11/2022   IR ANGIO VERTEBRAL SEL VERTEBRAL UNI L MOD SED  04/03/2022   IR CT HEAD LTD  04/02/2022   IR CT HEAD LTD  04/02/2022   IR PERCUTANEOUS ART THROMBECTOMY/INFUSION INTRACRANIAL INC DIAG ANGIO  04/01/2022   IR PERCUTANEOUS ART THROMBECTOMY/INFUSION INTRACRANIAL INC DIAG ANGIO  04/02/2022   IR US GUIDE VASC ACCESS RIGHT  04/01/2022   IR US GUIDE VASC ACCESS RIGHT  04/02/2022   IR US GUIDE VASC ACCESS RIGHT  10/11/2022   LOOP RECORDER INSERTION N/A 04/04/2022   Procedure: LOOP RECORDER INSERTION;  Surgeon: Lanier Prude, MD;  Location: [facility];  Service: Cardiovascular;  Laterality: N/A;   RADIOLOGY WITH ANESTHESIA N/A 04/01/2022   Procedure: RADIOLOGY WITH ANESTHESIA;  Surgeon: Radiologist, Medication, MD;  Location: [facility] OR;  Service: Radiology;  Laterality: N/A;    Allergies: Other  Medications: Prior to Admission medications   Medication Sig Start Date End Date Taking? Authorizing Provider  aspirin 81 MG chewable tablet Chew 1 tablet (81 mg total) by mouth daily. 04/05/22   Mathews Argyle, NP  atorvastatin (LIPITOR) 80 MG tablet Take 1 tablet (80 mg total) by mouth daily. 04/05/22   Gevena Mart  A, NP  erythromycin ophthalmic ointment Place a 1/2 inch ribbon of ointment into the right lower eyelid up to four (4) times for the next five (5) days. 12/27/22   Blue, Soijett A, PA-C  fluticasone (FLONASE) 50 MCG/ACT nasal spray Place 1 spray into both nostrils daily. 05/04/23   Ward, Tylene Fantasia, PA-C  methocarbamol (ROBAXIN) 500 MG tablet Take 1 tablet (500 mg total) by  mouth 2 (two) times daily. 04/21/23   Carroll Sage, PA-C  Multiple Vitamins-Minerals (MULTIVITAMIN MEN 50+) TABS Take 1 tablet by mouth daily.    [provider]  promethazine-dextromethorphan (PROMETHAZINE-DM) 6.25-15 MG/5ML syrup Take 5 mLs by mouth 4 (four) times daily as needed for cough. 05/04/23   Ward, Tylene Fantasia, PA-C  ticagrelor (BRILINTA) 90 MG TABS tablet Take 1 tablet (90 mg total) by mouth 2 (two) times daily. 04/04/22   Mathews Argyle, NP     No family history on file.  Social History   Socioeconomic History   Marital status: Married    Spouse name: Not on file   Number of children: Not on file   Years of education: Not on file   Highest education level: Not on file  Occupational History   Not on file  Tobacco Use   Smoking status: Never   Smokeless tobacco: Never  Vaping Use   Vaping status: Never Used  Substance and Sexual Activity   Alcohol use: No   Drug use: Never   Sexual activity: Not Currently  Other Topics Concern   Not on file  Social History Narrative   Not on file   Social Determinants of Health   Financial Resource Strain: Not on file  Food Insecurity: Not on file  Transportation Needs: Not on file  Physical Activity: Not on file  Stress: Not on file  Social Connections: Not on file     Vital Signs: There were no vitals taken for this visit.  Physical Exam Constitutional:      Appearance: Normal appearance.  HENT:     Head: Normocephalic and atraumatic.     Mouth/Throat:     Mouth: Mucous membranes are moist.     Pharynx: Oropharynx is clear.  Eyes:     Extraocular Movements: Extraocular movements intact.     Conjunctiva/sclera: Conjunctivae normal.     Pupils: Pupils are equal, round, and reactive to light.  Pulmonary:     Effort: Pulmonary effort is normal.  Neurological:     Mental Status: Vincent Oconnell is alert and oriented to person, place, and time.     Cranial Nerves: Cranial nerves 2-12 are intact.     Sensory:  Sensation is intact.     Motor: Motor function is intact.     Coordination: Coordination is intact.     Gait: Gait is intact.     Imaging: No results found.  Labs:  CBC: Recent Labs    08/26/22 2059 10/11/22 0712 04/20/23 1755 05/02/23 1945  WBC 5.1 7.4  --  6.6  HGB 14.8 14.8 15.3 15.4  HCT 44.3 44.4 45.0 46.9  PLT 206 185  --  212    COAGS: Recent Labs    10/11/22 0712  INR 1.0    BMP: Recent Labs    08/26/22 2059 10/11/22 0712 04/20/23 1755 05/02/23 1945  NA 137 138 138 136  K 3.9 4.4 3.8 3.6  CL 103 102 102 102  CO2 24 30  --  24  GLUCOSE 103* 107* 93 125*  BUN  12 13 15 12   CALCIUM 9.2 9.0  --  9.0  CREATININE 1.23 1.33* 1.30* 1.44*  GFRNONAA >60 59*  --  53*    LIVER FUNCTION TESTS: Recent Labs    08/26/22 2059 05/02/23 1945  BILITOT 0.4 1.2  AST 92* 25  ALT 112* 25  ALKPHOS 121 60  PROT 7.4 7.5  ALBUMIN 3.9 3.9    Assessment:  Vincent Oconnell is a 67 year old male with prior history of stroke with mechanical thrombectomy with left PCA rescue stenting.  Vincent Oconnell has been on dual anti-platelet therapy with aspirin and Brilinta since stent placement.  Recently, Vincent Oconnell presented to the ER for neck pain and was found to have a small filling defect in the cervical left ICA.  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  I advised him to continue on dual anti-platelet therapy for 6 months.  I also advised him to avoid neck manipulation.  We will obtain a CT angiogram of the head and neck after 6 months to evaluate luminal patency and decide on further need of dual anti platelet therapy.  Vincent Oconnell understood and agreed with the plan.  Signed: Baldemar Lenis, MD 05/14/2023, 4:45 PM    I spent a total of    40 Minutes in face to face in clinical  consultation, greater than 50% of which was counseling/coordinating care for fibromuscular dysplasia and intravascular thrombus.

## 2023-05-16 ENCOUNTER — Ambulatory Visit (INDEPENDENT_AMBULATORY_CARE_PROVIDER_SITE_OTHER): Payer: Managed Care, Other (non HMO)

## 2023-05-16 DIAGNOSIS — I63532 Cerebral infarction due to unspecified occlusion or stenosis of left posterior cerebral artery: Secondary | ICD-10-CM | POA: Diagnosis not present

## 2023-05-20 NOTE — Progress Notes (Signed)
[facility] Loop Recorder  

## 2023-05-21 ENCOUNTER — Telehealth (HOSPITAL_COMMUNITY): Payer: Self-pay

## 2023-05-21 LAB — CUP PACEART REMOTE DEVICE CHECK
Date Time Interrogation Session: 20240831020106
Implantable Pulse Generator Implant Date: 20230720
Pulse Gen Serial Number: 511012320

## 2023-05-21 NOTE — Telephone Encounter (Signed)
-----   Message from Baldemar Lenis sent at 05/14/2023  4:54 PM EDT ----- Hello girls,  Vincent Oconnell was supposed to stop his Brilinta by now. However, he came to ED at the beginning of the month and a CTA showed a small clot in his ICA. We discussed the finsings and decided to prolong the brilinta until the rest of the year. By end of December, he should have a CTA head and neck. If it turns out clear, we will stop brilinta at that time.  Thanks, [employer]

## 2023-05-26 NOTE — Progress Notes (Unsigned)
PATIENT: Vincent Oconnell DOB: 1956-01-19  REASON FOR VISIT: follow up HISTORY FROM: patient PRIMARY NEUROLOGIST: Dr. Pearlean Brownie  No chief complaint on file.    HISTORY OF PRESENT ILLNESS: Today 05/26/23: Vincent Oconnell is a 67 year old male with a history of left PCA infarct.  He returns today for follow-up.  Overall he feels that he is doing well.  He states that his vision has returned to his baseline.  Reports that in the right upper extremity he still feels some numbness but denies any significant weakness.  He is working with occupational therapy.  No longer in physical therapy.  He is currently taking aspirin and Brilinta.  Remains on Lipitor for his cholesterol.  Has an appointment in September with his PCP.  He did get the loop recorder implanted.  He returns today for an evaluation.   HISTORY ( copied from Dr. Pearlean Brownie note): Vincent Oconnell is a 67 y.o. male with a past medical history significant for arthritis, no other known medical problems and not on any medications.     He has been in his usual state of health and had been feeling well including mowing the lawn this morning.  He had gone to the gym and was working out his leg muscles.  He got up and adjusted the weights (was not in the middle of straining), when he suddenly felt dizzy and had to sit down on the weight bench.  He noted he could not see on the right side.  On EMS arrival they noted that he had significant right arm and leg weakness as well.    He initially had an NIH stroke scale of 3 (partial hemianopia on the right side, right leg drift, mild dysarthria, one-point each).  TNK checklist was reviewed and patient was consented with appropriate delivery of TNK after negative head CT.  Subsequently he developed worsening symptoms with an NIH as high as 12 (2 points for drowsiness, 2 points for full right hemianopia, 3 points for right arm weakness, 3 points for right leg weakness, one-point for sensory loss on the right side,  one-point for dysarthria).  Head CT was repeated and did not show any hemorrhage. Initial CTA was poor diagnostic quality and therefore was repeated and demonstrated left P2 occlusion for which risks and benefits of thrombectomy were discussed with wife.  She agreed that he would want thrombectomy and patient also assented.  MRI  Acute left PCA territory infarcts, described above. Associated edema and petechial hemorrhage without mass occupying acute hemorrhage or significant mass effect. 2D Echo 60-65%, no thrombus or shunt identified Recommend Loop recorder  LDL 128 HgbA1c 5.9 VTE prophylaxis - Recommended patient  Patient she is being.  Patient is stable for night she  REVIEW OF SYSTEMS: Out of a complete 14 system review of symptoms, the patient complains only of the following symptoms, and all other reviewed systems are negative.  ALLERGIES: Allergies  Allergen Reactions   Other Other (See Comments)    NO NARCOTICS - per pt's wife    HOME MEDICATIONS: Outpatient Medications Prior to Visit  Medication Sig Dispense Refill   aspirin 81 MG chewable tablet Chew 1 tablet (81 mg total) by mouth daily. 30 tablet 12   atorvastatin (LIPITOR) 80 MG tablet Take 1 tablet (80 mg total) by mouth daily. 30 tablet 0   erythromycin ophthalmic ointment Place a 1/2 inch ribbon of ointment into the right lower eyelid up to four (4) times for the next five (5) days. 3.5  g 0   fluticasone (FLONASE) 50 MCG/ACT nasal spray Place 1 spray into both nostrils daily. 9.9 mL 0   methocarbamol (ROBAXIN) 500 MG tablet Take 1 tablet (500 mg total) by mouth 2 (two) times daily. 20 tablet 0   Multiple Vitamins-Minerals (MULTIVITAMIN MEN 50+) TABS Take 1 tablet by mouth daily.     promethazine-dextromethorphan (PROMETHAZINE-DM) 6.25-15 MG/5ML syrup Take 5 mLs by mouth 4 (four) times daily as needed for cough. 118 mL 0   ticagrelor (BRILINTA) 90 MG TABS tablet Take 1 tablet (90 mg total) by mouth 2 (two) times daily.  60 tablet 0   No facility-administered medications prior to visit.    PAST MEDICAL HISTORY: Past Medical History:  Diagnosis Date   Stroke [facility])     PAST SURGICAL HISTORY: Past Surgical History:  Procedure Laterality Date   IR ANGIO INTRA EXTRACRAN SEL INTERNAL CAROTID BILAT MOD SED  10/11/2022   IR ANGIO VERTEBRAL SEL VERTEBRAL BILAT MOD SED  10/11/2022   IR ANGIO VERTEBRAL SEL VERTEBRAL UNI L MOD SED  04/03/2022   IR CT HEAD LTD  04/02/2022   IR CT HEAD LTD  04/02/2022   IR PERCUTANEOUS ART THROMBECTOMY/INFUSION INTRACRANIAL INC DIAG ANGIO  04/01/2022   IR PERCUTANEOUS ART THROMBECTOMY/INFUSION INTRACRANIAL INC DIAG ANGIO  04/02/2022   IR US GUIDE VASC ACCESS RIGHT  04/01/2022   IR US GUIDE VASC ACCESS RIGHT  04/02/2022   IR US GUIDE VASC ACCESS RIGHT  10/11/2022   LOOP RECORDER INSERTION N/A 04/04/2022   Procedure: LOOP RECORDER INSERTION;  Surgeon: Lanier Prude, MD;  Location: [facility] INVASIVE CV LAB;  Service: Cardiovascular;  Laterality: N/A;   RADIOLOGY WITH ANESTHESIA N/A 04/01/2022   Procedure: RADIOLOGY WITH ANESTHESIA;  Surgeon: Radiologist, Medication, MD;  Location: [facility] OR;  Service: Radiology;  Laterality: N/A;    FAMILY HISTORY: No family history on file.  SOCIAL HISTORY: Social History   Socioeconomic History   Marital status: Married    Spouse name: Not on file   Number of children: Not on file   Years of education: Not on file   Highest education level: Not on file  Occupational History   Not on file  Tobacco Use   Smoking status: Never   Smokeless tobacco: Never  Vaping Use   Vaping status: Never Used  Substance and Sexual Activity   Alcohol use: No   Drug use: Never   Sexual activity: Not Currently  Other Topics Concern   Not on file  Social History Narrative   Not on file   Social Determinants of Health   Financial Resource Strain: Not on file  Food Insecurity: Not on file  Transportation Needs: Not on file  Physical Activity: Not on file   Stress: Not on file  Social Connections: Not on file  Intimate Partner Violence: Not on file      PHYSICAL EXAM  There were no vitals filed for this visit. There is no height or weight on file to calculate BMI.  Generalized: Well developed, in no acute distress   Neurological examination  Mentation: Alert oriented to time, place, history taking. Follows all commands speech and language fluent Cranial nerve II-XII: Pupils were equal round reactive to light. Extraocular movements were full, visual field were full on confrontational test. Facial sensation and strength were normal. Uvula tongue midline. Head turning and shoulder shrug  were normal and symmetric. Motor: The motor testing reveals 5 over 5 strength of all 4 extremities. Good symmetric motor tone is  noted throughout.  Sensory: Sensory testing is intact to soft touch on all 4 extremities. No evidence of extinction is noted.  Coordination: Cerebellar testing reveals good finger-nose-finger and heel-to-shin bilaterally.  Gait and station: Gait is normal. Tandem gait is normal. Romberg is negative. No drift is seen.  Reflexes: Deep tendon reflexes are symmetric and normal bilaterally.   DIAGNOSTIC DATA (LABS, IMAGING, TESTING) - I reviewed patient records, labs, notes, testing and imaging myself where available.  Lab Results  Component Value Date   WBC 6.6 05/02/2023   HGB 15.4 05/02/2023   HCT 46.9 05/02/2023   MCV 90.2 05/02/2023   PLT 212 05/02/2023      Component Value Date/Time   NA 136 05/02/2023 1945   K 3.6 05/02/2023 1945   CL 102 05/02/2023 1945   CO2 24 05/02/2023 1945   GLUCOSE 125 (H) 05/02/2023 1945   BUN 12 05/02/2023 1945   CREATININE 1.44 (H) 05/02/2023 1945   CALCIUM 9.0 05/02/2023 1945   PROT 7.5 05/02/2023 1945   ALBUMIN 3.9 05/02/2023 1945   AST 25 05/02/2023 1945   ALT 25 05/02/2023 1945   ALKPHOS 60 05/02/2023 1945   BILITOT 1.2 05/02/2023 1945   GFRNONAA 53 (L) 05/02/2023 1945   GFRAA  >60 09/15/2019 2141   Lab Results  Component Value Date   CHOL 251 (H) 04/02/2022   HDL 65 04/02/2022   LDLCALC 128 (H) 04/02/2022   TRIG 292 (H) 04/02/2022   CHOLHDL 3.9 04/02/2022   Lab Results  Component Value Date   HGBA1C 5.9 (H) 04/01/2022      ASSESSMENT AND PLAN 67 y.o. year old male  has a past medical history of Stroke (HCC). here with:  LEFT PCA infarct   Continue aspirin 81 mg daily and Brilinta (ticagrelor) 90 mg bid   for secondary stroke prevention.  Discussed secondary stroke prevention measures and importance of close PCP follow up for aggressive stroke risk factor management. I have gone over the pathophysiology of stroke, warning signs and symptoms, risk factors and their management in some detail with instructions to go to the closest emergency room for symptoms of concern. HTN: BP goal <130/90.  Will keep a BP log for his PCP to see in Sept. HLD: LDL goal <70. Recent LDL 128 on lipitor 80 mg daily DMII: A1c goal<7.0. Recent A1c 5.9.  Encouraged patient to monitor diet and encouraged exercise Will discuss with OT about return to work date Okay to resume driving FU with our office PRN  Butch Penny, MSN, NP-C 05/26/2023, 11:42 AM [facility] [address] [place], [place] [address] [phone]

## 2023-05-27 ENCOUNTER — Ambulatory Visit (INDEPENDENT_AMBULATORY_CARE_PROVIDER_SITE_OTHER): Payer: Managed Care, Other (non HMO) | Admitting: Adult Health

## 2023-05-27 ENCOUNTER — Encounter: Payer: Self-pay | Admitting: Adult Health

## 2023-05-27 VITALS — BP 127/82 | HR 75 | Ht 67.0 in | Wt 177.0 lb

## 2023-05-27 DIAGNOSIS — I63432 Cerebral infarction due to embolism of left posterior cerebral artery: Secondary | ICD-10-CM | POA: Diagnosis not present

## 2023-06-16 ENCOUNTER — Ambulatory Visit: Payer: Managed Care, Other (non HMO)

## 2023-06-17 ENCOUNTER — Telehealth: Payer: Self-pay | Admitting: Cardiology

## 2023-06-17 NOTE — Telephone Encounter (Signed)
  1. Has your device fired? no  2. Is you device beeping? no  3. Are you experiencing draining or swelling at device site?   4. Are you calling to see if we received your device transmission? He says his device is not pairing with his box  5. Have you passed out?  no   Please route to Device Clinic Pool

## 2023-06-19 NOTE — Telephone Encounter (Signed)
LMOVM for pt to call us back.

## 2023-06-23 NOTE — Telephone Encounter (Signed)
Follow Up:    Patient says his device is still not doing what it needs to do.

## 2023-06-24 NOTE — Telephone Encounter (Signed)
I spoke with the pt and he let me know that he called tech support and the app is now working.

## 2023-07-17 ENCOUNTER — Ambulatory Visit (INDEPENDENT_AMBULATORY_CARE_PROVIDER_SITE_OTHER): Payer: Managed Care, Other (non HMO)

## 2023-07-17 DIAGNOSIS — I63532 Cerebral infarction due to unspecified occlusion or stenosis of left posterior cerebral artery: Secondary | ICD-10-CM

## 2023-07-17 LAB — CUP PACEART REMOTE DEVICE CHECK
Date Time Interrogation Session: 20241031021501
Implantable Pulse Generator Implant Date: 20230720
Pulse Gen Serial Number: 511012320

## 2023-08-04 NOTE — Progress Notes (Signed)
[facility] Summary Report / Loop Recorder 

## 2023-08-17 LAB — CUP PACEART REMOTE DEVICE CHECK
Date Time Interrogation Session: 20241201020146
Implantable Pulse Generator Implant Date: 20230720
Pulse Gen Serial Number: 511012320

## 2023-08-18 ENCOUNTER — Ambulatory Visit (INDEPENDENT_AMBULATORY_CARE_PROVIDER_SITE_OTHER): Payer: Managed Care, Other (non HMO)

## 2023-08-18 DIAGNOSIS — I63532 Cerebral infarction due to unspecified occlusion or stenosis of left posterior cerebral artery: Secondary | ICD-10-CM | POA: Diagnosis not present

## 2023-09-18 ENCOUNTER — Ambulatory Visit: Payer: Managed Care, Other (non HMO)

## 2023-09-18 DIAGNOSIS — I63532 Cerebral infarction due to unspecified occlusion or stenosis of left posterior cerebral artery: Secondary | ICD-10-CM | POA: Diagnosis not present

## 2023-09-18 LAB — CUP PACEART REMOTE DEVICE CHECK
Date Time Interrogation Session: 20250102021447
Implantable Pulse Generator Implant Date: 20230720
Pulse Gen Serial Number: 511012320

## 2023-09-29 ENCOUNTER — Telehealth: Payer: Self-pay | Admitting: Adult Health

## 2023-09-29 NOTE — Telephone Encounter (Signed)
 Pt stopped in this am to see if he is ok to discontinue all his meds with the exception of the aspirin. Also mentioned that is right arm from elbow to shoulder feels like theres styrofoam in it.

## 2023-09-29 NOTE — Telephone Encounter (Addendum)
 I called pt and he stated he is wanting to know how long he needs to be on Brilinta  and aspirin .  I told him that per last office note he was to see interventional radiology Dr. Dolphus about the ongoing use of brilinta  and aspirin .   He could not take # at this time.  I told him I will call again and leave numbers that he can call and check with them.  # Rosina [phone], and delon [phone],  he may also check with pcp.  I asked if there was any other issue and he stated not that was what it was all about.

## 2023-09-30 ENCOUNTER — Other Ambulatory Visit (HOSPITAL_COMMUNITY): Payer: Self-pay | Admitting: Neuroradiology

## 2023-09-30 DIAGNOSIS — I63532 Cerebral infarction due to unspecified occlusion or stenosis of left posterior cerebral artery: Secondary | ICD-10-CM

## 2023-10-13 ENCOUNTER — Ambulatory Visit (HOSPITAL_COMMUNITY)
Admission: RE | Admit: 2023-10-13 | Discharge: 2023-10-13 | Disposition: A | Discharge status: Home / Self Care | Payer: Managed Care, Other (non HMO) | Source: Ambulatory Visit | Attending: Neuroradiology | Admitting: Neuroradiology

## 2023-10-13 DIAGNOSIS — I63532 Cerebral infarction due to unspecified occlusion or stenosis of left posterior cerebral artery: Secondary | ICD-10-CM | POA: Diagnosis present

## 2023-10-13 MED ORDER — IOHEXOL 350 MG/ML SOLN
100.0000 mL | Freq: Once | INTRAVENOUS | Status: AC | PRN
  Administered 2023-10-13: 100 mL via INTRAVENOUS

## 2023-10-16 ENCOUNTER — Other Ambulatory Visit (HOSPITAL_COMMUNITY): Payer: Self-pay | Admitting: Neuroradiology

## 2023-10-16 ENCOUNTER — Telehealth (HOSPITAL_COMMUNITY): Payer: Self-pay

## 2023-10-16 DIAGNOSIS — I63532 Cerebral infarction due to unspecified occlusion or stenosis of left posterior cerebral artery: Secondary | ICD-10-CM

## 2023-10-16 NOTE — Telephone Encounter (Signed)
Called to schedule consult, no answer, left vm. AB

## 2023-10-20 ENCOUNTER — Ambulatory Visit (INDEPENDENT_AMBULATORY_CARE_PROVIDER_SITE_OTHER): Payer: Managed Care, Other (non HMO)

## 2023-10-20 ENCOUNTER — Ambulatory Visit (HOSPITAL_COMMUNITY)
Admission: RE | Admit: 2023-10-20 | Discharge: 2023-10-20 | Disposition: A | Discharge status: Home / Self Care | Payer: Managed Care, Other (non HMO) | Source: Ambulatory Visit | Attending: Neuroradiology | Admitting: Neuroradiology

## 2023-10-20 DIAGNOSIS — I63532 Cerebral infarction due to unspecified occlusion or stenosis of left posterior cerebral artery: Secondary | ICD-10-CM | POA: Diagnosis not present

## 2023-10-20 NOTE — Progress Notes (Signed)
Referring Physician(s): de Macedo Rodrigues,Dariane Natzke  Chief Complaint: The patient is seen in follow up today s/p cerebral angiogram to evaluate cervical left ICA filling defect.   History of present illness:  Vincent Oconnell is a 68 year old male who presented to the emergency room on 04/01/2022 with dizziness, right hemianopia, dysarthria and mild right-sided weakness. His NIHSS was fluctuating between 3 and 12. He was taken for mechanical thrombectomy with recanalization of a fetal left P2/PCA. He responded well to treatment with significant improvement of symptoms. However, around 9 p.m. on the same day, he had a sudden neurological decline with right upper extremity weakness and vision loss. Repeat CT angiogram of the head and neck showed reocclusion of the left P2/PCA segment.  He again underwent mechanical thrombectomy with rescue left PCA stent placement.    On October 11, 2022, he underwent a cerebral angiogram to evaluate stent patency.  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  He was due for follow-up imaging to decide on need for continued dual anti-platelet therapy.  However, on 04/21/2023, he presented to the emergency room for severe left-sided neck pain.  He tried massage therapy without success prior to presenting to the hospital.  Due to his prior history of stroke, a CT angiogram of the head and neck was obtained.  It showed a small filling defect within the mid cervical left ICA concerning for nonocclusive intraluminal thrombus without significant stenosis.  On my independent review, a subtle luminal irregularity of the left ICA is also seen at this level, in addition to increased tortuosity, which could indicate fibromuscular dysplasia.  Review of prior angiograms showed that this irregularity was present before.  He continued  taking aspirin and brilinta due to the concern for possible intraluminal thrombus.   UPDATE: Vincent Oconnell recently underwent a CTA of the head and neck to evaluate the left ICA filling defect. He comes today to discuss imaging findings.   He has been doing well, complaining only of stiffness on the left side of his neck and paresthesias in his 4th and 5th finger tips.    Past Medical History:  Diagnosis Date   Stroke [facility])     Past Surgical History:  Procedure Laterality Date   IR ANGIO INTRA EXTRACRAN SEL INTERNAL CAROTID BILAT MOD SED  10/11/2022   IR ANGIO VERTEBRAL SEL VERTEBRAL BILAT MOD SED  10/11/2022   IR ANGIO VERTEBRAL SEL VERTEBRAL UNI L MOD SED  04/03/2022   IR CT HEAD LTD  04/02/2022   IR CT HEAD LTD  04/02/2022   IR PERCUTANEOUS ART THROMBECTOMY/INFUSION INTRACRANIAL INC DIAG ANGIO  04/01/2022   IR PERCUTANEOUS ART THROMBECTOMY/INFUSION INTRACRANIAL INC DIAG ANGIO  04/02/2022   IR US GUIDE VASC ACCESS RIGHT  04/01/2022   IR US GUIDE VASC ACCESS RIGHT  04/02/2022   IR US GUIDE VASC ACCESS RIGHT  10/11/2022   LOOP RECORDER INSERTION N/A 04/04/2022   Procedure: LOOP RECORDER INSERTION;  Surgeon: Lanier Prude, MD;  [facility]: [facility] IN[facility];  Service: Cardiovascular;  Laterality: N/A;   RADIOLOGY WITH ANESTHESIA N/A 04/01/2022   Procedure: RADIOLOGY WITH ANESTHESIA;  Surgeon: Radiologist, Medication, MD;  Location: [facility] OR;  Service: Radiology;  Laterality: N/A;    Allergies: Other  Medications: Prior to Admission medications   Medication Sig Start Date End Date Taking? Authorizing Provider  aspirin 81 MG chewable tablet  Chew 1 tablet (81 mg total) by mouth daily. 04/05/22   Mathews Argyle, NP  atorvastatin (LIPITOR) 80 MG tablet Take 1 tablet (80 mg total) by mouth daily. 04/05/22   Mathews Argyle, NP  fluticasone (FLONASE) 50 MCG/ACT nasal spray Place 1 spray into both nostrils daily. 05/04/23   Ward, Tylene Fantasia, PA-C  methocarbamol (ROBAXIN) 500 MG tablet Take 1 tablet (500  mg total) by mouth 2 (two) times daily. 04/21/23   Carroll Sage, PA-C  Multiple Vitamins-Minerals (MULTIVITAMIN MEN 50+) TABS Take 1 tablet by mouth daily.    [provider]  promethazine-dextromethorphan (PROMETHAZINE-DM) 6.25-15 MG/5ML syrup Take 5 mLs by mouth 4 (four) times daily as needed for cough. 05/04/23   Ward, Tylene Fantasia, PA-C  ticagrelor (BRILINTA) 90 MG TABS tablet Take 1 tablet (90 mg total) by mouth 2 (two) times daily. 04/04/22   Mathews Argyle, NP     Family History  Problem Relation Age of Onset   Stroke Mother    Stroke Father    Stroke Sister     Social History   Socioeconomic History   Marital status: Married    Spouse name: Not on file   Number of children: Not on file   Years of education: Not on file   Highest education level: Not on file  Occupational History   Not on file  Tobacco Use   Smoking status: Never   Smokeless tobacco: Never  Vaping Use   Vaping status: Never Used  Substance and Sexual Activity   Alcohol use: No   Drug use: Never   Sexual activity: Not Currently  Other Topics Concern   Not on file  Social History Narrative   Not on file   Social Drivers of Health   Financial Resource Strain: Not on file  Food Insecurity: Not on file  Transportation Needs: Not on file  Physical Activity: Not on file  Stress: Not on file  Social Connections: Not on file     Vital Signs: There were no vitals taken for this visit.  Physical Exam Constitutional:      Appearance: Normal appearance.  HENT:     Mouth/Throat:     Mouth: Mucous membranes are moist.     Pharynx: Oropharynx is clear.  Eyes:     Extraocular Movements: Extraocular movements intact.     Conjunctiva/sclera: Conjunctivae normal.     Pupils: Pupils are equal, round, and reactive to light.  Neurological:     Mental Status: He is alert and oriented to person, place, and time.     Cranial Nerves: Cranial nerves 2-12 are intact.     Motor: Motor function is  intact.     Coordination: Coordination is intact.     Gait: Gait is intact.     Imaging: CLINICAL DATA:  Acute left PCA stroke   EXAM: CT ANGIOGRAPHY HEAD AND NECK WITH AND WITHOUT CONTRAST   TECHNIQUE: Multidetector CT imaging of the head and neck was performed using the standard protocol during bolus administration of intravenous contrast. Multiplanar CT image reconstructions and MIPs were obtained to evaluate the vascular anatomy. Carotid stenosis measurements (when applicable) are obtained utilizing NASCET criteria, using the distal internal carotid diameter as the denominator.   RADIATION DOSE REDUCTION: This exam was performed according to the departmental dose-optimization program which includes automated exposure control, adjustment of the mA and/or kV according to patient size and/or use of iterative reconstruction technique.   CONTRAST:  OMNIPAQUE IOHEXOL 350  MG/ML SOLN   COMPARISON:  CT head with CT angiogram head and neck 04/20/2023.   FINDINGS: Unenhanced Head:   Old left PCA territory infarct is unchanged significantly in appearance.   No intraparenchymal hemorrhage.   No extra-axial hemorrhage.   No midline shift.   Ventricular system normal in size and configuration.   No convincing evidence for an acute territorial infarct.   No fracture.   Imaged paranasal sinuses and mastoid air cells are clear.   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   Carotid arteries:   Left CCA: Patent to its bifurcation.   Left ICA: No hemodynamically significant stenosis to skull base using NASCET criteria. Small filling defect in the left ICA on image 178, series 7 is unchanged significantly and remains most consistent with focal thrombus.   Right CCA: Patent to its bifurcation.   Right  ICA: No hemodynamically significant stenosis to skull base using NASCET criteria.   Vertebral arteries:   Left: Patent to skull base.   Right: Patent to skull base.   CTA Head:   Anterior circulation:   Carotid siphons: No hemodynamically significant stenosis.   Anterior cerebral arteries: No hemodynamically significant stenosis.   Middle cerebral arteries: No hemodynamically significant stenosis.   Posterior circulation:   Vertebral arteries: No hemodynamically significant stenosis.   Basilar artery: There is similar short-segment mild stenosis of the mid basilar artery.   Posterior cerebral arteries: No hemodynamically significant stenosis. Fetal type left PCA. Patent right posterior communicating artery. Left PCA stent redemonstrated within P2 segment of left PCA. There is new short segment stenosis of mid P2 segment of left PCA which is best seen on image 25, series 10.   IMPRESSION: UNENHANCED CT HEAD:   *Old left PCA territory infarct is redemonstrated and unchanged significantly in appearance. *No acute intracranial abnormality.   CTA HEAD AND NECK:   *Vascular stent graft is again noted within P2 segment of left PCA. There is new short segment stenosis of mid left P2 segment of left PCA on image 25, series 10. 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 *Stable small filling defect in the left ICA which remains most consistent with focal thrombus.     Electronically Signed   By: Ala Bent M.D.   On: 10/15/2023 17:19    Labs:  CBC: Recent Labs    04/20/23 1755 05/02/23 1945  WBC  --  6.6  HGB 15.3 15.4  HCT 45.0 46.9  PLT  --  212    COAGS: No results for input(s): "INR", "APTT" in the last 8760 hours.  BMP: Recent Labs    04/20/23 1755 05/02/23 1945  NA 138 136  K 3.8 3.6  CL 102 102  CO2  --  24  GLUCOSE 93 125*  BUN 15 12  CALCIUM  --  9.0  CREATININE 1.30* 1.44*  GFRNONAA  --  53*    LIVER FUNCTION  TESTS: Recent Labs    05/02/23 1945  BILITOT 1.2  AST 25  ALT 25  ALKPHOS 60  PROT 7.5  ALBUMIN 3.9    Assessment:  There has been no significant interval change of the tiny cervical left ICA filling defect. Upon further review, I believe this was present since his first CTA in July 2023 tough less evident, as seen in the composed image below:  From left to right: CTA  04/01/2022, DSA 10/11/2022, CTA 04/20/2023 and CTA 10/13/2023.   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 It would be very unlikely that a small floating thrombus would persist in the same location for 1.5 years despite DAPT. It is unclear if that could be implicated on his stroke in July 2023. At this point, we will discontinue the brilinta while maintaining daily aspirin 81 mg for life. Vincent Oconnell and his wife understand and agree with the plan. We will obtain a CTA of the head and neck in 6 months for monitoring after discontinuation of brilinta.  Signed: Baldemar Lenis, MD 10/20/2023, 1:33 PM    I spent a total of  25 Minutes in face to face in clinical consultation, greater than 50% of which was counseling/coordinating care for left PCA territory stroke s/p mechanical thrombectomy.

## 2023-10-21 LAB — CUP PACEART REMOTE DEVICE CHECK
Date Time Interrogation Session: 20250203080135
Implantable Pulse Generator Implant Date: 20230720
Pulse Gen Serial Number: 511012320

## 2023-10-25 ENCOUNTER — Encounter: Payer: Self-pay | Admitting: Cardiology

## 2023-10-30 NOTE — Progress Notes (Signed)
[facility] Summary Report / Loop Recorder

## 2023-11-05 ENCOUNTER — Encounter (HOSPITAL_COMMUNITY): Payer: Self-pay

## 2023-11-05 HISTORY — PX: IR RADIOLOGIST EVAL & MGMT: IMG5224

## 2023-11-20 ENCOUNTER — Ambulatory Visit: Payer: Managed Care, Other (non HMO)

## 2023-11-20 DIAGNOSIS — I63532 Cerebral infarction due to unspecified occlusion or stenosis of left posterior cerebral artery: Secondary | ICD-10-CM

## 2023-11-22 LAB — CUP PACEART REMOTE DEVICE CHECK
Date Time Interrogation Session: 20250306020208
Implantable Pulse Generator Implant Date: 20230720
Pulse Gen Model: 5000
Pulse Gen Serial Number: 511012320

## 2023-11-27 ENCOUNTER — Encounter: Payer: Self-pay | Admitting: Cardiology

## 2023-11-27 NOTE — Addendum Note (Signed)
 Addended by: Geralyn Flash D on: 11/27/2023 10:54 AM   Modules accepted: Orders

## 2023-11-27 NOTE — Progress Notes (Signed)
 Merlin [facility] [employer]

## 2023-12-22 ENCOUNTER — Ambulatory Visit (INDEPENDENT_AMBULATORY_CARE_PROVIDER_SITE_OTHER): Payer: Managed Care, Other (non HMO)

## 2023-12-22 DIAGNOSIS — I63532 Cerebral infarction due to unspecified occlusion or stenosis of left posterior cerebral artery: Secondary | ICD-10-CM

## 2023-12-24 LAB — CUP PACEART REMOTE DEVICE CHECK
Date Time Interrogation Session: 20250407080046
Implantable Pulse Generator Implant Date: 20230720
Pulse Gen Model: 5000
Pulse Gen Serial Number: 511012320

## 2023-12-26 NOTE — Addendum Note (Signed)
 Addended by: Elease Etienne A on: 12/26/2023 02:03 PM   Modules accepted: Orders

## 2023-12-26 NOTE — Progress Notes (Signed)
 [facility] Summary Report / Loop Recorder

## 2023-12-27 ENCOUNTER — Encounter: Payer: Self-pay | Admitting: Cardiology

## 2024-01-22 ENCOUNTER — Ambulatory Visit (INDEPENDENT_AMBULATORY_CARE_PROVIDER_SITE_OTHER): Payer: Managed Care, Other (non HMO)

## 2024-01-22 DIAGNOSIS — I63532 Cerebral infarction due to unspecified occlusion or stenosis of left posterior cerebral artery: Secondary | ICD-10-CM | POA: Diagnosis not present

## 2024-01-22 LAB — CUP PACEART REMOTE DEVICE CHECK
Date Time Interrogation Session: 20250508090203
Implantable Pulse Generator Implant Date: 20230720
Pulse Gen Model: 5000
Pulse Gen Serial Number: 511012320

## 2024-01-25 ENCOUNTER — Encounter: Payer: Self-pay | Admitting: Cardiology

## 2024-02-11 NOTE — Addendum Note (Signed)
 Addended by: Edra Govern D on: 02/11/2024 03:34 PM   Modules accepted: Orders

## 2024-02-11 NOTE — Progress Notes (Signed)
 Merlin [facility] [employer]

## 2024-02-23 ENCOUNTER — Ambulatory Visit: Payer: Self-pay | Admitting: Cardiology

## 2024-02-23 ENCOUNTER — Ambulatory Visit (INDEPENDENT_AMBULATORY_CARE_PROVIDER_SITE_OTHER): Payer: Managed Care, Other (non HMO)

## 2024-02-23 DIAGNOSIS — I63532 Cerebral infarction due to unspecified occlusion or stenosis of left posterior cerebral artery: Secondary | ICD-10-CM | POA: Diagnosis not present

## 2024-02-23 LAB — CUP PACEART REMOTE DEVICE CHECK
Date Time Interrogation Session: 20250608054049
Implantable Pulse Generator Implant Date: 20230720
Pulse Gen Model: 5000
Pulse Gen Serial Number: 511012320

## 2024-02-28 NOTE — Progress Notes (Signed)
 [facility] Summary Report / Loop Recorder

## 2024-03-25 ENCOUNTER — Ambulatory Visit: Payer: Managed Care, Other (non HMO)

## 2024-03-25 DIAGNOSIS — I63532 Cerebral infarction due to unspecified occlusion or stenosis of left posterior cerebral artery: Secondary | ICD-10-CM

## 2024-03-25 LAB — CUP PACEART REMOTE DEVICE CHECK
Date Time Interrogation Session: 20250710050156
Implantable Pulse Generator Implant Date: 20230720
Pulse Gen Model: 5000
Pulse Gen Serial Number: 511012320

## 2024-03-27 ENCOUNTER — Ambulatory Visit: Payer: Self-pay | Admitting: Cardiology

## 2024-04-08 NOTE — Progress Notes (Signed)
 Merlin [facility] [employer]

## 2024-04-26 ENCOUNTER — Ambulatory Visit: Payer: Managed Care, Other (non HMO)

## 2024-04-26 DIAGNOSIS — I63532 Cerebral infarction due to unspecified occlusion or stenosis of left posterior cerebral artery: Secondary | ICD-10-CM

## 2024-04-26 LAB — CUP PACEART REMOTE DEVICE CHECK
Date Time Interrogation Session: 20250810053111
Implantable Pulse Generator Implant Date: 20230720
Pulse Gen Model: 5000
Pulse Gen Serial Number: 511012320

## 2024-04-27 ENCOUNTER — Ambulatory Visit: Payer: Self-pay | Admitting: Cardiology

## 2024-05-18 ENCOUNTER — Telehealth (HOSPITAL_COMMUNITY): Payer: Self-pay

## 2024-05-18 NOTE — Telephone Encounter (Signed)
Returned call, no answer, left vm. AB

## 2024-05-19 ENCOUNTER — Telehealth (HOSPITAL_COMMUNITY): Payer: Self-pay

## 2024-05-19 NOTE — Telephone Encounter (Signed)
 Pt called regarding f/u appt. Informed him that Dr. Dolphus is not here any longer. He would like to be referred to Dr. Lester. I've sent a message to Lauren to see about getting the scheduled. AB

## 2024-05-25 ENCOUNTER — Other Ambulatory Visit (HOSPITAL_COMMUNITY): Payer: Self-pay | Admitting: Radiology

## 2024-05-25 DIAGNOSIS — I771 Stricture of artery: Secondary | ICD-10-CM

## 2024-05-25 DIAGNOSIS — I63532 Cerebral infarction due to unspecified occlusion or stenosis of left posterior cerebral artery: Secondary | ICD-10-CM

## 2024-05-26 ENCOUNTER — Encounter: Payer: Self-pay | Admitting: Neuroradiology

## 2024-05-26 ENCOUNTER — Ambulatory Visit: Admitting: Neuroradiology

## 2024-05-26 VITALS — BP 151/108 | HR 75 | Ht 68.5 in | Wt 188.0 lb

## 2024-05-26 DIAGNOSIS — I63432 Cerebral infarction due to embolism of left posterior cerebral artery: Secondary | ICD-10-CM

## 2024-05-26 DIAGNOSIS — H53461 Homonymous bilateral field defects, right side: Secondary | ICD-10-CM

## 2024-05-26 DIAGNOSIS — Z1211 Encounter for screening for malignant neoplasm of colon: Secondary | ICD-10-CM | POA: Insufficient documentation

## 2024-05-26 DIAGNOSIS — Z95828 Presence of other vascular implants and grafts: Secondary | ICD-10-CM | POA: Diagnosis not present

## 2024-05-26 NOTE — Progress Notes (Addendum)
 I had the pleasure of seeing Mr. Ackert in the office today for follow-up of a stroke and left posterior cerebral artery stent placement.  April 01, 2022 he presented with right homonymous hemianopsia, some right-sided weakness and waxing and waning symptoms.  He had emergent imaging which demonstrated a perfusion abnormality in the left posterior cerebral artery territory without any infarction.  He was initially treated with thrombectomy (from the carotid artery because the left posterior cerebral artery arose from the carotid artery) and his symptoms improved.  Later that day he deteriorated.  He had reoccluded the artery and this was treated with placement of a Neuroform Atlas stent.  His MRI 04/02/2022 showed infarction in the left occipital lobe and posterior medial temporal lobe.  Also infarction in the left side of the thalamus.  I reviewed all of the prior imaging studies personally, including the CT and CT arteriogram studies from 04/01/2022, the MRI from 04/02/2022 and the thrombectomy procedures from 03/31/2022.  He had a loop recorder placed which has been monitored since 04/04/2022 with no arrhythmias.  He had an echocardiogram which I reviewed and shows no structural lesion.  There was no carotid stenosis on his CTAs.  Clinically he has recovered very well.  He has some numbness in the right 4th and 5th digits and the right 4th and 5th toes, and occasional spasms of the right foot.  He has a partial right homonymous hemianopsia.  He is returned to work full-time.  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  He was treated with aspirin  and ticagrelor .  The ticagrelor  has not been stopped as far as I can tell, but I am also not sure that he is still taking it.  He is also on high dose atorvastatin .  I reviewed his lipid studies from 2023 and his total cholesterol was 251 with an LDL of 128.  Assessment:  1.  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   This was removed with embolectomy using a stent retriever.  The artery initially looked good but then reoccluded, probably due to intimal injury, which was treated with stent placement. 2.  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 3.  An extensive workup for a embolic source has been negative, with 2 years of monitoring of a implantable loop recorder without evidence of an arrhythmia, and no carotid stenosis.  No structural lesion noted on echocardiography.  Recommendation:  1.  He can stop the ticagrelor . 2.  Should continue 81 mg aspirin  3.  No routine imaging follow-up needed of his stent unless he develops new neurologic symptoms. 4.  We talked about his hypertension.  This is asymptomatic.  He is between doctors.  I have talked to him about the importance of regular follow-up with a primary care physician for management of his blood pressure and other vascular risk factors.  He is going to find a doctor.

## 2024-05-27 ENCOUNTER — Ambulatory Visit: Payer: Managed Care, Other (non HMO)

## 2024-05-27 DIAGNOSIS — I63532 Cerebral infarction due to unspecified occlusion or stenosis of left posterior cerebral artery: Secondary | ICD-10-CM

## 2024-05-27 LAB — CUP PACEART REMOTE DEVICE CHECK
Date Time Interrogation Session: 20250910050259
Implantable Pulse Generator Implant Date: 20230720
Pulse Gen Model: 5000
Pulse Gen Serial Number: 511012320

## 2024-05-30 ENCOUNTER — Ambulatory Visit: Payer: Self-pay | Admitting: Cardiology

## 2024-06-03 NOTE — Progress Notes (Signed)
 Remote Loop Recorder Transmission

## 2024-06-11 NOTE — Progress Notes (Signed)
 Remote Loop Recorder Transmission

## 2024-06-24 NOTE — Progress Notes (Signed)
 Remote Loop Recorder Transmission

## 2024-06-28 ENCOUNTER — Ambulatory Visit: Payer: Managed Care, Other (non HMO)

## 2024-06-28 DIAGNOSIS — I63532 Cerebral infarction due to unspecified occlusion or stenosis of left posterior cerebral artery: Secondary | ICD-10-CM | POA: Diagnosis not present

## 2024-06-28 LAB — CUP PACEART REMOTE DEVICE CHECK
Date Time Interrogation Session: 20251013050159
Implantable Pulse Generator Implant Date: 20230720
Pulse Gen Model: 5000
Pulse Gen Serial Number: 511012320

## 2024-06-30 ENCOUNTER — Ambulatory Visit: Payer: Self-pay | Admitting: Cardiology

## 2024-06-30 NOTE — Progress Notes (Signed)
 Remote Loop Recorder Transmission

## 2024-07-29 ENCOUNTER — Ambulatory Visit: Payer: Managed Care, Other (non HMO)

## 2024-07-29 DIAGNOSIS — I63532 Cerebral infarction due to unspecified occlusion or stenosis of left posterior cerebral artery: Secondary | ICD-10-CM

## 2024-07-29 LAB — CUP PACEART REMOTE DEVICE CHECK
Date Time Interrogation Session: 20251113045618
Implantable Pulse Generator Implant Date: 20230720
Pulse Gen Model: 5000
Pulse Gen Serial Number: 511012320

## 2024-07-30 NOTE — Progress Notes (Signed)
 Remote Loop Recorder Transmission

## 2024-08-11 ENCOUNTER — Ambulatory Visit: Payer: Self-pay | Admitting: Cardiology

## 2024-08-30 ENCOUNTER — Ambulatory Visit: Payer: Managed Care, Other (non HMO)

## 2024-08-30 DIAGNOSIS — I63532 Cerebral infarction due to unspecified occlusion or stenosis of left posterior cerebral artery: Secondary | ICD-10-CM | POA: Diagnosis not present

## 2024-08-31 LAB — CUP PACEART REMOTE DEVICE CHECK
Date Time Interrogation Session: 20251214045650
Implantable Pulse Generator Implant Date: 20230720
Pulse Gen Model: 5000
Pulse Gen Serial Number: 511012320

## 2024-09-03 NOTE — Progress Notes (Signed)
 Remote Loop Recorder Transmission

## 2024-09-18 ENCOUNTER — Ambulatory Visit: Payer: Self-pay | Admitting: Cardiology

## 2024-09-21 NOTE — Progress Notes (Unsigned)
"       ° °  LILLETTE Ileana Collet, PhD, LAT, ATC acting as a scribe for Artist Lloyd, MD.  Vincent Oconnell is a 69 y.o. male who presents to [facility] today for L knee pain x ***. Pt locates pain to ***  L Knee swelling: Mechanical symptoms: Aggravates: Treatments tried:  Dx imaging: L knee XR---- done at [facility] office  Pertinent review of systems: ***  Relevant historical information: ***   Exam:  There were no vitals taken for this visit. General: Well Developed, well nourished, and in no acute distress.   MSK: ***    Lab and Radiology Results No results found for this or any previous visit (from the past 72 hours). No results found.     Assessment and Plan: 69 y.o. male with ***   PDMP not reviewed this encounter. No orders of the defined types were placed in this encounter.  No orders of the defined types were placed in this encounter.    Discussed warning signs or symptoms. Please see discharge instructions. Patient expresses understanding.   ***  "

## 2024-09-22 ENCOUNTER — Ambulatory Visit: Admitting: Family Medicine

## 2024-09-22 ENCOUNTER — Other Ambulatory Visit: Payer: Self-pay

## 2024-09-22 VITALS — BP 192/102 | HR 70 | Ht 68.5 in | Wt 187.0 lb

## 2024-09-22 DIAGNOSIS — G8929 Other chronic pain: Secondary | ICD-10-CM

## 2024-09-22 DIAGNOSIS — M25562 Pain in left knee: Secondary | ICD-10-CM

## 2024-09-22 DIAGNOSIS — M1712 Unilateral primary osteoarthritis, left knee: Secondary | ICD-10-CM | POA: Diagnosis not present

## 2024-09-22 NOTE — Patient Instructions (Addendum)
 Thank you for coming in today.   You received an injection today. Seek immediate medical attention if the joint becomes red, extremely painful, or is oozing fluid.   Please use Voltaren gel (Generic Diclofenac Gel) up to 4x daily for pain as needed.  This is available over-the-counter as both the name brand Voltaren gel and the generic diclofenac gel.   Work on dance movement psychotherapist  Continue knee compression sleeve  If the shot doesn't last at least 3 months, let me know

## 2024-09-30 ENCOUNTER — Ambulatory Visit (INDEPENDENT_AMBULATORY_CARE_PROVIDER_SITE_OTHER): Payer: Managed Care, Other (non HMO)

## 2024-09-30 DIAGNOSIS — I63532 Cerebral infarction due to unspecified occlusion or stenosis of left posterior cerebral artery: Secondary | ICD-10-CM | POA: Diagnosis not present

## 2024-09-30 LAB — CUP PACEART REMOTE DEVICE CHECK
Date Time Interrogation Session: 20260115053910
Implantable Pulse Generator Implant Date: 20230720
Pulse Gen Model: 5000
Pulse Gen Serial Number: 511012320

## 2024-10-01 ENCOUNTER — Ambulatory Visit: Payer: Self-pay | Admitting: Cardiology

## 2024-10-01 NOTE — Progress Notes (Signed)
 Remote Loop Recorder Transmission

## 2024-11-01 ENCOUNTER — Ambulatory Visit: Payer: Managed Care, Other (non HMO)

## 2024-12-02 ENCOUNTER — Ambulatory Visit: Payer: Managed Care, Other (non HMO)

## 2025-01-03 ENCOUNTER — Ambulatory Visit: Payer: Managed Care, Other (non HMO)

## 2025-02-03 ENCOUNTER — Ambulatory Visit: Payer: Managed Care, Other (non HMO)

## 2025-03-07 ENCOUNTER — Ambulatory Visit: Payer: Managed Care, Other (non HMO)

## 2025-04-07 ENCOUNTER — Ambulatory Visit: Payer: Managed Care, Other (non HMO)
